# Patient Record
Sex: Female | Born: 1975
Health system: Southern US, Community
[De-identification: ages and names within clinical notes are randomized; demographics above are authoritative.]

## PROBLEM LIST (undated history)

## (undated) DIAGNOSIS — N939 Abnormal uterine and vaginal bleeding, unspecified: Secondary | ICD-10-CM

## (undated) DIAGNOSIS — J189 Pneumonia, unspecified organism: Secondary | ICD-10-CM

## (undated) DIAGNOSIS — B20 Human immunodeficiency virus [HIV] disease: Secondary | ICD-10-CM

## (undated) DIAGNOSIS — R7303 Prediabetes: Secondary | ICD-10-CM

## (undated) DIAGNOSIS — D649 Anemia, unspecified: Secondary | ICD-10-CM

## (undated) DIAGNOSIS — Z21 Asymptomatic human immunodeficiency virus [HIV] infection status: Secondary | ICD-10-CM

## (undated) DIAGNOSIS — N9489 Other specified conditions associated with female genital organs and menstrual cycle: Secondary | ICD-10-CM

## (undated) HISTORY — DX: Other specified conditions associated with female genital organs and menstrual cycle: N94.89

## (undated) HISTORY — DX: Asymptomatic human immunodeficiency virus (hiv) infection status: Z21

## (undated) HISTORY — DX: Prediabetes: R73.03

## (undated) HISTORY — DX: Abnormal uterine and vaginal bleeding, unspecified: N93.9

## (undated) HISTORY — PX: ECTOPIC PREGNANCY SURGERY: SHX613

## (undated) HISTORY — DX: Human immunodeficiency virus (HIV) disease: B20

## (undated) HISTORY — PX: TUBAL LIGATION: SHX77

---

## 2018-08-20 ENCOUNTER — Ambulatory Visit: Payer: Self-pay

## 2018-08-20 ENCOUNTER — Other Ambulatory Visit: Payer: Self-pay

## 2018-08-22 ENCOUNTER — Other Ambulatory Visit: Payer: Self-pay

## 2018-08-22 ENCOUNTER — Ambulatory Visit: Payer: Self-pay

## 2018-08-22 DIAGNOSIS — Z79899 Other long term (current) drug therapy: Secondary | ICD-10-CM

## 2018-08-22 DIAGNOSIS — Z113 Encounter for screening for infections with a predominantly sexual mode of transmission: Secondary | ICD-10-CM

## 2018-08-22 DIAGNOSIS — B20 Human immunodeficiency virus [HIV] disease: Secondary | ICD-10-CM

## 2018-08-27 ENCOUNTER — Other Ambulatory Visit: Payer: Self-pay

## 2018-08-27 ENCOUNTER — Ambulatory Visit: Payer: Self-pay

## 2018-08-27 DIAGNOSIS — B20 Human immunodeficiency virus [HIV] disease: Secondary | ICD-10-CM

## 2018-08-27 DIAGNOSIS — Z79899 Other long term (current) drug therapy: Secondary | ICD-10-CM

## 2018-08-27 DIAGNOSIS — Z113 Encounter for screening for infections with a predominantly sexual mode of transmission: Secondary | ICD-10-CM

## 2018-08-28 LAB — T-HELPER CELL (CD4) - (RCID CLINIC ONLY)
CD4 % Helper T Cell: 12 % — ABNORMAL LOW (ref 33–55)
CD4 T Cell Abs: 380 /uL — ABNORMAL LOW (ref 400–2700)

## 2018-08-29 ENCOUNTER — Encounter: Payer: Self-pay | Admitting: Family

## 2018-09-05 LAB — CBC WITH DIFFERENTIAL/PLATELET
Absolute Monocytes: 270 cells/uL (ref 200–950)
Basophils Absolute: 20 cells/uL (ref 0–200)
Basophils Relative: 0.4 %
Eosinophils Absolute: 39 cells/uL (ref 15–500)
Eosinophils Relative: 0.8 %
HCT: 34.9 % — ABNORMAL LOW (ref 35.0–45.0)
Hemoglobin: 12 g/dL (ref 11.7–15.5)
Lymphs Abs: 3210 cells/uL (ref 850–3900)
MCH: 29.7 pg (ref 27.0–33.0)
MCHC: 34.4 g/dL (ref 32.0–36.0)
MCV: 86.4 fL (ref 80.0–100.0)
MPV: 11.6 fL (ref 7.5–12.5)
Monocytes Relative: 5.5 %
Neutro Abs: 1362 cells/uL — ABNORMAL LOW (ref 1500–7800)
Neutrophils Relative %: 27.8 %
Platelets: 169 10*3/uL (ref 140–400)
RBC: 4.04 10*6/uL (ref 3.80–5.10)
RDW: 13.2 % (ref 11.0–15.0)
Total Lymphocyte: 65.5 %
WBC: 4.9 10*3/uL (ref 3.8–10.8)

## 2018-09-05 LAB — QUANTIFERON-TB GOLD PLUS
Mitogen-NIL: 6.71 IU/mL
NIL: 0.12 IU/mL
QuantiFERON-TB Gold Plus: NEGATIVE
TB1-NIL: 0.03 IU/mL
TB2-NIL: 0.05 IU/mL

## 2018-09-05 LAB — COMPLETE METABOLIC PANEL WITH GFR
AG Ratio: 0.9 (calc) — ABNORMAL LOW (ref 1.0–2.5)
ALBUMIN MSPROF: 3.5 g/dL — AB (ref 3.6–5.1)
ALKALINE PHOSPHATASE (APISO): 49 U/L (ref 31–125)
ALT: 19 U/L (ref 6–29)
AST: 24 U/L (ref 10–30)
BUN: 8 mg/dL (ref 7–25)
CHLORIDE: 107 mmol/L (ref 98–110)
CO2: 26 mmol/L (ref 20–32)
Calcium: 9 mg/dL (ref 8.6–10.2)
Creat: 0.86 mg/dL (ref 0.50–1.10)
GFR, Est African American: 96 mL/min/{1.73_m2} (ref >=60)
GFR, Est Non African American: 83 mL/min/{1.73_m2} (ref >=60)
Globulin: 3.7 g/dL (calc) (ref 1.9–3.7)
Glucose, Bld: 94 mg/dL (ref 65–99)
Potassium: 3.7 mmol/L (ref 3.5–5.3)
Sodium: 140 mmol/L (ref 135–146)
Total Bilirubin: 0.3 mg/dL (ref 0.2–1.2)
Total Protein: 7.2 g/dL (ref 6.1–8.1)

## 2018-09-05 LAB — HLA B*5701: HLA-B*5701 w/rflx HLA-B High: NEGATIVE

## 2018-09-05 LAB — LIPID PANEL
Cholesterol: 180 mg/dL (ref <200)
HDL: 38 mg/dL — ABNORMAL LOW (ref >=50)
LDL Cholesterol (Calc): 105 mg/dL (calc) — ABNORMAL HIGH
Non-HDL Cholesterol (Calc): 142 mg/dL (calc) — ABNORMAL HIGH (ref <130)
Total CHOL/HDL Ratio: 4.7 (calc) (ref <5.0)
Triglycerides: 243 mg/dL — ABNORMAL HIGH (ref <150)

## 2018-09-05 LAB — RPR: RPR Ser Ql: NONREACTIVE

## 2018-09-05 LAB — HEPATITIS C ANTIBODY
Hepatitis C Ab: NONREACTIVE
SIGNAL TO CUT-OFF: 0.11 (ref <1.00)

## 2018-09-05 LAB — HIV-1/2 AB - DIFFERENTIATION
HIV 1 ANTIBODY: POSITIVE — AB
HIV-2 Ab: NEGATIVE

## 2018-09-05 LAB — HIV ANTIBODY (ROUTINE TESTING W REFLEX): HIV 1&2 Ab, 4th Generation: REACTIVE — AB

## 2018-09-05 LAB — HIV-1 RNA ULTRAQUANT REFLEX TO GENTYP+
HIV 1 RNA Quant: 214000 copies/mL — ABNORMAL HIGH
HIV-1 RNA Quant, Log: 5.33 Log copies/mL — ABNORMAL HIGH

## 2018-09-05 LAB — HEPATITIS B CORE ANTIBODY, TOTAL: Hep B Core Total Ab: NONREACTIVE

## 2018-09-05 LAB — HEPATITIS A ANTIBODY, TOTAL: HEPATITIS A AB,TOTAL: NONREACTIVE

## 2018-09-05 LAB — HEPATITIS B SURFACE ANTIGEN: Hepatitis B Surface Ag: NONREACTIVE

## 2018-09-05 LAB — HEPATITIS B SURFACE ANTIBODY,QUALITATIVE: Hep B S Ab: NONREACTIVE

## 2018-09-05 LAB — HIV-1 GENOTYPE: HIV-1 Genotype: DETECTED — AB

## 2018-09-13 ENCOUNTER — Encounter: Payer: Self-pay | Admitting: Family

## 2018-09-13 ENCOUNTER — Ambulatory Visit: Payer: Self-pay | Admitting: Pharmacist

## 2018-09-13 ENCOUNTER — Ambulatory Visit (INDEPENDENT_AMBULATORY_CARE_PROVIDER_SITE_OTHER): Payer: Self-pay | Admitting: Pharmacist

## 2018-09-13 ENCOUNTER — Ambulatory Visit (INDEPENDENT_AMBULATORY_CARE_PROVIDER_SITE_OTHER): Payer: Self-pay | Admitting: Family

## 2018-09-13 ENCOUNTER — Telehealth: Payer: Self-pay | Admitting: Pharmacy Technician

## 2018-09-13 VITALS — BP 130/83 | HR 98 | Temp 98.5°F | Ht 70.0 in | Wt 253.0 lb

## 2018-09-13 DIAGNOSIS — B20 Human immunodeficiency virus [HIV] disease: Secondary | ICD-10-CM

## 2018-09-13 DIAGNOSIS — Z0000 Encounter for general adult medical examination without abnormal findings: Secondary | ICD-10-CM

## 2018-09-13 MED ORDER — BICTEGRAVIR-EMTRICITAB-TENOFOV 50-200-25 MG PO TABS: 1.0000 | ORAL_TABLET | Freq: Every day | ORAL | 2 refills | Status: DC

## 2018-09-13 MED FILL — BIKTARVY 50-200-25 MG TABS: 50-200-25 | 30 days supply | Qty: 30 | Fill #0

## 2018-09-13 NOTE — Assessment & Plan Note (Signed)
   Obtain old records for immunizations.   Discussed importance of safe sexual practice to reduce risk of acquisition and transmission of STI.  Encouraged to establish with Primary Care for multiple medical problems.

## 2018-09-13 NOTE — Progress Notes (Signed)
HPI: Tiffany Ray is a 43 y.o. female who presents to the [place] clinic today to initiate care with [name] for her HIV infection.  There are no active problems to display for this patient.   Patient's Medications  New Prescriptions   BICTEGRAVIR-EMTRICITABINE-TENOFOVIR AF (BIKTARVY) 50-200-25 MG TABS TABLET    Take 1 tablet by mouth daily.  Previous Medications   No medications on file  Modified Medications   No medications on file  Discontinued Medications   No medications on file    Allergies: No Known Allergies  Past Medical History: Past Medical History:  Diagnosis Date  . HIV infection [facility])     Social History: Social History   Socioeconomic History  . Marital status: Single    Spouse name: Not on file  . Number of children: 4  . Years of education: 75  . Highest education level: Not on file  Occupational History  . Occupation: Unemployed  Social Needs  . Financial resource strain: Not on file  . Food insecurity:    Worry: Not on file    Inability: Not on file  . Transportation needs:    Medical: Not on file    Non-medical: Not on file  Tobacco Use  . Smoking status: Current Every Day Smoker    Packs/day: 0.50    Types: Cigarettes  . Smokeless tobacco: Never Used  Substance and Sexual Activity  . Alcohol use: Never    Frequency: Never  . Drug use: Never  . Sexual activity: Not on file  Lifestyle  . Physical activity:    Days per week: Not on file    Minutes per session: Not on file  . Stress: Not on file  Relationships  . Social connections:    Talks on phone: Not on file    Gets together: Not on file    Attends religious service: Not on file    Active member of club or organization: Not on file    Attends meetings of clubs or organizations: Not on file    Relationship status: Not on file  Other Topics Concern  . Not on file  Social History Narrative  . Not on file    Labs: Lab Results  Component Value Date   HIV1RNAQUANT 214,000 (H)  08/27/2018   CD4TABS 380 (L) 08/27/2018    RPR and STI Lab Results  Component Value Date   LABRPR NON-REACTIVE 08/27/2018    No flowsheet data found.  Hepatitis B Lab Results  Component Value Date   HEPBSAB NON-REACTIVE 08/27/2018   HEPBSAG NON-REACTIVE 08/27/2018   HEPBCAB NON-REACTIVE 08/27/2018   Hepatitis C Lab Results  Component Value Date   HEPCAB NON-REACTIVE 08/27/2018   Hepatitis A Lab Results  Component Value Date   HAV NON-REACTIVE 08/27/2018   Lipids: Lab Results  Component Value Date   CHOL 180 08/27/2018   TRIG 243 (H) 08/27/2018   HDL 38 (L) 08/27/2018   CHOLHDL 4.7 08/27/2018   LDLCALC 105 (H) 08/27/2018    Current HIV Regimen: Atripla  Assessment: Tiffany Ray is here today to initiate care with [name] for her HIV infection.  She has been on treatment before with Atripla but has been out for quite some time. She has a HIV viral load of 214,000 and a CD4 count of 380. 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 Will transition patient to [employer].  Explained that Tiffany Ray is a one pill once daily medication with or without food and  the importance of not missing any doses. Explained resistance and how it develops and why it is so important to take Biktarvy daily and not skip days or doses. Counseled patient to take it around the same time each day. Counseled on what to do if dose is missed, if closer to missed dose take immediately, if closer to next dose then skip and resume normal schedule.   Cautioned on possible side effects the first week or so including nausea, diarrhea, dizziness, and headaches but that they should resolve after the first couple of weeks. I reviewed patient medications and found no drug interactions. Counseled patient to separate Biktarvy from divalent cations including multivitamins. Discussed with patient to call clinic if she starts a new medication or herbal supplement.  She states that she is  supposed to have Medicaid but it is not active.  Adonis Brook signed her up for [place] advancing access and she will fill at [facility] in the interim.   Plan: - Start Biktarvy PO once daily  [name] L. Raymond Azure, PharmD, BCIDP, AAHIVP, [place] for Infectious Disease 09/13/2018, 11:39 AM

## 2018-09-13 NOTE — Telephone Encounter (Signed)
[facility] Patient Advocate Encounter   Patient has been approved for [employer] Advancing Access Patient Assistance Program for [place]  for a one time 30-day fill.  This assistance will make the patient's copay $0.  I have spoken with the patient and they will pick up medication at [place]. The [employer] billing information is as follows Member ID: [id number] [place]: [id number] PCN: [id number] Group: [id number]  This will allow the patient to receive her medication today.  She picked up at the pharmacy today following the appointment. Her medicaid will be active 09/15/2018. Patient aware and will continue to fill at [facility] and have her medications mailed to the home address.  Patient knows to call the office with questions or concerns.  Bartholomew Crews, [name] Specialty Pharmacy Patient [facility] for Infectious Disease Phone: [phone] Fax: [phone] 09/13/2018 12:03 PM

## 2018-09-13 NOTE — Telephone Encounter (Signed)
[facility] Patient Advocate Encounter    Findings of the benefits investigation conducted this morning via test claims for patient's appointment 09/13/2018 at 09:00am are as follows:   Insurance: no active coverage was able to be found Pharmacist, community, Medicare or [place] Medicaid at this time Estimated copay amount: $to be determined if patient has alternative coverage  Prior Authorization: not required at this time  [facility] Patient Advocate will follow up once patient arrives for their appointment. Will enroll patient in uninsured patient assistance if needed at the time of appointment  Bartholomew Crews, [place] Patient [facility] for Infectious Disease Phone: [phone] Fax: [phone] 09/13/2018 8:29 AM

## 2018-09-13 NOTE — Progress Notes (Signed)
Subjective:    Patient ID: Tiffany Ray, female    DOB: 01-10-1976, 43 y.o.   MRN: [id number]  Chief Complaint  Patient presents with  . HIV Positive/AIDS    HPI:  Tiffany Ray is a 43 y.o. female who presents today to transfer/establish care for HIV disease previously treated in [place], [place].  Initial clinic blood work completed on 08/27/18 shows a viral load of 214,000 with a CD4 count of 380. 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 HLA-[id number] and Quantiferon GOLD negative. RPR non-reactive. No infection with Hepatitis B or C and no serological immunity to Hepatitis A or B. Kidney function, liver function and electrolytes within normal ranges. Lipid profile with LDL of 105, HDL 38 and triglycerides of 243.   Tiffany Ray was initially diagnosed with HIV in 2009/10 and has been treated in [place], [place] until October 2019 when she moved to [place]. Previous ART regimen was Atripla which she has not taken since October. She attempted to contact her previous pharmacy but unable to mail medications to [facility]. She is now here to establish care and restart medication. She is feeling okay but she does have arthralgias and per her history has been diagnosed with arthritis in the past.   Tiffany Ray was previously covered with Medicaid which is no longer active and has no current insurance. UMAP was denied because at the time of application she had Medicaid. She plans on speaking with the Medicaid office today. She is not currently working and actively seeking employment. She is living with her children in stable housing.    No Known Allergies    No outpatient medications prior to visit.   No facility-administered medications prior to visit.      Past Medical History:  Diagnosis Date  . HIV infection ([place])       History reviewed. No pertinent surgical history.    History reviewed. No pertinent family history.    Social  History   Socioeconomic History  . Marital status: Single    Spouse name: Not on file  . Number of children: 4  . Years of education: 27  . Highest education level: Not on file  Occupational History  . Occupation: Unemployed  Social Needs  . Financial resource strain: Not on file  . Food insecurity:    Worry: Not on file    Inability: Not on file  . Transportation needs:    Medical: Not on file    Non-medical: Not on file  Tobacco Use  . Smoking status: Current Every Day Smoker    Packs/day: 0.50    Types: Cigarettes  . Smokeless tobacco: Never Used  Substance and Sexual Activity  . Alcohol use: Never    Frequency: Never  . Drug use: Never  . Sexual activity: Not on file  Lifestyle  . Physical activity:    Days per week: Not on file    Minutes per session: Not on file  . Stress: Not on file  Relationships  . Social connections:    Talks on phone: Not on file    Gets together: Not on file    Attends religious service: Not on file    Active member of club or organization: Not on file    Attends meetings of clubs or organizations: Not on file    Relationship status: Not on file  . Intimate partner violence:    Fear of current or ex partner: Not on file  Emotionally abused: Not on file    Physically abused: Not on file    Forced sexual activity: Not on file  Other Topics Concern  . Not on file  Social History Narrative  . Not on file      Review of Systems  Constitutional: Negative for appetite change, chills, diaphoresis, fatigue, fever and unexpected weight change.  Eyes:       Negative for acute change in vision  Respiratory: Negative for chest tightness, shortness of breath and wheezing.   Cardiovascular: Negative for chest pain.  Gastrointestinal: Negative for diarrhea, nausea and vomiting.  Genitourinary: Negative for dysuria, pelvic pain and vaginal discharge.  Musculoskeletal: Negative for neck pain and neck stiffness.  Skin: Negative for rash.    Neurological: Negative for seizures, syncope, weakness and headaches.  Hematological: Negative for adenopathy. Does not bruise/bleed easily.  Psychiatric/Behavioral: Negative for hallucinations.       Objective:    BP 130/83   Pulse 98   Temp 98.5 F (36.9 C) (Oral)   Ht 5' 10"  (1.778 m)   Wt 253 lb (114.8 kg)   LMP 09/06/2018   BMI 36.30 kg/m  Nursing note and vital signs reviewed.  Physical Exam Constitutional:      General: She is not in acute distress.    Appearance: She is well-developed. She is obese.  Eyes:     Conjunctiva/sclera: Conjunctivae normal.  Neck:     Musculoskeletal: Neck supple.  Cardiovascular:     Rate and Rhythm: Normal rate and regular rhythm.     Heart sounds: Normal heart sounds. No murmur. No friction rub. No gallop.   Pulmonary:     Effort: Pulmonary effort is normal. No respiratory distress.     Breath sounds: Normal breath sounds. No wheezing or rales.  Chest:     Chest wall: No tenderness.  Abdominal:     General: Bowel sounds are normal.     Palpations: Abdomen is soft.     Tenderness: There is no abdominal tenderness.  Lymphadenopathy:     Cervical: No cervical adenopathy.  Skin:    General: Skin is warm and dry.     Findings: No rash.  Neurological:     Mental Status: She is alert.  Psychiatric:        Behavior: Behavior normal.        Thought Content: Thought content normal.        Judgment: Judgment normal.     Depression screen PHQ 2/9 09/13/2018  Decreased Interest 0  Down, Depressed, Hopeless 0  PHQ - 2 Score 0       Assessment & Plan:   Problem List Items Addressed This Visit      Other   HIV disease ([place]) - Primary    Tiffany Ray has poorly controlled HIV disease and not currently maintained on medication since arriving in [place] in October with her previous regimen of Atripla. 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 She has no signs/symptoms of opportunistic infection or progressive  HIV disease at present. Discussed importance of taking medication to reduce risk of progressive HIV disease in the future. We were able to obtain medication assistance and will start her on Biktarvy while Medicaid gets sorted out and may still need to reapply for UMAP. Will obtain additional copies of old records for review of immunizations and previous treatments. She was able to meet with our pharmacy staff today. Will plan to follow up in 1 month or  sooner if needed to recheck viral load.       Relevant Medications   bictegravir-emtricitabine-tenofovir AF (BIKTARVY) 50-200-25 MG TABS tablet   Healthcare maintenance     Obtain old records for immunizations.   Discussed importance of safe sexual practice to reduce risk of acquisition and transmission of STI.  Encouraged to establish with Primary Care for multiple medical problems.           I am having Kizzie Ide start on bictegravir-emtricitabine-tenofovir AF.   Meds ordered this encounter  Medications  . bictegravir-emtricitabine-tenofovir AF (BIKTARVY) 50-200-25 MG TABS tablet    Sig: Take 1 tablet by mouth daily.    Dispense:  30 tablet    Refill:  2    Order Specific Question:   Supervising Provider    Answer:   Carlyle Basques [[id number]]     Follow-up: Return in about 1 month (around 10/12/2018), or if symptoms worsen or fail to improve.    Terri Piedra, MSN, FNP-C Nurse Practitioner [facility] for Infectious Disease [place] [facility] Office phone: [phone] Pager: [place] number: [phone]

## 2018-09-13 NOTE — Patient Instructions (Signed)
Nice to meet you!  We will work on getting you started on [place] today.  Plan for follow up office visit in 1 month or sooner if needed with lab work done at that appointment.  We will work to obtain your records from your previous provider.

## 2018-09-13 NOTE — Assessment & Plan Note (Signed)
Tiffany Ray has poorly controlled HIV disease and not currently maintained on medication since arriving in [place] in October with her previous regimen of Atripla. 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 She has no signs/symptoms of opportunistic infection or progressive HIV disease at present. Discussed importance of taking medication to reduce risk of progressive HIV disease in the future. We were able to obtain medication assistance and will start her on Biktarvy while Medicaid gets sorted out and may still need to reapply for UMAP. Will obtain additional copies of old records for review of immunizations and previous treatments. She was able to meet with our pharmacy staff today. Will plan to follow up in 1 month or sooner if needed to recheck viral load.

## 2018-09-16 NOTE — Telephone Encounter (Signed)
[place] Medicaid is now in active status as of 09/15/2018.  Test claim on 03/02/020 confirmed this information.  Patient would like to continue filling medication at the [facility] and have the medication mailed to her home address for her next refill.

## 2018-10-08 MED FILL — BIKTARVY 50-200-25 MG TABS: 50-200-25 | 30 days supply | Qty: 30 | Fill #1

## 2018-10-11 ENCOUNTER — Ambulatory Visit: Payer: Self-pay | Admitting: Family

## 2018-11-04 ENCOUNTER — Telehealth: Payer: Self-pay | Admitting: Family

## 2018-11-04 NOTE — Telephone Encounter (Signed)
COVID-19 Pre-Screening Questions: ° °Do you currently have a fever (>100 °F), chills or unexplained body aches? no  ° °Are you currently experiencing new cough, shortness of breath, sore throat, runny nose?no  °•  °Have you recently travelled outside the state of [place] in the last 14 days? no °•  °Have you been in contact with someone that is currently pending confirmation of Covid19 testing or has been confirmed to have the Covid19 virus? no °

## 2018-11-05 ENCOUNTER — Ambulatory Visit: Payer: Medicaid Other | Admitting: Family

## 2018-11-07 ENCOUNTER — Other Ambulatory Visit: Payer: Self-pay

## 2018-11-07 ENCOUNTER — Encounter: Payer: Self-pay | Admitting: Family

## 2018-11-07 ENCOUNTER — Ambulatory Visit (INDEPENDENT_AMBULATORY_CARE_PROVIDER_SITE_OTHER): Payer: Medicaid Other | Admitting: Family

## 2018-11-07 VITALS — BP 142/94 | HR 83 | Temp 97.8°F | Wt 249.0 lb

## 2018-11-07 DIAGNOSIS — B20 Human immunodeficiency virus [HIV] disease: Secondary | ICD-10-CM | POA: Diagnosis not present

## 2018-11-07 MED ORDER — DARUN-COBIC-EMTRICIT-TENOFAF 800-150-200-10 MG PO TABS: 1.0000 | ORAL_TABLET | Freq: Every day | ORAL | 2 refills | Status: DC

## 2018-11-07 NOTE — Progress Notes (Signed)
Subjective:    Patient ID: Tiffany Ray, female    DOB: [birth year], 43 y.o.   MRN: [id number]  Chief Complaint  Patient presents with  . HIV Positive/AIDS     HPI:  Tiffany Ray is a 43 y.o. female with HIV disease who was last seen on 09/13/18 to transfer care with poorly controlled HIV disease as she had not been taking her medication for several months. 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 She was started on Biktarvy at that time. Her viral load was found to be 214,000 with CD4 count of 380.  Tiffany Ray has been taking her Biktarvy as prescribed with no missed doses.  She has noticed since starting the medication that she has lost weight and and has a decreased ability to sleep describing being up until 5 AM every morning.  Overall she feels okay. Denies fevers, chills, night sweats, headaches, changes in vision, neck pain/stiffness, nausea, diarrhea, vomiting, lesions or rashes.  Tiffany Ray has coverage through Medicare and is currently receiving her medications from [facility] without problems.  She remains unemployed and living with family.  Denies recreational or illicit drug use.  Denies feelings of being down, depressed, or hopeless recently.  Not currently sexually active.  No Known Allergies    Outpatient Medications Prior to Visit  Medication Sig Dispense Refill  . bictegravir-emtricitabine-tenofovir AF (BIKTARVY) 50-200-25 MG TABS tablet Take 1 tablet by mouth daily. 30 tablet 2   No facility-administered medications prior to visit.      Past Medical History:  Diagnosis Date  . HIV infection ([place])      History reviewed. No pertinent surgical history.   Review of Systems  Constitutional: Negative for appetite change, chills, diaphoresis, fatigue, fever and unexpected weight change.  Eyes:       Negative for acute change in vision  Respiratory: Negative for chest tightness, shortness of  breath and wheezing.   Cardiovascular: Negative for chest pain.  Gastrointestinal: Negative for diarrhea, nausea and vomiting.  Genitourinary: Negative for dysuria, pelvic pain and vaginal discharge.  Musculoskeletal: Negative for neck pain and neck stiffness.  Skin: Negative for rash.  Neurological: Negative for seizures, syncope, weakness and headaches.  Hematological: Negative for adenopathy. Does not bruise/bleed easily.  Psychiatric/Behavioral: Negative for hallucinations.      Objective:    BP (!) 142/94   Pulse 83   Temp 97.8 F (36.6 C)   Wt 249 lb (112.9 kg)   BMI 35.73 kg/m  Nursing note and vital signs reviewed.  Physical Exam Constitutional:      General: She is not in acute distress.    Appearance: She is well-developed.     Comments: Seated in the chair; pleasant  Eyes:     Conjunctiva/sclera: Conjunctivae normal.  Neck:     Musculoskeletal: Neck supple.  Cardiovascular:     Rate and Rhythm: Normal rate and regular rhythm.     Heart sounds: Normal heart sounds. No murmur. No friction rub. No gallop.   Pulmonary:     Effort: Pulmonary effort is normal. No respiratory distress.     Breath sounds: Normal breath sounds. No wheezing or rales.  Chest:     Chest wall: No tenderness.  Abdominal:     General: Bowel sounds are normal.     Palpations: Abdomen is soft.     Tenderness: There is no abdominal tenderness.  Lymphadenopathy:     Cervical: No  cervical adenopathy.  Skin:    General: Skin is warm and dry.     Findings: No rash.  Neurological:     Mental Status: She is alert.  Psychiatric:        Mood and Affect: Mood normal.        Assessment & Plan:   Problem List Items Addressed This Visit      Other   HIV disease ([place]) - Primary    Tiffany Ray appears to have good adherence with her ART regimen of Biktarvy despite the side effect of her decreased ability to sleep and weight loss.  Tiffany Ray has been known to cause insomnia in 2% of the  population. She has no signs/symptoms of opportunistic infection or progressive HIV disease at present.  We will check blood work today.  Discontinue Biktarvy.  Start Symtuza.  Plan for follow-up in 1 month or sooner if needed with lab work following appointment.        Relevant Medications   Darunavir-Cobicisctat-Emtricitabine-Tenofovir Alafenamide (SYMTUZA) 800-150-200-10 MG TABS   Other Relevant Orders   HIV-1 RNA ultraquant reflex to gentyp+   T-helper cell (CD4)- ([facility] clinic only)       I have discontinued Tiffany Ray's bictegravir-emtricitabine-tenofovir AF. I am also having her start on Darunavir-Cobicisctat-Emtricitabine-Tenofovir Alafenamide.   Meds ordered this encounter  Medications  . Darunavir-Cobicisctat-Emtricitabine-Tenofovir Alafenamide (SYMTUZA) 800-150-200-10 MG TABS    Sig: Take 1 tablet by mouth daily with breakfast.    Dispense:  30 tablet    Refill:  2    Discontinue Biktarvy    Order Specific Question:   Supervising Provider    Answer:   Carlyle Basques [[id number]]     Follow-up: Return in about 1 month (around 12/07/2018), or if symptoms worsen or fail to improve.   Terri Piedra, MSN, FNP-C Nurse Practitioner [facility] for Infectious Disease [place] number: [phone]

## 2018-11-07 NOTE — Assessment & Plan Note (Addendum)
Tiffany Ray appears to have good adherence with her ART regimen of Biktarvy despite the side effect of her decreased ability to sleep and weight loss.  Tiffany Ray has been known to cause insomnia in 2% of the population. She has no signs/symptoms of opportunistic infection or progressive HIV disease at present.  We will check blood work today.  Discontinue Biktarvy.  Start Symtuza.  Plan for follow-up in 1 month or sooner if needed with lab work following appointment.

## 2018-11-07 NOTE — Patient Instructions (Signed)
Nice to see you.  We will check your blood work today.  Please STOP taking Biktarvy when you get the [facility].  Start taking Symtuza daily.  We will plan to follow up in 1 month or sooner if needed.

## 2018-11-08 LAB — T-HELPER CELL (CD4) - (RCID CLINIC ONLY)
CD4 % Helper T Cell: 23 % — ABNORMAL LOW (ref 33–55)
CD4 T Cell Abs: 400 /uL (ref 400–2700)

## 2018-11-11 LAB — HIV-1 RNA ULTRAQUANT REFLEX TO GENTYP+
HIV 1 RNA Quant: 47 copies/mL — ABNORMAL HIGH
HIV-1 RNA Quant, Log: 1.67 Log copies/mL — ABNORMAL HIGH

## 2018-11-11 MED FILL — SYMTUZA 800-150-200-10 MG T: 800-150-200 | 30 days supply | Qty: 30 | Fill #0

## 2018-12-23 ENCOUNTER — Other Ambulatory Visit: Payer: Self-pay

## 2018-12-23 ENCOUNTER — Ambulatory Visit (INDEPENDENT_AMBULATORY_CARE_PROVIDER_SITE_OTHER): Payer: Medicaid Other | Admitting: Family

## 2018-12-23 ENCOUNTER — Encounter: Payer: Self-pay | Admitting: Family

## 2018-12-23 VITALS — BP 119/84 | HR 83 | Temp 98.3°F | Wt 251.0 lb

## 2018-12-23 DIAGNOSIS — B20 Human immunodeficiency virus [HIV] disease: Secondary | ICD-10-CM | POA: Diagnosis not present

## 2018-12-23 DIAGNOSIS — Z0000 Encounter for general adult medical examination without abnormal findings: Secondary | ICD-10-CM

## 2018-12-23 DIAGNOSIS — Z1231 Encounter for screening mammogram for malignant neoplasm of breast: Secondary | ICD-10-CM | POA: Diagnosis not present

## 2018-12-23 MED ORDER — DARUN-COBIC-EMTRICIT-TENOFAF 800-150-200-10 MG PO TABS: 1.0000 | ORAL_TABLET | Freq: Every day | ORAL | 5 refills | Status: DC

## 2018-12-23 NOTE — Assessment & Plan Note (Signed)
   Awaiting immunization results from previous provider.  Pap smear will be scheduled with cervical cancer screening clinic  Order for mammogram placed for breast cancer screening.  Discussed importance of safe sexual practice to reduce risk of transmission/acquisition of STI.  Dental screening on hold pending coronavirus pandemic

## 2018-12-23 NOTE — Patient Instructions (Signed)
Nice to see you.  We will check your blood work today.  Continue to take your Symtuza as prescribed.  We will call with your blood work results.   Please make an appointment with Janene Madeira, NP for a Pap Smear.   We will get you referred for mammogram.   Plan for follow up in 4 months or sooner if needed with lab work 1-2 weeks prior to appointment.

## 2018-12-23 NOTE — Assessment & Plan Note (Signed)
Ms. Spacek appears to be doing well with the change of her ART regimen to [place] with good adherence and tolerance.  No signs/symptoms of opportunistic infection or progressive HIV disease at present.  Check blood work today.  Continue current dose of Symtuza.  Plan for follow-up in 4 months or sooner if needed with lab work 1 to 2 weeks prior to appointment.

## 2018-12-23 NOTE — Progress Notes (Signed)
Subjective:    Patient ID: Tiffany Ray, female    DOB: 03-Dec-1975, 43 y.o.   MRN: [id number]  Chief Complaint  Patient presents with  . HIV Positive/AIDS     HPI:  Tiffany Ray is a 43 y.o. female with HIV disease who was last seen in the office on 11/07/18 with good adherence to her ART regimen of Biktarvy but noted that it was causing her lose weight and sleep. She was changed to [employer]. No recent blood work has been completed. She is due for a mammogram, Pap smear, and dental screening.  Tiffany Ray has been taking her Symtuza as prescribed with no adverse side effects or missed doses. Overall she feels well today with no complaints. Denies fevers, chills, night sweats, headaches, changes in vision, neck pain/stiffness, nausea, diarrhea, vomiting, lesions or rashes.  Tiffany Ray has no problems obtaining her medications from [facility] and remains covered through [facility]. She is actively seeking employment. Denies any recent feelings of being down, depressed, or hopeless. She continues to live with her children. No recreational or illicit drug use. Not currently sexually active.    No Known Allergies    Outpatient Medications Prior to Visit  Medication Sig Dispense Refill  . Darunavir-Cobicisctat-Emtricitabine-Tenofovir Alafenamide (SYMTUZA) 800-150-200-10 MG TABS Take 1 tablet by mouth daily with breakfast. 30 tablet 2   No facility-administered medications prior to visit.      Past Medical History:  Diagnosis Date  . HIV infection ([place])      History reviewed. No pertinent surgical history.   Review of Systems  Constitutional: Negative for appetite change, chills, diaphoresis, fatigue, fever and unexpected weight change.  Eyes:       Negative for acute change in vision  Respiratory: Negative for chest tightness, shortness of breath and wheezing.   Cardiovascular: Negative for chest pain.  Gastrointestinal: Negative for diarrhea, nausea and  vomiting.  Genitourinary: Negative for dysuria, pelvic pain and vaginal discharge.  Musculoskeletal: Negative for neck pain and neck stiffness.  Skin: Negative for rash.  Neurological: Negative for seizures, syncope, weakness and headaches.  Hematological: Negative for adenopathy. Does not bruise/bleed easily.  Psychiatric/Behavioral: Negative for hallucinations.      Objective:    BP 119/84   Pulse 83   Temp 98.3 F (36.8 C)   Wt 251 lb (113.9 kg)   BMI 36.01 kg/m  Nursing note and vital signs reviewed.  Physical Exam Constitutional:      General: She is not in acute distress.    Appearance: She is well-developed.  Eyes:     Conjunctiva/sclera: Conjunctivae normal.  Neck:     Musculoskeletal: Neck supple.  Cardiovascular:     Rate and Rhythm: Normal rate and regular rhythm.     Heart sounds: Normal heart sounds. No murmur. No friction rub. No gallop.   Pulmonary:     Effort: Pulmonary effort is normal. No respiratory distress.     Breath sounds: Normal breath sounds. No wheezing or rales.  Chest:     Chest wall: No tenderness.  Abdominal:     General: Bowel sounds are normal.     Palpations: Abdomen is soft.     Tenderness: There is no abdominal tenderness.  Lymphadenopathy:     Cervical: No cervical adenopathy.  Skin:    General: Skin is warm and dry.     Findings: No rash.  Neurological:     Mental Status: She is alert and oriented to person, place, and time.  Psychiatric:        Behavior: Behavior normal.        Thought Content: Thought content normal.        Judgment: Judgment normal.        Assessment & Plan:   Problem List Items Addressed This Visit      Other   HIV disease ([place]) - Primary    Tiffany Ray appears to be doing well with the change of her ART regimen to [place] with good adherence and tolerance.  No signs/symptoms of opportunistic infection or progressive HIV disease at present.  Check blood work today.  Continue current dose of Symtuza.   Plan for follow-up in 4 months or sooner if needed with lab work 1 to 2 weeks prior to appointment.      Relevant Medications   Darunavir-Cobicisctat-Emtricitabine-Tenofovir Alafenamide (SYMTUZA) 800-150-200-10 MG TABS   Other Relevant Orders   CBC   COMPLETE METABOLIC PANEL WITH GFR   HIV-1 RNA quant-no reflex-bld   T-helper cell (CD4)- ([facility] clinic only)   Healthcare maintenance     Awaiting immunization results from previous provider.  Pap smear will be scheduled with cervical cancer screening clinic  Order for mammogram placed for breast cancer screening.  Discussed importance of safe sexual practice to reduce risk of transmission/acquisition of STI.  Dental screening on hold pending coronavirus pandemic       Other Visit Diagnoses    Encounter for screening mammogram for breast cancer       Relevant Orders   MM DIGITAL SCREENING BILATERAL      I am having Tiffany Ray maintain her Darunavir-Cobicisctat-Emtricitabine-Tenofovir Alafenamide.   Meds ordered this encounter  Medications  . Darunavir-Cobicisctat-Emtricitabine-Tenofovir Alafenamide (SYMTUZA) 800-150-200-10 MG TABS    Sig: Take 1 tablet by mouth daily with breakfast.    Dispense:  30 tablet    Refill:  5    Order Specific Question:   Supervising Provider    Answer:   Carlyle Basques [[id number]]     Follow-up: Return in about 4 months (around 04/24/2019), or if symptoms worsen or fail to improve.   Terri Piedra, MSN, FNP-C Nurse Practitioner [facility] for [facility] Disease [place] number: [phone]

## 2018-12-24 LAB — T-HELPER CELL (CD4) - (RCID CLINIC ONLY)
CD4 % Helper T Cell: 29 % — ABNORMAL LOW (ref 33–65)
CD4 T Cell Abs: 416 /uL (ref 400–1790)

## 2019-01-03 LAB — HIV-1 RNA QUANT-NO REFLEX-BLD
HIV 1 RNA Quant: 35 copies/mL — ABNORMAL HIGH
HIV-1 RNA Quant, Log: 1.54 Log copies/mL — ABNORMAL HIGH

## 2019-01-03 LAB — COMPLETE METABOLIC PANEL WITH GFR
AG Ratio: 1.1 (calc) (ref 1.0–2.5)
ALT: 9 U/L (ref 6–29)
AST: 13 U/L (ref 10–30)
Albumin: 3.9 g/dL (ref 3.6–5.1)
Alkaline phosphatase (APISO): 52 U/L (ref 31–125)
BUN: 10 mg/dL (ref 7–25)
CO2: 28 mmol/L (ref 20–32)
Calcium: 9.3 mg/dL (ref 8.6–10.2)
Chloride: 103 mmol/L (ref 98–110)
Creat: 0.84 mg/dL (ref 0.50–1.10)
GFR, Est African American: 99 mL/min/{1.73_m2} (ref >=60)
GFR, Est Non African American: 85 mL/min/{1.73_m2} (ref >=60)
Globulin: 3.6 g/dL (calc) (ref 1.9–3.7)
Glucose, Bld: 95 mg/dL (ref 65–99)
Potassium: 4.2 mmol/L (ref 3.5–5.3)
Sodium: 136 mmol/L (ref 135–146)
Total Bilirubin: 0.4 mg/dL (ref 0.2–1.2)
Total Protein: 7.5 g/dL (ref 6.1–8.1)

## 2019-01-03 LAB — CBC
HCT: 36.9 % (ref 35.0–45.0)
Hemoglobin: 12.3 g/dL (ref 11.7–15.5)
MCH: 28.3 pg (ref 27.0–33.0)
MCHC: 33.3 g/dL (ref 32.0–36.0)
MCV: 85 fL (ref 80.0–100.0)
MPV: 11.3 fL (ref 7.5–12.5)
Platelets: 288 10*3/uL (ref 140–400)
RBC: 4.34 10*6/uL (ref 3.80–5.10)
RDW: 13.7 % (ref 11.0–15.0)
WBC: 3.9 10*3/uL (ref 3.8–10.8)

## 2019-01-06 ENCOUNTER — Telehealth: Payer: Self-pay

## 2019-01-06 NOTE — Telephone Encounter (Signed)
-----   Message from Golden Circle, [place] sent at 01/06/2019 11:41 AM EDT ----- Please inform Tiffany Ray that her viral load is undetectable and CD4 count is 416. Continue to take Symtuza and follow up as planned.

## 2019-01-06 NOTE — Telephone Encounter (Signed)
Patient made aware of lab results and reminded about f/u appointment.  Eugenia Mcalpine, LPN

## 2019-01-06 NOTE — Telephone Encounter (Signed)
-----   Message from Golden Circle, [place] sent at 01/06/2019 11:41 AM EDT ----- Please inform Tiffany Ray that her viral load is undetectable and CD4 count is 416. Continue to take Symtuza and follow up as planned.

## 2019-01-08 MED FILL — SYMTUZA 800-150-200-10 MG T: 800-150-200 | 30 days supply | Qty: 30 | Fill #1

## 2019-01-09 ENCOUNTER — Ambulatory Visit: Payer: Medicaid Other | Admitting: Infectious Diseases

## 2019-02-06 MED FILL — SYMTUZA 800-150-200-10 MG T: 800-150-200 | 30 days supply | Qty: 30 | Fill #2

## 2019-03-18 MED FILL — SYMTUZA 800-150-200-10 MG T: 800-150-200 | 30 days supply | Qty: 30 | Fill #0

## 2019-04-18 ENCOUNTER — Other Ambulatory Visit: Payer: Self-pay | Admitting: *Deleted

## 2019-04-18 DIAGNOSIS — B20 Human immunodeficiency virus [HIV] disease: Secondary | ICD-10-CM

## 2019-04-24 ENCOUNTER — Other Ambulatory Visit: Payer: Medicaid Other

## 2019-05-02 MED FILL — SYMTUZA 800-150-200-10 MG T: 800-150-200 | 30 days supply | Qty: 30 | Fill #1

## 2019-05-12 ENCOUNTER — Encounter: Payer: Medicaid Other | Admitting: Family

## 2019-05-13 ENCOUNTER — Other Ambulatory Visit: Payer: Self-pay

## 2019-05-13 ENCOUNTER — Encounter: Payer: Self-pay | Admitting: Family

## 2019-05-13 ENCOUNTER — Ambulatory Visit (INDEPENDENT_AMBULATORY_CARE_PROVIDER_SITE_OTHER): Payer: Medicaid Other | Admitting: Family

## 2019-05-13 VITALS — BP 122/75 | HR 84 | Temp 98.8°F | Wt 272.0 lb

## 2019-05-13 DIAGNOSIS — Z1231 Encounter for screening mammogram for malignant neoplasm of breast: Secondary | ICD-10-CM | POA: Diagnosis not present

## 2019-05-13 DIAGNOSIS — Z113 Encounter for screening for infections with a predominantly sexual mode of transmission: Secondary | ICD-10-CM | POA: Diagnosis not present

## 2019-05-13 DIAGNOSIS — Z23 Encounter for immunization: Secondary | ICD-10-CM | POA: Diagnosis not present

## 2019-05-13 DIAGNOSIS — B20 Human immunodeficiency virus [HIV] disease: Secondary | ICD-10-CM | POA: Diagnosis not present

## 2019-05-13 DIAGNOSIS — Z0000 Encounter for general adult medical examination without abnormal findings: Secondary | ICD-10-CM

## 2019-05-13 MED ORDER — SYMTUZA 800-150-200-10 MG PO TABS: 1.0000 | ORAL_TABLET | Freq: Every day | ORAL | 5 refills | Status: DC

## 2019-05-13 NOTE — Addendum Note (Signed)
Addended by: Lenore Cordia on: 05/13/2019 04:53 PM   Modules accepted: Orders

## 2019-05-13 NOTE — Patient Instructions (Signed)
Nice to see you.  We will check your blood work today.  A referral for mammogram will be sent.  Please schedule an appointment with Janene Madeira, NP for a Pap smear and cervical cancer screening at your convenience.  Please continue take your Symtuza as prescribed daily.  Refills have been sent to the pharmacy.  Plan for follow-up in 4 months or sooner if needed with lab work 1 to 2 weeks prior to appointment on the same day.  Have a happy birthday!

## 2019-05-13 NOTE — Progress Notes (Signed)
Subjective:    Patient ID: Tiffany Ray, female    DOB: [birth year], 43 y.o.   MRN: [id number]  Chief Complaint  Patient presents with  . HIV Positive/AIDS     HPI:  Tiffany Ray is a 43 y.o. female with HIV disease who was last seen in the office on 12/23/2018 with good adherence and tolerance to her ART regimen of Symtuza.  Viral load at the time was undetectable with CD4 count of 416.  Healthcare maintenance due includes mammogram for breast cancer screening, Pap smear for cervical cancer screening, influenza vaccination, and Menveo.  Tiffany Ray continues to take her Symtuza as prescribed with no adverse side effects or missed doses.  Overall feeling well today with no new concerns/complaints.  Unfortunately she lost her father to cancer over the last month and has been grieving appropriately. Denies fevers, chills, night sweats, headaches, changes in vision, neck pain/stiffness, nausea, diarrhea, vomiting, lesions or rashes.  Tiffany Ray continues to have coverage through [facility] and has no problems obtaining her medication from the pharmacy.  Denies feelings of being down, depressed, or hopeless recently despite her recent family loss.  Denies any recreational or illicit drug use.  Alcohol consumption is occasional and she continues to work on decreasing her tobacco smoking.  Not currently sexually active at present.   No Known Allergies    Outpatient Medications Prior to Visit  Medication Sig Dispense Refill  . Darunavir-Cobicisctat-Emtricitabine-Tenofovir Alafenamide (SYMTUZA) 800-150-200-10 MG TABS Take 1 tablet by mouth daily with breakfast. 30 tablet 5   No facility-administered medications prior to visit.      Past Medical History:  Diagnosis Date  . HIV infection ([place])      History reviewed. No pertinent surgical history.   Review of Systems  Constitutional: Negative for appetite change, chills, diaphoresis, fatigue, fever and unexpected weight change.  Eyes:        Negative for acute change in vision  Respiratory: Negative for chest tightness, shortness of breath and wheezing.   Cardiovascular: Negative for chest pain.  Gastrointestinal: Negative for diarrhea, nausea and vomiting.  Genitourinary: Negative for dysuria, pelvic pain and vaginal discharge.  Musculoskeletal: Negative for neck pain and neck stiffness.  Skin: Negative for rash.  Neurological: Negative for seizures, syncope, weakness and headaches.  Hematological: Negative for adenopathy. Does not bruise/bleed easily.  Psychiatric/Behavioral: Negative for hallucinations.      Objective:    BP 122/75   Pulse 84   Temp 98.8 F (37.1 C)   Wt 272 lb (123.4 kg)   BMI 39.03 kg/m  Nursing note and vital signs reviewed.  Physical Exam Constitutional:      General: She is not in acute distress.    Appearance: She is well-developed.  Eyes:     Conjunctiva/sclera: Conjunctivae normal.  Neck:     Musculoskeletal: Neck supple.  Cardiovascular:     Rate and Rhythm: Normal rate and regular rhythm.     Heart sounds: Normal heart sounds. No murmur. No friction rub. No gallop.   Pulmonary:     Effort: Pulmonary effort is normal. No respiratory distress.     Breath sounds: Normal breath sounds. No wheezing or rales.  Chest:     Chest wall: No tenderness.  Abdominal:     General: Bowel sounds are normal.     Palpations: Abdomen is soft.     Tenderness: There is no abdominal tenderness.  Lymphadenopathy:     Cervical: No cervical adenopathy.  Skin:    General:  Skin is warm and dry.     Findings: No rash.  Neurological:     Mental Status: She is alert and oriented to person, place, and time.  Psychiatric:        Behavior: Behavior normal.        Thought Content: Thought content normal.        Judgment: Judgment normal.      Depression screen [facility] 2/9 05/13/2019 11/07/2018 09/13/2018  Decreased Interest 0 0 0  Down, Depressed, Hopeless 0 0 0  PHQ - 2 Score 0 0 0        Assessment & Plan:    Patient Active Problem List   Diagnosis Date Noted  . HIV disease ([place]) 09/13/2018  . Healthcare maintenance 09/13/2018     Problem List Items Addressed This Visit      Other   HIV disease ([place])   Relevant Medications   Darunavir-Cobicisctat-Emtricitabine-Tenofovir Alafenamide (SYMTUZA) 800-150-200-10 MG TABS   Other Relevant Orders   COMPLETE METABOLIC PANEL WITH GFR   HIV-1 RNA quant-no reflex-bld   T-helper cell (CD4)- ([facility] clinic only)   Healthcare maintenance     Influenza and Menveo updated today.  Referral sent for mammogram and breast cancer screening  We will schedule appointment for Pap smear and cervical cancer screening.  Discussed importance of safe sexual practice to reduce risk of acquisition/transmission of STI.  Declines condoms.  Awaiting referral to [facility] for dental care.        Other Visit Diagnoses    Screening for STDs (sexually transmitted diseases)    -  Primary   Relevant Orders   RPR   Encounter for screening mammogram for breast cancer       Relevant Orders   MM Digital Screening       I am having Tiffany Ray maintain her Symtuza.   Meds ordered this encounter  Medications  . Darunavir-Cobicisctat-Emtricitabine-Tenofovir Alafenamide (SYMTUZA) 800-150-200-10 MG TABS    Sig: Take 1 tablet by mouth daily with breakfast.    Dispense:  30 tablet    Refill:  5    Order Specific Question:   Supervising Provider    Answer:   Carlyle Basques [[id number]]     Follow-up: Return in about 4 months (around 09/13/2019), or if symptoms worsen or fail to improve.   Terri Piedra, MSN, FNP-C Nurse Practitioner [facility] for Infectious Disease [place] number: [phone]

## 2019-05-13 NOTE — Assessment & Plan Note (Signed)
   Influenza and Menveo updated today.  Referral sent for mammogram and breast cancer screening  We will schedule appointment for Pap smear and cervical cancer screening.  Discussed importance of safe sexual practice to reduce risk of acquisition/transmission of STI.  Declines condoms.  Awaiting referral to [facility] for dental care.

## 2019-05-14 LAB — T-HELPER CELL (CD4) - (RCID CLINIC ONLY)
CD4 % Helper T Cell: 36 % (ref 33–65)
CD4 T Cell Abs: 566 /uL (ref 400–1790)

## 2019-05-15 LAB — COMPLETE METABOLIC PANEL WITH GFR
AG Ratio: 1 (calc) (ref 1.0–2.5)
ALT: 8 U/L (ref 6–29)
AST: 12 U/L (ref 10–30)
Albumin: 3.7 g/dL (ref 3.6–5.1)
Alkaline phosphatase (APISO): 52 U/L (ref 31–125)
BUN: 9 mg/dL (ref 7–25)
CO2: 28 mmol/L (ref 20–32)
Calcium: 9.5 mg/dL (ref 8.6–10.2)
Chloride: 105 mmol/L (ref 98–110)
Creat: 0.91 mg/dL (ref 0.50–1.10)
GFR, Est African American: 90 mL/min/{1.73_m2} (ref >=60)
GFR, Est Non African American: 77 mL/min/{1.73_m2} (ref >=60)
Globulin: 3.7 g/dL (calc) (ref 1.9–3.7)
Glucose, Bld: 92 mg/dL (ref 65–99)
Potassium: 4.1 mmol/L (ref 3.5–5.3)
Sodium: 139 mmol/L (ref 135–146)
Total Bilirubin: 0.3 mg/dL (ref 0.2–1.2)
Total Protein: 7.4 g/dL (ref 6.1–8.1)

## 2019-05-15 LAB — RPR: RPR Ser Ql: NONREACTIVE

## 2019-05-15 LAB — HIV-1 RNA QUANT-NO REFLEX-BLD
HIV 1 RNA Quant: <20 copies/mL
HIV-1 RNA Quant, Log: <1.3 Log copies/mL

## 2019-05-28 MED FILL — SYMTUZA 800-150-200-10 MG T: 800-150-200 | 30 days supply | Qty: 30 | Fill #2

## 2019-07-03 ENCOUNTER — Ambulatory Visit: Payer: Medicaid Other

## 2019-07-22 ENCOUNTER — Other Ambulatory Visit: Payer: Self-pay

## 2019-07-22 MED FILL — SYMTUZA 800-150-200-10 MG T: 800-150-200 | 30 days supply | Qty: 30 | Fill #3

## 2019-08-18 MED FILL — SYMTUZA 800-150-200-10 MG T: 800-150-200 | 30 days supply | Qty: 30 | Fill #4

## 2019-09-02 ENCOUNTER — Ambulatory Visit: Payer: Medicaid Other | Admitting: Infectious Diseases

## 2019-09-11 MED FILL — SYMTUZA 800-150-200-10 MG T: 800-150-200 | 30 days supply | Qty: 30 | Fill #5

## 2019-09-12 ENCOUNTER — Telehealth: Payer: Self-pay

## 2019-09-12 NOTE — Telephone Encounter (Signed)
COVID-19 Pre-Screening Questions:09/12/19  Do you currently have a fever (>100 F), chills or unexplained body aches?NO  Are you currently experiencing new cough, shortness of breath, sore throat, runny nose? NO .  Have you recently travelled outside the state of [place] in the last 14 days?NO .  Have you been in contact with someone that is currently pending confirmation of Covid19 testing or has been confirmed to have the [place] virus?  NO   **If the patient answers NO to ALL questions -  advise the patient to please call the clinic before coming to the office should any symptoms develop.

## 2019-09-15 ENCOUNTER — Ambulatory Visit: Payer: Medicaid Other | Admitting: Family

## 2019-09-22 ENCOUNTER — Other Ambulatory Visit: Payer: Self-pay

## 2019-09-22 ENCOUNTER — Ambulatory Visit
Admission: RE | Admit: 2019-09-22 | Discharge: 2019-09-22 | Disposition: A | Discharge status: Home / Self Care | Payer: Medicaid Other | Source: Ambulatory Visit | Attending: Family | Admitting: Family

## 2019-09-22 DIAGNOSIS — Z1231 Encounter for screening mammogram for malignant neoplasm of breast: Secondary | ICD-10-CM | POA: Diagnosis not present

## 2019-09-23 ENCOUNTER — Ambulatory Visit (INDEPENDENT_AMBULATORY_CARE_PROVIDER_SITE_OTHER): Payer: Medicaid Other | Admitting: Infectious Diseases

## 2019-09-23 ENCOUNTER — Other Ambulatory Visit (HOSPITAL_COMMUNITY)
Admission: RE | Admit: 2019-09-23 | Discharge: 2019-09-23 | Disposition: A | Discharge status: Home / Self Care | Payer: Medicaid Other | Source: Ambulatory Visit | Attending: Infectious Diseases | Admitting: Infectious Diseases

## 2019-09-23 ENCOUNTER — Encounter: Payer: Self-pay | Admitting: Infectious Diseases

## 2019-09-23 VITALS — BP 125/81 | HR 95 | Wt 273.5 lb

## 2019-09-23 DIAGNOSIS — B20 Human immunodeficiency virus [HIV] disease: Secondary | ICD-10-CM

## 2019-09-23 DIAGNOSIS — N921 Excessive and frequent menstruation with irregular cycle: Secondary | ICD-10-CM | POA: Diagnosis not present

## 2019-09-23 DIAGNOSIS — Z124 Encounter for screening for malignant neoplasm of cervix: Secondary | ICD-10-CM | POA: Insufficient documentation

## 2019-09-23 NOTE — Progress Notes (Signed)
      Subjective:    Tiffany Ray is a 44 y.o. female here for an annual pelvic exam and pap smear.   Review of Systems: Current GYN complaints or concerns: she has very heavy menses.  She has very painful periods and has for years. They are also very heavy - She occasionally gets more than one period a month. She has to use 3 pads stacked sometimes and needs to change every time she uses the bathroom. She had surgery for what she thinks was a cyst on an ovary, she is not quite sure. Has noticed some increased acne over face and abscesses to inframammary folds and underneath stomach at panty line.    Past Medical History:  Diagnosis Date  . HIV infection [facility])     Gynecologic History: No obstetric history on file.  Patient's last menstrual period was 09/02/2019 (approximate). Contraception: abstinence Last Pap: 08-2017. Results were: normal  Last Mammogram: not done   Objective:  Physical Exam  Constitutional: Well developed, well nourished, no acute distress. She is alert and oriented x3.  Pelvic: External genitalia is normal in appearance. The vagina is normal in appearance. The cervix is bulbous and easily visualized. No CMT, normal expected cervical mucus present. Bimanual exam reveals uterus that is felt to be normal size, shape, and contour. No adnexal masses or tenderness noted. Breasts: symmetrical in contour, shape and texture. No palpable masses/nodules. No nipple discharge.  Psych: She has a normal mood and affect.    Assessment & Plan;    Problem List Items Addressed This Visit      Unprioritized   Screening for cervical cancer    Normal pelvic exam. Normal bimanual exam.  Cervical brushing collected for cytology and HPV.  Discussed recommended screening interval for women living with HIV disease meant to be lifelong and at an interval of Q1-3 years pending results. Acceptable to space out Q3y with 3 consecutively normal exams. Further recommendations for Anwar Masten to follow today's results.  Results will be communicated to the patient via telephone call.        Relevant Orders   Cytology - PAP( [place]) (Completed)   Menorrhagia with irregular cycle - Primary    She has features concerning for possible insulin resistance / metabolic syndrome related to undiagnosed PCOS. Will check HgbA1c, TSH, and vaginal ultrasound today. Referrals to be coordinated pending results.       Relevant Orders   US PELVIS TRANSVAGINAL NON OB([facility])   Hemoglobin A1c (Completed)   Thyroid Panel With TSH (Completed)   HIV disease ([place])    RU with [name] as planned continuing on [place], MSN, NP-C [facility] [place] Office: [phone] Pager: [phone]  11/04/19 2:01 PM

## 2019-09-23 NOTE — Patient Instructions (Addendum)
Nice to meet you today!   Will help set up a vaginal ultrasound to see if we can figure out the heavy bleeding.   One thing that may help is a soap called Chlorhexidine - this is in a teal bottle at the pharmacy. No prescription needed. Start using this to breasts and groin once a week to see if it helps reduce the small pimples for you.   Tight fitting clothing (bras included) may be causing more friction to the skin and sweating makes these more likely to occur as well.   Good facial cleanser is Benzoyl Peroxide 10% ([employer] makes a generic brand that works well). Start with 3 times a week then increase to daily.

## 2019-09-24 LAB — THYROID PANEL WITH TSH
Free Thyroxine Index: 2.1 (ref 1.4–3.8)
T3 Uptake: 26 % (ref 22–35)
T4, Total: 8 ug/dL (ref 5.1–11.9)
TSH: 1.39 mIU/L

## 2019-09-24 LAB — HEMOGLOBIN A1C
Hgb A1c MFr Bld: 6 % of total Hgb — ABNORMAL HIGH (ref <5.7)
Mean Plasma Glucose: 126 (calc)
eAG (mmol/L): 7 (calc)

## 2019-09-25 ENCOUNTER — Telehealth: Payer: Self-pay | Admitting: Infectious Diseases

## 2019-09-25 DIAGNOSIS — N921 Excessive and frequent menstruation with irregular cycle: Secondary | ICD-10-CM

## 2019-09-25 LAB — CYTOLOGY - PAP
Chlamydia: NEGATIVE
Comment: NEGATIVE
Comment: NEGATIVE
Comment: NORMAL
Diagnosis: NEGATIVE
High risk HPV: NEGATIVE
Neisseria Gonorrhea: NEGATIVE

## 2019-09-25 NOTE — Telephone Encounter (Signed)
-----   Message from [place] sent at 09/25/2019  2:47 PM EST ----- Regarding: results Patient would like to speak to you about her test results. Patient declined speaking with triage

## 2019-09-25 NOTE — Telephone Encounter (Signed)
I called Tiffany Ray and discussed her recent lab results.   Thyroid panel normal.   Pap smear is normal with cytology and negative HPV. Can proceed with Q3 year screenings.  STI screening negative.   Her A1C however came back pre-diabetic. We discussed this in depth over the phone including recommendations for nutrition changes - specifically lower carbohydrate approach to weight loss. I will send her more information to her MyChart for resources to educate herself on nutrition changes that will benefit her.   Could consider starting Metformin now but will allow [name] to follow up next visit.

## 2019-10-03 ENCOUNTER — Telehealth: Payer: Self-pay

## 2019-10-03 NOTE — Telephone Encounter (Signed)
COVID-19 Pre-Screening Questions:10/03/19  Do you currently have a fever (>100 F), chills or unexplained body aches? NO  Are you currently experiencing new cough, shortness of breath, sore throat, runny nose? NO .  Have you recently travelled outside the state of [place] in the last 14 days? NO .  Have you been in contact with someone that is currently pending confirmation of Covid19 testing or has been confirmed to have the [place] virus? NO  **If the patient answers NO to ALL questions -  advise the patient to please call the clinic before coming to the office should any symptoms develop.

## 2019-10-06 ENCOUNTER — Other Ambulatory Visit: Payer: Self-pay

## 2019-10-06 ENCOUNTER — Ambulatory Visit (INDEPENDENT_AMBULATORY_CARE_PROVIDER_SITE_OTHER): Payer: Medicaid Other | Admitting: Family

## 2019-10-06 ENCOUNTER — Encounter: Payer: Self-pay | Admitting: Family

## 2019-10-06 VITALS — BP 120/83 | HR 90 | Temp 98.1°F | Ht 70.0 in | Wt 279.0 lb

## 2019-10-06 DIAGNOSIS — B20 Human immunodeficiency virus [HIV] disease: Secondary | ICD-10-CM

## 2019-10-06 DIAGNOSIS — E119 Type 2 diabetes mellitus without complications: Secondary | ICD-10-CM | POA: Insufficient documentation

## 2019-10-06 DIAGNOSIS — Z0000 Encounter for general adult medical examination without abnormal findings: Secondary | ICD-10-CM

## 2019-10-06 DIAGNOSIS — R7303 Prediabetes: Secondary | ICD-10-CM

## 2019-10-06 HISTORY — DX: Type 2 diabetes mellitus without complications: E11.9

## 2019-10-06 MED ORDER — SYMTUZA 800-150-200-10 MG PO TABS: 1.0000 | ORAL_TABLET | Freq: Every day | ORAL | 5 refills | Status: DC

## 2019-10-06 NOTE — Assessment & Plan Note (Signed)
Tiffany Ray has newly diagnosed prediabetes likely associated with nutritional intake.  Previously counseled on recommendations for reduction of carbohydrate intake and avoiding of sugary/processed foods.  Discussed various food choices and options and I believe this may be controlled with nutrition and no medical intervention at present although left poorly controlled may result in development of diabetes.  Continue to monitor.

## 2019-10-06 NOTE — Progress Notes (Signed)
Subjective:    Patient ID: Tiffany Ray, female    DOB: 07/02/1976, 44 y.o.   MRN: [id number]  Chief Complaint  Patient presents with  . Follow-up     HPI:  Tiffany Ray is a 45 y.o. female with HIV disease who was last seen in the office on 09/23/2019 for Pap smear and cervical cancer screening and last seen for HIV on 05/13/2019 with good adherence and tolerance to her ART regimen of Symtuza.  Blood work on 05/13/2019 with a viral load that was undetectable and CD4 count of 566.  No recent blood work obtained.  Cervical cancer screening negative along with mammogram.  She was noted to have prediabetes with A1c of 6.0.  Tiffany Ray continues to take her Symtuza as prescribed with no adverse side effects or missed doses.  Overall feeling well today with concerns for her new diagnosis of prediabetes. Denies fevers, chills, night sweats, headaches, changes in vision, neck pain/stiffness, nausea, diarrhea, vomiting, lesions or rashes.  Tiffany Ray has no problems obtaining her medication from the pharmacy and remains covered through [facility].  Denies feelings of being down, depressed, or hopeless recently.  She continues to smoke approximately 1/2 pack of cigarettes per day on average with no alcohol consumption or recreational or illicit drug use.  Not currently sexually active and declines condoms.   No Known Allergies    Outpatient Medications Prior to Visit  Medication Sig Dispense Refill  . Darunavir-Cobicisctat-Emtricitabine-Tenofovir Alafenamide (SYMTUZA) 800-150-200-10 MG TABS Take 1 tablet by mouth daily with breakfast. 30 tablet 5   No facility-administered medications prior to visit.     Past Medical History:  Diagnosis Date  . HIV infection ([place])      History reviewed. No pertinent surgical history.   Review of Systems  Constitutional: Negative for appetite change, chills, diaphoresis, fatigue, fever and unexpected weight change.  Eyes:       Negative for acute  change in vision  Respiratory: Negative for chest tightness, shortness of breath and wheezing.   Cardiovascular: Negative for chest pain.  Gastrointestinal: Negative for diarrhea, nausea and vomiting.  Genitourinary: Negative for dysuria, pelvic pain and vaginal discharge.  Musculoskeletal: Negative for neck pain and neck stiffness.  Skin: Negative for rash.  Neurological: Negative for seizures, syncope, weakness and headaches.  Hematological: Negative for adenopathy. Does not bruise/bleed easily.  Psychiatric/Behavioral: Negative for hallucinations.      Objective:    BP 120/83   Pulse 90   Temp 98.1 F (36.7 C)   Ht 5\' 10"  (1.778 m)   Wt 279 lb (126.6 kg)   SpO2 96%   BMI 40.03 kg/m  Nursing note and vital signs reviewed.  Physical Exam Constitutional:      General: She is not in acute distress.    Appearance: She is well-developed.  Eyes:     Conjunctiva/sclera: Conjunctivae normal.  Cardiovascular:     Rate and Rhythm: Normal rate and regular rhythm.     Heart sounds: Normal heart sounds. No murmur. No friction rub. No gallop.   Pulmonary:     Effort: Pulmonary effort is normal. No respiratory distress.     Breath sounds: Normal breath sounds. No wheezing or rales.  Chest:     Chest wall: No tenderness.  Abdominal:     General: Bowel sounds are normal.     Palpations: Abdomen is soft.     Tenderness: There is no abdominal tenderness.  Musculoskeletal:     Cervical back: Neck supple.  Lymphadenopathy:     Cervical: No cervical adenopathy.  Skin:    General: Skin is warm and dry.     Findings: No rash.  Neurological:     Mental Status: She is alert and oriented to person, place, and time.  Psychiatric:        Behavior: Behavior normal.        Thought Content: Thought content normal.        Judgment: Judgment normal.      Depression screen [facility] 2/9 05/13/2019 11/07/2018 09/13/2018  Decreased Interest 0 0 0  Down, Depressed, Hopeless 0 0 0  PHQ - 2 Score 0  0 0       Assessment & Plan:    Patient Active Problem List   Diagnosis Date Noted  . Prediabetes 10/06/2019  . HIV disease ([place]) 09/13/2018  . Healthcare maintenance 09/13/2018     Problem List Items Addressed This Visit      Other   HIV disease ([place]) - Primary    Tiffany Ray continues to have well-controlled HIV disease with good adherence and tolerance to her ART regimen of Symtuza.  No signs/symptoms of opportunistic infection or progressive HIV disease.  We reviewed previous lab work and discussed plan of care.  She has no problems obtaining her medication from the pharmacy.  Continue current dose of Symtuza.  Check blood work today.  Plan for follow-up in 4 months or sooner if needed.      Relevant Medications   Darunavir-Cobicisctat-Emtricitabine-Tenofovir Alafenamide (SYMTUZA) 800-150-200-10 MG TABS   Other Relevant Orders   T-helper cell (CD4)- ([facility] clinic only)   HIV-1 RNA quant-no reflex-bld   COMPLETE METABOLIC PANEL WITH GFR   T-helper cell (CD4)- ([facility] clinic only)   HIV-1 RNA quant-no reflex-bld   Comprehensive metabolic panel   Healthcare maintenance     Discussed importance of safe sexual practice to reduce risk of STI.  Condoms declined.  Cervical cancer and breast cancer screenings up-to-date per recommendations.      Prediabetes    Tiffany Ray has newly diagnosed prediabetes likely associated with nutritional intake.  Previously counseled on recommendations for reduction of carbohydrate intake and avoiding of sugary/processed foods.  Discussed various food choices and options and I believe this may be controlled with nutrition and no medical intervention at present although left poorly controlled may result in development of diabetes.  Continue to monitor.      Relevant Orders   HgB A1c       I am having Tiffany Ray maintain her Symtuza.   Meds ordered this encounter  Medications  . Darunavir-Cobicisctat-Emtricitabine-Tenofovir Alafenamide  (SYMTUZA) 800-150-200-10 MG TABS    Sig: Take 1 tablet by mouth daily with breakfast.    Dispense:  30 tablet    Refill:  5    Order Specific Question:   Supervising Provider    Answer:   Carlyle Basques [[id number]]     Follow-up: Return in about 3 months (around 01/06/2020), or if symptoms worsen or fail to improve.   Terri Piedra, MSN, FNP-C Nurse Practitioner [facility] for Infectious Disease [place] number: [phone]

## 2019-10-06 NOTE — Assessment & Plan Note (Signed)
Ms. Crotwell continues to have well-controlled HIV disease with good adherence and tolerance to her ART regimen of Symtuza.  No signs/symptoms of opportunistic infection or progressive HIV disease.  We reviewed previous lab work and discussed plan of care.  She has no problems obtaining her medication from the pharmacy.  Continue current dose of Symtuza.  Check blood work today.  Plan for follow-up in 4 months or sooner if needed.

## 2019-10-06 NOTE — Patient Instructions (Signed)
Nice to see you.  We will check your blood work today.  Continue take your Symtuza as prescribed daily.  Refills will be sent to the pharmacy.  Work on cutting back carbohydrate intake.  Plan for follow-up in 3 months or sooner if needed with lab work on the same day or 1 to 2 weeks prior to your appointment.  Have a great day and stay safe!

## 2019-10-06 NOTE — Assessment & Plan Note (Signed)
   Discussed importance of safe sexual practice to reduce risk of STI.  Condoms declined.  Cervical cancer and breast cancer screenings up-to-date per recommendations.

## 2019-10-07 LAB — T-HELPER CELL (CD4) - (RCID CLINIC ONLY)
CD4 % Helper T Cell: 35 % (ref 33–65)
CD4 T Cell Abs: 504 /uL (ref 400–1790)

## 2019-10-08 LAB — COMPLETE METABOLIC PANEL WITH GFR
AG Ratio: 0.9 (calc) — ABNORMAL LOW (ref 1.0–2.5)
ALT: 12 U/L (ref 6–29)
AST: 15 U/L (ref 10–30)
Albumin: 3.7 g/dL (ref 3.6–5.1)
Alkaline phosphatase (APISO): 59 U/L (ref 31–125)
BUN: 11 mg/dL (ref 7–25)
CO2: 27 mmol/L (ref 20–32)
Calcium: 9.4 mg/dL (ref 8.6–10.2)
Chloride: 103 mmol/L (ref 98–110)
Creat: 0.82 mg/dL (ref 0.50–1.10)
GFR, Est African American: 101 mL/min/{1.73_m2} (ref >=60)
GFR, Est Non African American: 87 mL/min/{1.73_m2} (ref >=60)
Globulin: 3.9 g/dL (calc) — ABNORMAL HIGH (ref 1.9–3.7)
Glucose, Bld: 123 mg/dL — ABNORMAL HIGH (ref 65–99)
Potassium: 4.4 mmol/L (ref 3.5–5.3)
Sodium: 137 mmol/L (ref 135–146)
Total Bilirubin: 0.3 mg/dL (ref 0.2–1.2)
Total Protein: 7.6 g/dL (ref 6.1–8.1)

## 2019-10-08 LAB — HIV-1 RNA QUANT-NO REFLEX-BLD
HIV 1 RNA Quant: <20 copies/mL
HIV-1 RNA Quant, Log: <1.3 Log copies/mL

## 2019-10-08 MED FILL — SYMTUZA 800-150-200-10 MG T: 800-150-200 | 30 days supply | Qty: 30 | Fill #0

## 2019-10-14 ENCOUNTER — Ambulatory Visit (HOSPITAL_COMMUNITY): Admission: RE | Admit: 2019-10-14 | Payer: Medicaid Other | Source: Ambulatory Visit

## 2019-11-04 DIAGNOSIS — Z124 Encounter for screening for malignant neoplasm of cervix: Secondary | ICD-10-CM | POA: Insufficient documentation

## 2019-11-04 DIAGNOSIS — N921 Excessive and frequent menstruation with irregular cycle: Secondary | ICD-10-CM | POA: Insufficient documentation

## 2019-11-04 NOTE — Assessment & Plan Note (Signed)
RU with [name] as planned continuing on [place]

## 2019-11-04 NOTE — Assessment & Plan Note (Signed)
Normal pelvic exam. Normal bimanual exam.  Cervical brushing collected for cytology and HPV.  Discussed recommended screening interval for women living with HIV disease meant to be lifelong and at an interval of Q1-3 years pending results. Acceptable to space out Q3y with 3 consecutively normal exams. Further recommendations for Tiffany Ray to follow today's results.  Results will be communicated to the patient via telephone call.

## 2019-11-04 NOTE — Assessment & Plan Note (Signed)
She has features concerning for possible insulin resistance / metabolic syndrome related to undiagnosed PCOS. Will check HgbA1c, TSH, and vaginal ultrasound today. Referrals to be coordinated pending results.

## 2019-11-06 MED FILL — SYMTUZA 800-150-200-10 MG T: 800-150-200 | 30 days supply | Qty: 30 | Fill #1

## 2019-11-18 NOTE — Telephone Encounter (Signed)
RN spoke with patient.  She would like to mark her chart as Private as she has family who work in the [employer]. Patient has been marked as Confidential.  RN called her back to let her know. Landis Gandy, RN

## 2019-12-04 MED FILL — SYMTUZA 800-150-200-10 MG T: 800-150-200 | 30 days supply | Qty: 30 | Fill #2

## 2019-12-08 ENCOUNTER — Encounter (HOSPITAL_COMMUNITY): Payer: Self-pay

## 2019-12-08 ENCOUNTER — Telehealth: Payer: Self-pay | Admitting: Infectious Diseases

## 2019-12-08 ENCOUNTER — Ambulatory Visit (HOSPITAL_COMMUNITY)
Admission: RE | Admit: 2019-12-08 | Discharge: 2019-12-08 | Disposition: A | Discharge status: Home / Self Care | Payer: Medicaid Other | Source: Ambulatory Visit | Attending: Infectious Diseases | Admitting: Infectious Diseases

## 2019-12-08 ENCOUNTER — Ambulatory Visit (HOSPITAL_COMMUNITY): Admission: RE | Admit: 2019-12-08 | Payer: Medicaid Other | Source: Ambulatory Visit

## 2019-12-08 DIAGNOSIS — N921 Excessive and frequent menstruation with irregular cycle: Secondary | ICD-10-CM

## 2019-12-08 DIAGNOSIS — R1900 Intra-abdominal and pelvic swelling, mass and lump, unspecified site: Secondary | ICD-10-CM

## 2019-12-08 NOTE — Telephone Encounter (Signed)
Called patient with results of transvaginal ultrasound with large complex appearing cystic mass in pelvis, likely ovarian in origin. She has had a h/o ovarian cysts in the past. We discussed referral to gyn surgery team for further work up / evaluation.  Referral placed for the same.    Janene Madeira, MSN, NP-C [facility] for Infectious Disease [email or web address] Pager: [phone] Office: [phone] [place]: [phone]

## 2020-01-06 ENCOUNTER — Ambulatory Visit: Payer: Medicaid Other | Admitting: Family

## 2020-01-07 MED FILL — SYMTUZA 800-150-200-10 MG T: 800-150-200 | 30 days supply | Qty: 30 | Fill #3

## 2020-02-09 NOTE — Telephone Encounter (Signed)
Called patient to follow up on Mychart message. Per patient she still has not heard back from anyone regarding appointment for [place] and referral to OBGYN surgery team. Will forward message to referral coordinator to follow up on. Will call patient once CMA has more information.  [place]

## 2020-02-23 ENCOUNTER — Encounter: Payer: Self-pay | Admitting: Obstetrics & Gynecology

## 2020-02-23 ENCOUNTER — Ambulatory Visit (INDEPENDENT_AMBULATORY_CARE_PROVIDER_SITE_OTHER): Payer: Medicaid Other | Admitting: Obstetrics & Gynecology

## 2020-02-23 ENCOUNTER — Other Ambulatory Visit: Payer: Self-pay

## 2020-02-23 VITALS — BP 93/75 | HR 116 | Ht 70.0 in | Wt 264.8 lb

## 2020-02-23 DIAGNOSIS — R102 Pelvic and perineal pain: Secondary | ICD-10-CM | POA: Diagnosis not present

## 2020-02-23 DIAGNOSIS — R1900 Intra-abdominal and pelvic swelling, mass and lump, unspecified site: Secondary | ICD-10-CM

## 2020-02-23 MED FILL — SYMTUZA 800-150-200-10 MG T: 800-150-200 | 30 days supply | Qty: 30 | Fill #4

## 2020-02-23 NOTE — Patient Instructions (Signed)
Pelvic Mass, Female  A pelvic mass is an abnormal growth in the pelvis. The pelvis is the area between your hip bones. It includes the bladder, rectum, uterus, and ovaries. A pelvic mass may be found during a routine pelvic exam or while performing an MRI, CT scan, or ultrasound for other problems of the abdomen. What are common types of pelvic masses? Pelvic masses include:  Ovarian cysts. These are fluid-filled sacs that form on an ovary.  Tumors. These may be cancerous (malignant) or noncancerous (benign). Noncancerous tumors in the uterus are called uterine fibroids.  Ectopic pregnancy. This is when the fertilized egg attaches (implants) outside the uterus.  Infections. What type of testing may be needed? Your health care provider may recommend that you have tests to diagnose the cause of the pelvic mass. The following tests may be done if a pelvic mass is found:  Physical exam.  Blood tests.  X-rays.  Ultrasound.  CT scan.  MRI.  A surgery to look inside your abdomen with cameras (laparoscopy).  A biopsy that is performed with a needle or during laparoscopy or surgery. In some cases, what seemed like a pelvic mass may actually be something else, such as a mass in one of the organs that is near the pelvis, an infection (abscess), or scar tissue (adhesions) that formed after a surgery. Tests and physical exams may be done once, or they may be done regularly for a period of time. Tests and exams that are done regularly will help monitor whether the mass or tissue change is growing and becoming a concern. What are common treatments? Treatment is not always needed for this condition. Your health care provider may recommend careful monitoring (watchful waiting) and regular tests and exams. Treatment will depend on the cause of the mass. Follow these instructions at home:  What you need to do at home will depend on the cause of the mass. Follow the instructions that your health  care provider gives to you. In general: ? Keep all follow-up visits as directed by your health care provider. This is important. ? Take over-the-counter and prescription medicines only as directed by your health care provider. ? If you were prescribed an antibiotic medicine, take it as told by your health care provider. Do not stop taking the antibiotic even if you start to feel better. ? Follow any restrictions that are given to you by your health care provider.  Try to stay calm, and be sure to ask questions. Make sure you understand the recommendations for monitoring and whether there is a reason for concern. Contact a health care provider if you:  Develop new symptoms.  Note changes in the size, shape, or position of your mass.  Are unable to have a bowel movement.  Bruise or bleed easily. Get help right away if you:  Vomit bright red blood or vomit material that looks like coffee grounds.  Have blood in your stools, or the stools turn black and tarry.  Have an abnormal or increased amount of vaginal bleeding.  Have a fever or chills.  Develop sudden or worsening pain that is not relieved by medicine.  Feel dizzy or weak.  Feel light-headed or you faint.  Feel that the mass has suddenly gotten larger.  Develop severe bloating in your abdomen or your pelvis.  Cannot pass any urine. Summary  A pelvic mass is an abnormal growth in the pelvis. The pelvis is the area between your hip bones. It includes the bladder, rectum,  uterus, and ovaries.  Pelvic masses include ovarian cysts, tumors, ectopic pregnancy, or infections.  Your health care provider may recommend that you have tests to diagnose the cause of the pelvic mass.  Treatment will depend on the cause of the mass. This information is not intended to replace advice given to you by your health care provider. Make sure you discuss any questions you have with your health care provider. Document Revised: 07/25/2017  Document Reviewed: 07/25/2017 Elsevier Patient Education  2020 Elsevier Inc.  

## 2020-02-23 NOTE — Progress Notes (Signed)
Patient ID: Tiffany Ray, female   DOB: 1975/08/09, 44 y.o.   MRN: [id number]  No chief complaint on file. cc: irregular periods and pelvic pain  HPI Tiffany Ray is a 44 y.o. female.  [id number] Patient's last menstrual period was 02/11/2020 (exact date). Patient has pelvic pain with and without menses that can be severe. US showed mass  Past Medical History:  Diagnosis Date  . HIV infection ([place])     No past surgical history on file.  No family history on file.  Social History Social History   Tobacco Use  . Smoking status: Current Every Day Smoker    Packs/day: 0.50    Types: Cigarettes  . Smokeless tobacco: Never Used  Vaping Use  . Vaping Use: Never used  Substance Use Topics  . Alcohol use: Never  . Drug use: Never    No Known Allergies  Current Outpatient Medications  Medication Sig Dispense Refill  . Darunavir-Cobicisctat-Emtricitabine-Tenofovir Alafenamide (SYMTUZA) 800-150-200-10 MG TABS Take 1 tablet by mouth daily with breakfast. 30 tablet 5   No current facility-administered medications for this visit.    Review of Systems Review of Systems  Constitutional: Negative.   Gastrointestinal: Positive for abdominal pain. Negative for abdominal distention.  Genitourinary: Positive for menstrual problem and pelvic pain. Negative for vaginal bleeding.    Blood pressure 93/75, pulse (!) 116, height 5\' 10"  (1.778 m), weight 264 lb 12.8 oz (120.1 kg), last menstrual period 02/11/2020.  Physical Exam Physical Exam Vitals and nursing note reviewed. Exam conducted with a chaperone present.  Constitutional:      Appearance: Normal appearance.  Pulmonary:     Effort: Pulmonary effort is normal.  Abdominal:     Palpations: Abdomen is soft. There is no mass.  Genitourinary:    General: Normal vulva.     Vagina: No vaginal discharge.     Comments: Uterus normal, Tender left adnexal mass noted Skin:    General: Skin is warm.  Neurological:     Mental Status:  She is alert.  Psychiatric:        Mood and Affect: Mood normal.        Behavior: Behavior normal.     Data Reviewed Narrative & Impression  CLINICAL DATA:  Menorrhagia with irregular cycle  EXAM: TRANSABDOMINAL AND TRANSVAGINAL ULTRASOUND OF PELVIS  TECHNIQUE: Both transabdominal and transvaginal ultrasound examinations of the pelvis were performed. Transabdominal technique was performed for global imaging of the pelvis including uterus, ovaries, adnexal regions, and pelvic cul-de-sac. It was necessary to proceed with endovaginal exam following the transabdominal exam to visualize the uterus and endometrium.  COMPARISON:  None  FINDINGS: Uterus  Measurements: 9.1 x 3.7 x 5.8 cm = volume: 101.5 mL. No fibroids or other mass visualized.  Endometrium  Thickness: 7.1 mm.  No focal abnormality visualized.  Right ovary  Not discretely visualized  Left ovary  Not discretely visualized  Other findings  Large complex cystic mass with septations and internal low level echoes posterior to the uterus measuring 13.2 x 6.7 x 8.5 cm. No significant free fluid.  IMPRESSION: 1. Normal premenopausal endometrial thickness of 7.1 mm. 2. Large complex cystic mass within the pelvis measuring up to 13.2 cm, probably ovarian in origin though the ovaries are not discretely visualized. Surgical consultation is recommended.  These results will be called to the ordering clinician or representative by the Radiologist Assistant, and communication documented in the PACS or [employer].   Electronically Signed   By: Madie Reno.D.  On: 12/08/2019 15:15      Assessment Pelvic mass in female - Plan: CA 125, MR PELVIS W CONTRAST  Pelvic pain in female - Plan: MR PELVIS W CONTRAST    Plan Orders Placed This Encounter  Procedures  . MR PELVIS W CONTRAST    Standing Status:   Future    Standing Expiration Date:   05/25/2020    Order Specific  Question:   If indicated for the ordered procedure, I authorize the administration of contrast media per Radiology protocol    Answer:   Yes    Order Specific Question:   What is the patient's sedation requirement?    Answer:   No Sedation    Order Specific Question:   Does the patient have a pacemaker or implanted devices?    Answer:   No    Order Specific Question:   Radiology Contrast Protocol - do NOT remove file path    Answer:   [email or web address]    Order Specific Question:   Preferred imaging location?    Answer:   [facility] (table limit - 500 lbs)  . CA 125   RTC for review of results and consider plan for surgery    Emeterio Reeve 02/23/2020, 3:09 PM

## 2020-02-23 NOTE — Progress Notes (Signed)
Pt. Presents for cyst and irregular cycles. Pt. Takes she had her period twice the last two months.  Pt. Expresses severe pain during cycles leaving pt. In a "balled up fetal" position.

## 2020-02-24 LAB — CA 125: Cancer Antigen (CA) 125: 118 U/mL — ABNORMAL HIGH (ref 0.0–38.1)

## 2020-02-25 ENCOUNTER — Ambulatory Visit (INDEPENDENT_AMBULATORY_CARE_PROVIDER_SITE_OTHER): Payer: Medicaid Other | Admitting: Family

## 2020-02-25 ENCOUNTER — Other Ambulatory Visit: Payer: Self-pay

## 2020-02-25 ENCOUNTER — Other Ambulatory Visit: Payer: Self-pay | Admitting: Family

## 2020-02-25 ENCOUNTER — Encounter: Payer: Self-pay | Admitting: Family

## 2020-02-25 VITALS — BP 129/87 | HR 101 | Temp 98.8°F | Wt 266.0 lb

## 2020-02-25 DIAGNOSIS — B20 Human immunodeficiency virus [HIV] disease: Secondary | ICD-10-CM

## 2020-02-25 DIAGNOSIS — R7303 Prediabetes: Secondary | ICD-10-CM

## 2020-02-25 DIAGNOSIS — R1900 Intra-abdominal and pelvic swelling, mass and lump, unspecified site: Secondary | ICD-10-CM | POA: Diagnosis not present

## 2020-02-25 DIAGNOSIS — Z79899 Other long term (current) drug therapy: Secondary | ICD-10-CM | POA: Diagnosis not present

## 2020-02-25 MED ORDER — SYMTUZA 800-150-200-10 MG PO TABS: 1.0000 | ORAL_TABLET | Freq: Every day | ORAL | 5 refills | Status: DC

## 2020-02-25 NOTE — Patient Instructions (Addendum)
Nice to see you.  We will check your lab work today.  Continue to take your Symtuza daily as prescribed.  Refills have been sent to the pharmacy.   Plan for follow up in 4 months or sooner if needed with lab work on the same day.  Have a great day and stay safe!

## 2020-02-25 NOTE — Assessment & Plan Note (Signed)
Previously noted to have prediabetes with hemoglobin A1c of 6.0.  Has lost a couple pounds since her last office visit.  Recheck hemoglobin A1c.  Continue dietary management.

## 2020-02-25 NOTE — Assessment & Plan Note (Signed)
Tiffany Ray is a 44 year old female with HIV-1 disease with risk factors for acquiring HIV including heterosexual contact who has good adherence and tolerance to her ART regimen of Symtuza.  Reviewed previous lab work and discussed plan of care.  No current signs/symptoms of opportunistic infection or progressive HIV disease.  Check blood work today.  Continue current dose of Symtuza.  Plan for follow-up in 4 months or sooner if needed with lab work on the same day.

## 2020-02-25 NOTE — Assessment & Plan Note (Signed)
Newly diagnosed pelvic mass being followed by gynecology.  Blood work with elevated CA 125.  Await gynecology interpretation and imaging.

## 2020-02-25 NOTE — Progress Notes (Signed)
Subjective:    Patient ID: Tiffany Ray, female    DOB: 03-11-1976, 44 y.o.   MRN: [id number]  Chief Complaint  Patient presents with   Follow-up     HPI:  Tiffany Ray is a 44 y.o. female with HIV disease with risk factors including heterosexual contact who was last seen in the office on 10/06/2019 with good adherence and tolerance to her ART regimen of Symtuza.  Viral load at the time was undetectable with CD4 count of 504.  Since her last office visit she was seen by gynecology with concern for pelvic mass.  Currently undergoing further evaluation.  Tiffany Ray continues to take her Symtuza daily as prescribed with no adverse side effects or missed doses since her last office visit.  Overall feeling anxious today regarding lab work completed with gynecology 2 days ago. Denies fevers, chills, night sweats, headaches, changes in vision, neck pain/stiffness, nausea, diarrhea, vomiting, lesions or rashes.  Tiffany Ray has no problem obtaining her medication from the pharmacy and remains covered through [facility].  She is anxious but denies feelings of being down, depressed, or hopeless.  No recreational or illicit drug use or alcohol consumption.  She does smoke approximately 1/2 pack of cigarettes per day on average.  Not currently sexually active and declines condoms.  She is interested in receiving the Covid vaccine.   No Known Allergies    Outpatient Medications Prior to Visit  Medication Sig Dispense Refill   Darunavir-Cobicisctat-Emtricitabine-Tenofovir Alafenamide (SYMTUZA) 800-150-200-10 MG TABS Take 1 tablet by mouth daily with breakfast. 30 tablet 5   No facility-administered medications prior to visit.     Past Medical History:  Diagnosis Date   HIV infection ([place])      History reviewed. No pertinent surgical history.     Review of Systems  Constitutional: Negative for appetite change, chills, diaphoresis, fatigue, fever and unexpected weight change.  Eyes:         Negative for acute change in vision  Respiratory: Negative for chest tightness, shortness of breath and wheezing.   Cardiovascular: Negative for chest pain.  Gastrointestinal: Negative for diarrhea, nausea and vomiting.  Genitourinary: Negative for dysuria, pelvic pain and vaginal discharge.  Musculoskeletal: Negative for neck pain and neck stiffness.  Skin: Negative for rash.  Neurological: Negative for seizures, syncope, weakness and headaches.  Hematological: Negative for adenopathy. Does not bruise/bleed easily.  Psychiatric/Behavioral: Negative for hallucinations.      Objective:    BP 129/87    Pulse (!) 101    Temp 98.8 F (37.1 C) (Oral)    Wt 266 lb (120.7 kg)    LMP 02/11/2020 (Exact Date)    BMI 38.17 kg/m  Nursing note and vital signs reviewed.  Physical Exam Constitutional:      General: She is not in acute distress.    Appearance: She is well-developed.  Eyes:     Conjunctiva/sclera: Conjunctivae normal.  Cardiovascular:     Rate and Rhythm: Normal rate and regular rhythm.     Heart sounds: Normal heart sounds. No murmur heard.  No friction rub. No gallop.   Pulmonary:     Effort: Pulmonary effort is normal. No respiratory distress.     Breath sounds: Normal breath sounds. No wheezing or rales.  Chest:     Chest wall: No tenderness.  Abdominal:     General: Bowel sounds are normal.     Palpations: Abdomen is soft.     Tenderness: There is no abdominal tenderness.  Musculoskeletal:  Cervical back: Neck supple.  Lymphadenopathy:     Cervical: No cervical adenopathy.  Skin:    General: Skin is warm and dry.     Findings: No rash.  Neurological:     Mental Status: She is alert and oriented to person, place, and time.  Psychiatric:        Behavior: Behavior normal.        Thought Content: Thought content normal.        Judgment: Judgment normal.      Depression screen [facility] 2/9 05/13/2019 11/07/2018 09/13/2018  Decreased Interest 0 0 0  Down,  Depressed, Hopeless 0 0 0  PHQ - 2 Score 0 0 0       Assessment & Plan:    Patient Active Problem List   Diagnosis Date Noted   Pelvic mass in female 02/23/2020   Pelvic pain in female 02/23/2020   Menorrhagia with irregular cycle 11/04/2019   Screening for cervical cancer 11/04/2019   Prediabetes 10/06/2019   HIV disease ([place]) 09/13/2018   Healthcare maintenance 09/13/2018     Problem List Items Addressed This Visit      Other   HIV disease ([place])    Tiffany Ray is a 44 year old female with HIV-1 disease with risk factors for acquiring HIV including heterosexual contact who has good adherence and tolerance to her ART regimen of Symtuza.  Reviewed previous lab work and discussed plan of care.  No current signs/symptoms of opportunistic infection or progressive HIV disease.  Check blood work today.  Continue current dose of Symtuza.  Plan for follow-up in 4 months or sooner if needed with lab work on the same day.      Relevant Medications   Darunavir-Cobicisctat-Emtricitabine-Tenofovir Alafenamide (SYMTUZA) 800-150-200-10 MG TABS   Other Relevant Orders   COMPLETE METABOLIC PANEL WITH GFR   HIV-1 RNA quant-no reflex-bld   T-helper cell (CD4)- ([facility] clinic only)   Prediabetes    Previously noted to have prediabetes with hemoglobin A1c of 6.0.  Has lost a couple pounds since her last office visit.  Recheck hemoglobin A1c.  Continue dietary management.      Relevant Orders   HgB A1c   Pelvic mass in female    Newly diagnosed pelvic mass being followed by gynecology.  Blood work with elevated CA 125.  Await gynecology interpretation and imaging.       Other Visit Diagnoses    Pharmacologic therapy    -  Primary   Relevant Orders   Lipid Profile       I am having Tiffany Ray maintain her Symtuza.   Meds ordered this encounter  Medications   Darunavir-Cobicisctat-Emtricitabine-Tenofovir Alafenamide (SYMTUZA) 800-150-200-10 MG TABS    Sig: Take 1 tablet by  mouth daily with breakfast.    Dispense:  30 tablet    Refill:  5    Order Specific Question:   Supervising Provider    Answer:   Carlyle Basques [[id number]]     Follow-up: Return in about 4 months (around 06/26/2020), or if symptoms worsen or fail to improve.   Terri Piedra, MSN, FNP-C Nurse Practitioner [name] [place] number: [phone]

## 2020-02-26 ENCOUNTER — Telehealth: Payer: Self-pay | Admitting: Infectious Diseases

## 2020-02-26 DIAGNOSIS — R1900 Intra-abdominal and pelvic swelling, mass and lump, unspecified site: Secondary | ICD-10-CM

## 2020-02-26 LAB — T-HELPER CELL (CD4) - (RCID CLINIC ONLY)
CD4 % Helper T Cell: 38 % (ref 33–65)
CD4 T Cell Abs: 520 /uL (ref 400–1790)

## 2020-02-26 NOTE — Telephone Encounter (Signed)
Left non-specific voicemail to patient requesting call back to discuss results of recent tests.

## 2020-02-28 LAB — HEMOGLOBIN A1C
Hgb A1c MFr Bld: 6 % of total Hgb — ABNORMAL HIGH (ref <5.7)
Mean Plasma Glucose: 126 (calc)
eAG (mmol/L): 7 (calc)

## 2020-02-28 LAB — COMPLETE METABOLIC PANEL WITH GFR
AG Ratio: 1.1 (calc) (ref 1.0–2.5)
ALT: 10 U/L (ref 6–29)
AST: 13 U/L (ref 10–30)
Albumin: 4 g/dL (ref 3.6–5.1)
Alkaline phosphatase (APISO): 61 U/L (ref 31–125)
BUN: 10 mg/dL (ref 7–25)
CO2: 27 mmol/L (ref 20–32)
Calcium: 9.8 mg/dL (ref 8.6–10.2)
Chloride: 103 mmol/L (ref 98–110)
Creat: 0.9 mg/dL (ref 0.50–1.10)
GFR, Est African American: 90 mL/min/{1.73_m2} (ref >=60)
GFR, Est Non African American: 78 mL/min/{1.73_m2} (ref >=60)
Globulin: 3.7 g/dL (calc) (ref 1.9–3.7)
Glucose, Bld: 90 mg/dL (ref 65–99)
Potassium: 4.3 mmol/L (ref 3.5–5.3)
Sodium: 136 mmol/L (ref 135–146)
Total Bilirubin: 0.3 mg/dL (ref 0.2–1.2)
Total Protein: 7.7 g/dL (ref 6.1–8.1)

## 2020-02-28 LAB — LIPID PANEL
Cholesterol: 270 mg/dL — ABNORMAL HIGH (ref <200)
HDL: 55 mg/dL (ref >=50)
LDL Cholesterol (Calc): 187 mg/dL (calc) — ABNORMAL HIGH
Non-HDL Cholesterol (Calc): 215 mg/dL (calc) — ABNORMAL HIGH (ref <130)
Total CHOL/HDL Ratio: 4.9 (calc) (ref <5.0)
Triglycerides: 142 mg/dL (ref <150)

## 2020-02-28 LAB — HIV-1 RNA QUANT-NO REFLEX-BLD
HIV 1 RNA Quant: 23 Copies/mL — ABNORMAL HIGH
HIV-1 RNA Quant, Log: 1.36 Log cps/mL — ABNORMAL HIGH

## 2020-03-23 MED FILL — SYMTUZA 800-150-200-10 MG T: 800-150-200 | 30 days supply | Qty: 30 | Fill #5

## 2020-04-16 ENCOUNTER — Ambulatory Visit (HOSPITAL_COMMUNITY): Admission: RE | Admit: 2020-04-16 | Payer: Medicaid Other | Source: Ambulatory Visit

## 2020-04-19 ENCOUNTER — Ambulatory Visit (HOSPITAL_COMMUNITY)
Admission: RE | Admit: 2020-04-19 | Discharge: 2020-04-19 | Disposition: A | Discharge status: Home / Self Care | Payer: Medicaid Other | Source: Ambulatory Visit | Attending: Obstetrics & Gynecology | Admitting: Obstetrics & Gynecology

## 2020-04-19 DIAGNOSIS — R1900 Intra-abdominal and pelvic swelling, mass and lump, unspecified site: Secondary | ICD-10-CM | POA: Diagnosis not present

## 2020-04-19 DIAGNOSIS — R102 Pelvic and perineal pain: Secondary | ICD-10-CM | POA: Diagnosis not present

## 2020-04-19 DIAGNOSIS — N838 Other noninflammatory disorders of ovary, fallopian tube and broad ligament: Secondary | ICD-10-CM | POA: Diagnosis not present

## 2020-04-19 DIAGNOSIS — N83292 Other ovarian cyst, left side: Secondary | ICD-10-CM | POA: Diagnosis not present

## 2020-04-19 DIAGNOSIS — N281 Cyst of kidney, acquired: Secondary | ICD-10-CM | POA: Diagnosis not present

## 2020-04-19 DIAGNOSIS — N858 Other specified noninflammatory disorders of uterus: Secondary | ICD-10-CM | POA: Diagnosis not present

## 2020-04-19 MED ORDER — GADOBUTROL 1 MMOL/ML IV SOLN
10.0000 mL | Freq: Once | INTRAVENOUS | Status: AC | PRN
  Administered 2020-04-19: 10 mL via INTRAVENOUS

## 2020-04-19 MED FILL — SYMTUZA 800-150-200-10 MG T: 800-150-200 | 30 days supply | Qty: 30 | Fill #0

## 2020-04-28 ENCOUNTER — Ambulatory Visit (INDEPENDENT_AMBULATORY_CARE_PROVIDER_SITE_OTHER): Payer: Medicaid Other | Admitting: Obstetrics & Gynecology

## 2020-04-28 ENCOUNTER — Other Ambulatory Visit: Payer: Self-pay

## 2020-04-28 ENCOUNTER — Encounter: Payer: Self-pay | Admitting: Obstetrics & Gynecology

## 2020-04-28 VITALS — BP 135/85 | HR 91 | Wt 269.0 lb

## 2020-04-28 DIAGNOSIS — R1900 Intra-abdominal and pelvic swelling, mass and lump, unspecified site: Secondary | ICD-10-CM

## 2020-04-28 DIAGNOSIS — B20 Human immunodeficiency virus [HIV] disease: Secondary | ICD-10-CM

## 2020-04-28 DIAGNOSIS — N921 Excessive and frequent menstruation with irregular cycle: Secondary | ICD-10-CM

## 2020-04-28 NOTE — Progress Notes (Signed)
RGYN patient presents for F/U after MRI of Pelvis on 04/19/2020.

## 2020-04-28 NOTE — Progress Notes (Signed)
Subjective:s/p f/u MRI for pelvic mass     Patient ID: Tiffany Ray, female   DOB: 13-Sep-1975, 44 y.o.   MRN: [id number]  [id number] No LMP recorded. Patient has had complex pelvic mass seen on [place] and f/u MRI was done. This showed a stable mass with some septations no nodules, could not r/o neoplasm. Her CA 125 was elevated moderately. No c/o pain  Past Medical History:  Diagnosis Date  . HIV infection ([place])    OB History    Gravida  5   Para  4   Term  4   Preterm  0   AB  1   Living  4     SAB  0   TAB  0   Ectopic  1   Multiple  0   Live Births  4         L/S for ectopic many years ago tubal ligation No Known Allergies Current Outpatient Medications on File Prior to Visit  Medication Sig Dispense Refill  . Darunavir-Cobicisctat-Emtricitabine-Tenofovir Alafenamide (SYMTUZA) 800-150-200-10 MG TABS Take 1 tablet by mouth daily with breakfast. 30 tablet 5   No current facility-administered medications on file prior to visit.     Review of Systems  Constitutional: Negative.   Respiratory: Negative.   Endocrine:       VMS  Genitourinary: Positive for menstrual problem (heavy prolonged menses). Negative for pelvic pain.       Objective:   Physical Exam Vitals and nursing note reviewed.  Constitutional:      Appearance: She is obese.  Pulmonary:     Effort: Pulmonary effort is normal.  Neurological:     Mental Status: She is alert.   CLINICAL DATA:  Complex cystic mass in the pelvis on ultrasound.  EXAM: MRI PELVIS WITHOUT AND WITH CONTRAST  TECHNIQUE: Multiplanar multisequence MR imaging of the pelvis was performed both before and after administration of intravenous contrast.  CONTRAST:  41mL GADAVIST GADOBUTROL 1 MMOL/ML IV SOLN  COMPARISON:  Ultrasound exam 12/08/2019  FINDINGS: Urinary Tract:  Bladder is nondistended.  Bowel:  Unremarkable visualized pelvic bowel loops.  Vascular/Lymphatic: Normal flow signal and  enhancement of dominant vascular anatomy along the pelvic sidewalls.  Upper normal to borderline enlarged lymph nodes are seen in the external iliac and common femoral chains as well as groin regions bilaterally.  Reproductive: Uterus measures 8.6 x 3.7 x 5.3 cm and is displaced inferiorly to the right of midline by a cystic left paramidline pelvic mass cranial to the uterus. Cystic mass lesion measures 11.8 x 6.1 x 7.5 cm and has relatively smooth, thin, well-defined margin with several thin partial septations. No irregular mural thickening or mural nodularity.  There is an ovary identified to the left of the uterus above the bladder in just inferior to the cystic mass lesion (see coronal T2 image 15 of series 3. This ovary measures 2.1 x 4.0 x 2.9 cm with multiple follicles evident. Position of this ovary would suggest that it is left-sided and the cystic mass cannot be determined to arise directly from this left ovary. The right ovary is not clearly identified.  Other: No ascites. Multiple small simple cysts are seen in the left pelvic sidewall along the external iliac vasculature. These appear to be discrete, show no enhancement, and do not appear to be ovarian origin.  Musculoskeletal: No focal suspicious marrow enhancement within the visualized bony anatomy.  IMPRESSION: 1. 11.8 x 6.1 x 7.5 cm cystic mass lesion in the left  paramidline pelvis cranial to the uterus displaces the uterus inferiorly and to the right of midline. This mass was measured at 13.2 x 6.7 x 8.5 cm on a ultrasound exam from 5 months ago suggesting that it is not rapidly progressive. The walls of the cystic mass are thin and well-defined. Imaging features compatible with cystic neoplasm and there are some incomplete thin internal septations, but no mural nodularity or irregular mural thickening to raise overt concern for malignancy although this possibility is not excluded. Origin of the large  cystic mass is not clearly demonstrated on today's MRI. There is an ovary immediately inferior to the mass, presumably the left ovary, but the mass cannot be seen to arise directly from this presumed left ovary. The right ovary is not identified on the current exam. 2. No ascites. 3. Upper normal to borderline enlarged lymph nodes in the external iliac and common femoral chains as well as groin regions bilaterally. This finding may be reactive and related to the patient's infectious disease status.   Electronically Signed   By: Misty Stanley M.D.   On: 04/20/2020 06:31      Assessment:     Persistent complex pelvic mass, h/o BTL and ectopic, with elevated CA 125    Plan:     We discussed surgical management. Due to increased possibility of neoplasm (although still less likely) she agrees to consult with Gyn onc for recommendation and management. Referral requested  Woodroe Mode, MD 04/28/2020

## 2020-04-28 NOTE — Patient Instructions (Signed)
Diagnostic Laparoscopy Diagnostic laparoscopy is a procedure to diagnose diseases in the abdomen. It might be done for a variety of reasons, such as to look for scar tissue, cancer, or a reason for abdomen (abdominal) pain. During the procedure, a thin, flexible tube that has a light and a camera on the end (laparoscope) is inserted through an incision in the abdomen. The image from the camera is shown on a monitor to help your surgeon see inside your body. Tell a health care provider about:  Any allergies you have.  All medicines you are taking, including vitamins, herbs, eye drops, creams, and over-the-counter medicines.  Any problems you or family members have had with anesthetic medicines.  Any blood disorders you have.  Any surgeries you have had.  Any medical conditions you have. What are the risks? Generally, this is a safe procedure. However, problems may occur, including:  Infection.  Bleeding.  Allergic reactions to medicines or dyes.  Damage to abdominal structures or organs, such as the intestines, liver, stomach, or spleen. What happens before the procedure? Medicines  Ask your health care provider about: ? Changing or stopping your regular medicines. This is especially important if you are taking diabetes medicines or blood thinners. ? Taking medicines such as aspirin and ibuprofen. These medicines can thin your blood. Do not take these medicines unless your health care provider tells you to take them. ? Taking over-the-counter medicines, vitamins, herbs, and supplements.  You may be given antibiotic medicine to help prevent infection. Staying hydrated Follow instructions from your health care provider about hydration, which may include:  Up to 2 hours before the procedure - you may continue to drink clear liquids, such as water, clear fruit juice, black coffee, and plain tea. Eating and drinking restrictions Follow instructions from your health care provider  about eating and drinking, which may include:  8 hours before the procedure - stop eating heavy meals or foods such as meat, fried foods, or fatty foods.  6 hours before the procedure - stop eating light meals or foods, such as toast or cereal.  6 hours before the procedure - stop drinking milk or drinks that contain milk.  2 hours before the procedure - stop drinking clear liquids. General instructions  Ask your health care provider how your surgical site will be marked or identified.  You may be asked to shower with a germ-killing soap.  Plan to have someone take you home from the hospital or clinic.  Plan to have a responsible adult care for you for at least 24 hours after you leave the hospital or clinic. This is important. What happens during the procedure?   To lower your risk of infection: ? Your health care team will wash or sanitize their hands. ? Hair may be removed from the surgical area. ? Your skin will be washed with soap.  An IV will be inserted into one of your veins.  You will be given a medicine to make you fall asleep (general anesthetic). You may also be given a medicine to help you relax (sedative).  A breathing tube will be placed down your throat to help you breathe during the procedure.  Your abdomen will be filled with an air-like gas so it expands. This will give the surgeon more room to operate and will make your organs easier to see.  Many small incisions will be made in your abdomen.  A laparoscope and other surgical instruments will be inserted into your abdomen through the   incisions.  A tissue sample may be removed from an organ for examination (biopsy). This will depend on the reason why you are having this procedure.  The laparoscope and other instruments will be removed from your abdomen.  The gas will be released.  Your incisions will be closed with stitches (sutures) and covered with a bandage (dressing).  Your breathing tube will be  removed. The procedure may vary among health care providers and hospitals. What happens after the procedure?   Your blood pressure, heart rate, breathing rate, and blood oxygen level will be monitored until the medicines you were given have worn off.  Do not drive for 24 hours if you were given a sedative during your procedure.  It is up to you to get the results of your procedure. Ask your health care provider, or the department that is doing the procedure, when your results will be ready. Summary  Diagnostic laparoscopy is a way to look for problems in the abdomen using small incisions.  Follow instructions from your health care provider about how to prepare for the procedure.  Plan to have a responsible adult care for you for at least 24 hours after you leave the hospital or clinic. This is important. This information is not intended to replace advice given to you by your health care provider. Make sure you discuss any questions you have with your health care provider. Document Revised: 06/15/2017 Document Reviewed: 12/27/2016 Elsevier Patient Education  2020 [employer]  

## 2020-04-30 ENCOUNTER — Telehealth: Payer: Self-pay | Admitting: *Deleted

## 2020-04-30 NOTE — Telephone Encounter (Signed)
Called the patient this morning to scheduled a new patient appt per referral from Dr Jordan Hawks office. Patient requested a call back and 2 pm. Called the patient at 3:25 pm to schedule the patient for an new patient appt, patient stated "I work in the morning and day until 1:30 pm. I can be there after that." Explained that I would have to speak with the doctor and call her back.

## 2020-05-06 ENCOUNTER — Telehealth: Payer: Self-pay | Admitting: *Deleted

## 2020-05-06 NOTE — Telephone Encounter (Addendum)
Called the patient back and scheduled her to see Dr Berline Lopes on 11/8 at 2:30 pm. Gave the patient the address and phone number for the clinic. Also gave the policy for mask and visitors. Appt scheduled out due to the patient request for afternoon appt

## 2020-05-13 MED FILL — SYMTUZA 800-150-200-10 MG T: 800-150-200 | 30 days supply | Qty: 30 | Fill #1

## 2020-05-24 ENCOUNTER — Encounter: Payer: Self-pay | Admitting: Gynecologic Oncology

## 2020-05-24 ENCOUNTER — Inpatient Hospital Stay: Payer: Medicaid Other | Attending: Gynecologic Oncology | Admitting: Gynecologic Oncology

## 2020-05-24 ENCOUNTER — Other Ambulatory Visit: Payer: Self-pay

## 2020-05-24 VITALS — BP 126/84 | HR 89 | Temp 96.4°F | Resp 20 | Ht 69.37 in | Wt 267.6 lb

## 2020-05-24 DIAGNOSIS — N939 Abnormal uterine and vaginal bleeding, unspecified: Secondary | ICD-10-CM | POA: Diagnosis not present

## 2020-05-24 DIAGNOSIS — R1900 Intra-abdominal and pelvic swelling, mass and lump, unspecified site: Secondary | ICD-10-CM | POA: Insufficient documentation

## 2020-05-24 DIAGNOSIS — Z21 Asymptomatic human immunodeficiency virus [HIV] infection status: Secondary | ICD-10-CM | POA: Diagnosis not present

## 2020-05-24 NOTE — Patient Instructions (Signed)
It was a pleasure meeting you today.  We will plan for surgery to remove the ovarian cyst on December 23.  Please call the clinic if this date ends up not working out from a work standpoint.  We will see you back in mid December for a preoperative visit.  I will call you with the biopsy results today when they are back.

## 2020-05-24 NOTE — Progress Notes (Signed)
GYNECOLOGIC ONCOLOGY NEW PATIENT CONSULTATION   Patient Name: Tiffany Ray  Patient Age: 44 y.o. Date of Service: 05/24/2020 Referring Provider: Dr. Emeterio Reeve  Primary Care Provider: Patient, No Pcp Per Consulting Provider: Jeral Pinch, MD   Assessment/Plan:  Premenopausal patient with a complex although mostly simple adnexal mass.  Discussed pelvic ultrasound and MRI findings with the patient.  Overall it is very reassuring that the mass has not grown in size over 5 months between her initial and repeat imaging.  Additionally, the mass is overall simple in nature and has thin septations that are avascular.  There are no significant features on ultrasound that raise concern for malignancy.  Overall, my concern that this is a malignant process is quite low.  In terms of her mildly elevated CA-125, I reviewed that this is neither a sensitive nor specific test.  There are many noncancerous disease processes that can cause the CA-125 to be elevated.  Given the size of her cyst, I think it is unlikely that it will resolve on its own.  In the setting of her abdominal pain, which is likely at least partially related to this mass, I think it is reasonable to proceed with definitive surgery for both therapeutic and diagnostic purposes.    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  We have tentatively set a date for surgery of 12/23.  Plan will be for robotic unilateral salpingo-oophorectomy and resection of the pelvic mass, possible mini laparotomy.  We will plan to send the specimen for frozen section.  If benign, then no further surgery would be performed.  If a borderline tumor, then I would recommend removal of the contralateral tube and ovary as well as uterus and cervix.  Only in the setting of malignancy, which again I think is very unlikely, would she need additional staging to include omentectomy, lymph node biopsy, and any  other indicated procedures.  Given her history of abnormal bleeding in the setting of obesity and increased risk of endogenous estrogen exposure, I recommended an endometrial biopsy to rule out uterine pathology, notably hyperplasia or malignancy.  This was performed today.  I will call the patient with these results.  I recommended this be done prior to surgery so that any pathology could be treated at the same time as surgery for her pelvic mass.  Given her most recent HIV viral counts, I will reach out to her infectious disease physician to ask if there are any considerations perioperatively.   A copy of this note was sent to the patient's referring provider.   Jeral Pinch, MD  Division of Gynecologic Oncology  Department of Obstetrics and Gynecology  [name]  ___________________________________________  Chief Complaint: Chief Complaint  Patient presents with  . Pelvic mass in female    History of Present Illness:  Tiffany Ray is a 44 y.o. y.o. female who is seen in consultation at the request of Dr. Roselie Awkward for an evaluation of a complex adnexal mass.  Patient was initially referred after a pelvic ultrasound performed for abdominal/pelvic pain revealed a complex mass.  Overall the mass was simple in appearance with several avascular septations.  The patient describes over a 2-year history of abdominal and pelvic pain that she notes feels like contractions, that often last all day.  She has this pain during her menses but will intermittently have it when she is not menstruating as well.  In  the last 2 years or so since just before moving to [place] from [place], she has noted some changes with her menses.  She describes that her bleeding has become heavier, she bleeds now for as long as 7-8 days, and will have 1 or 2 periods a month.  Her cramping during menses can be so bad that it puts her in fetal position.  She was taking Advil previously  but this did not help very much with her abdominal and back pain.  She now uses Goody's which provides significant relief.  Otherwise, the patient reports she is doing well.  She endorses a good appetite without nausea or emesis.  She notes normal bowel function and denies any pain with bowel movements.  She denies urinary symptoms including frequency or pressure.  She is not currently sexually active as her partner is in [place].  In the past, she denies any dyspareunia.  Patient denies any pulmonary or cardiac problems.  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  She takes her ART therapy daily although missed several days the last time she went up to [place] and had to have her daughter drive up her medication.  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  She often has to lift up to 40 pounds.    PAST MEDICAL HISTORY:  Past Medical History:  Diagnosis Date  . Abnormal uterine bleeding (AUB)   . Adnexal mass   . HIV infection ([place])   . Prediabetes      PAST SURGICAL HISTORY:  Past Surgical History:  Procedure Laterality Date  . ECTOPIC PREGNANCY SURGERY    . TUBAL LIGATION      OB/GYN HISTORY:  OB History  Gravida Para Term Preterm AB Living  5 4 4  0 1 4  SAB TAB Ectopic Multiple Live Births  0 0 1 0 4    # Outcome Date GA Lbr Len/2nd Weight Sex Delivery Anes PTL Lv  5 Ectopic           4 Term           3 Term           2 Term           1 Term             No LMP recorded.  Age at menarche: 67  Age at menopause: n/a Hx of HRT: n/a Hx of STDs: HIV+ Last pap: 2021 - normal, HPV negative History of abnormal pap smears: no  SCREENING STUDIES:  Last mammogram: 09/2019   MEDICATIONS: Outpatient Encounter Medications as of 05/24/2020  Medication Sig  . Aspirin-Acetaminophen-Caffeine (GOODYS EXTRA STRENGTH PO) Take by mouth as directed.  . Darunavir-Cobicisctat-Emtricitabine-Tenofovir Alafenamide (SYMTUZA) 800-150-200-10 MG TABS Take 1 tablet  by mouth daily with breakfast.   No facility-administered encounter medications on file as of 05/24/2020.    ALLERGIES:  No Known Allergies   FAMILY HISTORY:  Family History  Problem Relation Age of Onset  . Lung cancer Mother   . Hypertension Brother   . Lung cancer Father   . Colon cancer Other   . Hypertension Maternal Grandmother   . Breast cancer Neg Hx   . Uterine cancer Neg Hx   . Ovarian cancer Neg Hx      SOCIAL HISTORY:    Social Connections:   . Frequency of Communication with Friends and Family: Not on file  . Frequency of Social Gatherings with Friends and  Family: Not on file  . Attends Religious Services: Not on file  . Active Member of Clubs or Organizations: Not on file  . Attends Archivist Meetings: Not on file  . Marital Status: Not on file    REVIEW OF SYSTEMS:  + abdominal and back pain Denies appetite changes, fevers, chills, fatigue, unexplained weight changes. Denies hearing loss, neck lumps or masses, mouth sores, ringing in ears or voice changes. Denies cough or wheezing.  Denies shortness of breath. Denies chest pain or palpitations. Denies leg swelling. Denies abdominal distention, blood in stools, constipation, diarrhea, nausea, vomiting, or early satiety. Denies pain with intercourse, dysuria, frequency, hematuria or incontinence. Denies hot flashes, pelvic pain, vaginal bleeding or vaginal discharge.   Denies joint pain or muscle pain/cramps. Denies itching, rash, or wounds. Denies dizziness, headaches, numbness or seizures. Denies swollen lymph nodes or glands, denies easy bruising or bleeding. Denies anxiety, depression, confusion, or decreased concentration.  Physical Exam:  Vital Signs for this encounter:  Blood pressure 126/84, pulse 89, temperature (!) 96.4 F (35.8 C), temperature source Tympanic, resp. rate 20, height 5' 9.37" (1.762 m), weight 267 lb 9.6 oz (121.4 kg), SpO2 100 %. Body mass index is 39.1  kg/m. General: Alert, oriented, no acute distress.  HEENT: Normocephalic, atraumatic. Sclera anicteric.  Chest: Clear to auscultation bilaterally. No wheezes, rhonchi, or rales. Cardiovascular: Regular rate and rhythm, no murmurs, rubs, or gallops.  Abdomen: Obese. Normoactive bowel sounds. Soft, nondistended, nontender to palpation. No masses or hepatosplenomegaly appreciated. No palpable fluid wave.  Extremities: Grossly normal range of motion. Warm, well perfused. No edema bilaterally.  Skin: No rashes or lesions.  Lymphatics: No cervical, supraclavicular, or inguinal adenopathy.  GU:  Normal external female genitalia. No lesions. No discharge or bleeding.             Bladder/urethra:  No lesions or masses, well supported bladder             Vagina: Well rugated, no lesions or masses.             Cervix: Normal appearing, multiparous, no lesions.             Uterus: Uterus and mass removed together, together appreciated almost up to the level of the umbilicus although this is somewhat limited by body habitus.             Adnexa: See above, mass feels in the midline.  No nodularity appreciated.  Endometrial biopsy Preoperative diagnosis: Abnormal uterine bleeding Postoperative diagnosis: Same as above Procedure: Endometrial biopsy Physician: Jeral Pinch EBL: Less than 5 cc Specimen: Endometrial biopsy, placed in formalin and sent to pathology Complications: None apparent Procedure in detail: The procedure was explained and verbal consent was obtained from the patient.  After bimanual was performed, the speculum was placed and the cervix was prepped with Betadine x3.  A single-tooth tenaculum was placed on the anterior lip of the cervix.  An endometrial Pipelle was inserted through the os and into the uterus.  The uterus sounded to just over 7 cm.  1 pass was performed with adequate tissue obtained.  All instruments were removed from the vagina after assuring hemostasis.  The patient  tolerated procedure.   LABORATORY AND RADIOLOGIC DATA:  Outside medical records were reviewed to synthesize the above history, along with the history and physical obtained during the visit.   Lab Results  Component Value Date   WBC 3.9 12/23/2018   HGB 12.3 12/23/2018   HCT  36.9 12/23/2018   PLT 288 12/23/2018   GLUCOSE 90 02/25/2020   CHOL 270 (H) 02/25/2020   TRIG 142 02/25/2020   HDL 55 02/25/2020   LDLCALC 187 (H) 02/25/2020   ALT 10 02/25/2020   AST 13 02/25/2020   NA 136 02/25/2020   K 4.3 02/25/2020   CL 103 02/25/2020   CREATININE 0.90 02/25/2020   BUN 10 02/25/2020   CO2 27 02/25/2020   TSH 1.39 09/23/2019   HGBA1C 6.0 (H) 02/25/2020   CA-125: 118  MRI pelvis 04/19/20: IMPRESSION: 1. 11.8 x 6.1 x 7.5 cm cystic mass lesion in the left paramidline pelvis cranial to the uterus displaces the uterus inferiorly and to the right of midline. This mass was measured at 13.2 x 6.7 x 8.5 cm on a ultrasound exam from 5 months ago suggesting that it is not rapidly progressive. The walls of the cystic mass are thin and well-defined. Imaging features compatible with cystic neoplasm and there are some incomplete thin internal septations, but no mural nodularity or irregular mural thickening to raise overt concern for malignancy although this possibility is not excluded. Origin of the large cystic mass is not clearly demonstrated on today's MRI. There is an ovary immediately inferior to the mass, presumably the left ovary, but the mass cannot be seen to arise directly from this presumed left ovary. The right ovary is not identified on the current exam. 2. No ascites. 3. Upper normal to borderline enlarged lymph nodes in the external iliac and common femoral chains as well as groin regions bilaterally. This finding may be reactive and related to the patient's infectious disease status.  Pelvic ultrasound 11/2019: IMPRESSION: 1. Normal premenopausal endometrial thickness of  7.1 mm. 2. Large complex cystic mass within the pelvis measuring up to 13.2 cm, probably ovarian in origin though the ovaries are not discretely visualized. Surgical consultation is recommended.

## 2020-05-26 LAB — SURGICAL PATHOLOGY

## 2020-06-07 MED FILL — SYMTUZA 800-150-200-10 MG T: 800-150-200 | 30 days supply | Qty: 30 | Fill #2

## 2020-06-15 ENCOUNTER — Other Ambulatory Visit: Payer: Self-pay | Admitting: Gynecologic Oncology

## 2020-06-15 DIAGNOSIS — R1900 Intra-abdominal and pelvic swelling, mass and lump, unspecified site: Secondary | ICD-10-CM

## 2020-06-22 ENCOUNTER — Encounter: Payer: Medicaid Other | Admitting: Gynecologic Oncology

## 2020-06-30 NOTE — Progress Notes (Deleted)
Patient here *** for a pre-operative appointment prior to her scheduled surgery on ***. She is scheduled for a ***.  She had her pre-admission testing appointment this am at [facility].  The surgery was discussed in detail.  See after visit summary for additional details. Visual aids used to discuss items related to surgery including the incentive spirometer, sequential compression stockings, foley catheter, IV pump, multi-modal pain regimen including tylenol, photo of the surgical robot, female reproductive system to discuss surgery in detail.      Discussed post-op pain management in detail including the aspects of the enhanced recovery pathway.  Advised her that a new prescription would be sent in for *** and it is only to be used for after her upcoming surgery.  We discussed the use of tylenol post-op and to monitor for a maximum of 4,000 mg in a 24 hour period.  Also prescribed sennakot to be used after surgery and to hold if having loose stools.  Discussed bowel regimen in detail.     Discussed the use of lovenox pre-op, SCDs, and measures to take at home to prevent DVT including frequent mobility.  Reportable signs and symptoms of DVT discussed. Post-operative instructions discussed and expectations for after surgery. Incisional care discussed as well including reportable signs and symptoms including erythema, drainage, wound separation.     *** minutes spent with the patient.  Verbalizing understanding of material discussed. No needs or concerns voiced at the end of the visit.   Advised patient and family to call for any needs.  Advised that her post-operative medications had been prescribed and could be picked up at any time.    

## 2020-07-01 ENCOUNTER — Encounter: Payer: Self-pay | Admitting: Gynecologic Oncology

## 2020-07-02 ENCOUNTER — Telehealth: Payer: Self-pay | Admitting: *Deleted

## 2020-07-02 ENCOUNTER — Encounter: Payer: Self-pay | Admitting: Gynecologic Oncology

## 2020-07-02 ENCOUNTER — Ambulatory Visit: Payer: Medicaid Other | Admitting: Gynecologic Oncology

## 2020-07-02 NOTE — Telephone Encounter (Signed)
Patient called and rescheduled her appt from today to Monday  

## 2020-07-05 ENCOUNTER — Encounter (HOSPITAL_COMMUNITY): Payer: Self-pay

## 2020-07-05 ENCOUNTER — Inpatient Hospital Stay: Payer: Medicaid Other | Attending: Gynecologic Oncology | Admitting: Gynecologic Oncology

## 2020-07-05 ENCOUNTER — Encounter: Payer: Self-pay | Admitting: Gynecologic Oncology

## 2020-07-05 ENCOUNTER — Telehealth: Payer: Self-pay

## 2020-07-05 ENCOUNTER — Other Ambulatory Visit: Payer: Self-pay

## 2020-07-05 ENCOUNTER — Encounter (HOSPITAL_COMMUNITY)
Admission: RE | Admit: 2020-07-05 | Discharge: 2020-07-05 | Disposition: A | Discharge status: Home / Self Care | Payer: Medicaid Other | Source: Ambulatory Visit | Attending: Gynecologic Oncology | Admitting: Gynecologic Oncology

## 2020-07-05 ENCOUNTER — Other Ambulatory Visit (HOSPITAL_COMMUNITY)
Admission: RE | Admit: 2020-07-05 | Discharge: 2020-07-05 | Disposition: A | Discharge status: Home / Self Care | Payer: Medicaid Other | Source: Ambulatory Visit | Attending: Gynecologic Oncology | Admitting: Gynecologic Oncology

## 2020-07-05 ENCOUNTER — Other Ambulatory Visit: Payer: Self-pay | Admitting: Gynecologic Oncology

## 2020-07-05 VITALS — BP 127/82 | HR 87 | Temp 97.1°F | Resp 16 | Ht 69.37 in | Wt 211.1 lb

## 2020-07-05 DIAGNOSIS — Z20822 Contact with and (suspected) exposure to covid-19: Secondary | ICD-10-CM | POA: Insufficient documentation

## 2020-07-05 DIAGNOSIS — Z01812 Encounter for preprocedural laboratory examination: Secondary | ICD-10-CM | POA: Insufficient documentation

## 2020-07-05 DIAGNOSIS — R1900 Intra-abdominal and pelvic swelling, mass and lump, unspecified site: Secondary | ICD-10-CM

## 2020-07-05 DIAGNOSIS — N939 Abnormal uterine and vaginal bleeding, unspecified: Secondary | ICD-10-CM

## 2020-07-05 HISTORY — DX: Pneumonia, unspecified organism: J18.9

## 2020-07-05 HISTORY — DX: Prediabetes: R73.03

## 2020-07-05 HISTORY — DX: Anemia, unspecified: D64.9

## 2020-07-05 LAB — URINALYSIS, ROUTINE W REFLEX MICROSCOPIC
Bilirubin Urine: NEGATIVE
Glucose, UA: NEGATIVE mg/dL
Ketones, ur: NEGATIVE mg/dL
Leukocytes,Ua: NEGATIVE
Nitrite: NEGATIVE
Protein, ur: NEGATIVE mg/dL
Specific Gravity, Urine: 1.021 (ref 1.005–1.030)
pH: 5 (ref 5.0–8.0)

## 2020-07-05 LAB — BASIC METABOLIC PANEL
Anion gap: 8 (ref 5–15)
BUN: 9 mg/dL (ref 6–20)
CO2: 25 mmol/L (ref 22–32)
Calcium: 9.1 mg/dL (ref 8.9–10.3)
Chloride: 104 mmol/L (ref 98–111)
Creatinine, Ser: 0.98 mg/dL (ref 0.44–1.00)
GFR, Estimated: >60 mL/min (ref >60)
Glucose, Bld: 103 mg/dL — ABNORMAL HIGH (ref 70–99)
Potassium: 4.3 mmol/L (ref 3.5–5.1)
Sodium: 137 mmol/L (ref 135–145)

## 2020-07-05 LAB — CBC
HCT: 36.7 % (ref 36.0–46.0)
Hemoglobin: 11.4 g/dL — ABNORMAL LOW (ref 12.0–15.0)
MCH: 25.8 pg — ABNORMAL LOW (ref 26.0–34.0)
MCHC: 31.1 g/dL (ref 30.0–36.0)
MCV: 83 fL (ref 80.0–100.0)
Platelets: 336 10*3/uL (ref 150–400)
RBC: 4.42 MIL/uL (ref 3.87–5.11)
RDW: 16.2 % — ABNORMAL HIGH (ref 11.5–15.5)
WBC: 4.7 10*3/uL (ref 4.0–10.5)
nRBC: 0 % (ref 0.0–0.2)

## 2020-07-05 LAB — SARS CORONAVIRUS 2 (TAT 6-24 HRS): SARS Coronavirus 2: NEGATIVE

## 2020-07-05 MED ORDER — OXYCODONE HCL 5 MG PO TABS: 5.0000 mg | ORAL_TABLET | ORAL | 0 refills | Status: DC | PRN

## 2020-07-05 MED ORDER — SENNOSIDES-DOCUSATE SODIUM 8.6-50 MG PO TABS: 2.0000 | ORAL_TABLET | Freq: Every day | ORAL | 0 refills | Status: DC

## 2020-07-05 MED ORDER — IBUPROFEN 600 MG PO TABS: 600.0000 mg | ORAL_TABLET | Freq: Four times a day (QID) | ORAL | 0 refills | Status: DC | PRN

## 2020-07-05 MED FILL — IBUPROFEN 600 MG TABLET: 600 | 7 days supply | Qty: 30 | Fill #0

## 2020-07-05 MED FILL — oxyCODONE HCL 5 MG TABS: 5 | 3 days supply | Qty: 15 | Fill #0

## 2020-07-05 NOTE — H&P (View-Only) (Signed)
Patient here for a pre-operative appointment prior to her scheduled surgery on July 08, 2020. She is scheduled for a robotic assisted laparoscopic unilateral salpingo-oophorectomy (removal of one ovary and fallopian tube), possible mini laparotomy (larger incision on your abdomen if needed), possible bilateral salpingo-oophorectomy (removal of both ovaries and fallopian tubes), possible total laparoscopic hysterectomy (removal of the uterus and cervix), possible staging if a cancer is identified. She has her pre-admission testing appointment this am at [facility].  The surgery was discussed in detail.  See after visit summary for additional details. Visual aids used to discuss items related to surgery including the incentive spirometer, sequential compression stockings, foley catheter, IV pump, multi-modal pain regimen including tylenol, photo of the surgical robot, female reproductive system to discuss surgery in detail.      Discussed post-op pain management in detail including the aspects of the enhanced recovery pathway.  Advised her that a new prescription would be sent in for oxycodone and it is only to be used for after her upcoming surgery.  We discussed the use of tylenol post-op and to monitor for a maximum of 4,000 mg in a 24 hour period.  Also prescribed sennakot to be used after surgery and to hold if having loose stools.  Discussed bowel regimen in detail.  Ibuprofen 600 mg prescribed but advised patient to use sparingly post-operatively given the interaction with her home med, Symtuza.     Discussed the use of SCDs, and measures to take at home to prevent DVT including frequent mobility.  Reportable signs and symptoms of DVT discussed. Post-operative instructions discussed and expectations for after surgery. Incisional care discussed as well including reportable signs and symptoms including erythema, drainage, wound separation.     10 minutes spent with the patient.  Verbalizing  understanding of material discussed. No needs or concerns voiced at the end of the visit.   Advised patient and family to call for any needs.  Advised that her post-operative medications had been prescribed and our office would contact her about whether her medications could be delivered to her house.

## 2020-07-05 NOTE — Progress Notes (Signed)
Patient here for a pre-operative appointment prior to her scheduled surgery on July 08, 2020. She is scheduled for a robotic assisted laparoscopic unilateral salpingo-oophorectomy (removal of one ovary and fallopian tube), possible mini laparotomy (larger incision on your abdomen if needed), possible bilateral salpingo-oophorectomy (removal of both ovaries and fallopian tubes), possible total laparoscopic hysterectomy (removal of the uterus and cervix), possible staging if a cancer is identified. She has her pre-admission testing appointment this am at [facility].  The surgery was discussed in detail.  See after visit summary for additional details. Visual aids used to discuss items related to surgery including the incentive spirometer, sequential compression stockings, foley catheter, IV pump, multi-modal pain regimen including tylenol, photo of the surgical robot, female reproductive system to discuss surgery in detail.      Discussed post-op pain management in detail including the aspects of the enhanced recovery pathway.  Advised her that a new prescription would be sent in for oxycodone and it is only to be used for after her upcoming surgery.  We discussed the use of tylenol post-op and to monitor for a maximum of 4,000 mg in a 24 hour period.  Also prescribed sennakot to be used after surgery and to hold if having loose stools.  Discussed bowel regimen in detail.  Ibuprofen 600 mg prescribed but advised patient to use sparingly post-operatively given the interaction with her home med, Symtuza.     Discussed the use of SCDs, and measures to take at home to prevent DVT including frequent mobility.  Reportable signs and symptoms of DVT discussed. Post-operative instructions discussed and expectations for after surgery. Incisional care discussed as well including reportable signs and symptoms including erythema, drainage, wound separation.     10 minutes spent with the patient.  Verbalizing  understanding of material discussed. No needs or concerns voiced at the end of the visit.   Advised patient and family to call for any needs.  Advised that her post-operative medications had been prescribed and our office would contact her about whether her medications could be delivered to her house.

## 2020-07-05 NOTE — Patient Instructions (Signed)
DUE TO COVID-19 ONLY ONE VISITOR IS ALLOWED IN WAITING ROOM (VISITOR WILL HAVE A TEMPERATURE CHECK ON ARRIVAL AND MUST WEAR A FACE MASK THE ENTIRE TIME.)  ONCE YOU ARE ADMITTED TO YOUR PRIVATE ROOM, THE SAME ONE VISITOR IS ALLOWED TO VISIT DURING VISITING HOURS ONLY.  Your COVID swab testing is scheduled for: 07/05/20  At 11:50 am  , You must self quarantine after your testing per handout given to you at the testing site. [address] [place], [place] [address]  (Must self quarantine after testing. Follow instructions on handout.)       Your procedure is scheduled on: 07/08/20  Report to [place] AT: 5:45  A. M.   Call this number if you have problems the morning of surgery:[phone].   OUR ADDRESS IS [address].  WE ARE LOCATED IN THE [place]                                   [facility].                                     REMEMBER:   DO NOT EAT FOOD OR DRINK LIQUIDS AFTER MIDNIGHT .   Eat a light diet the day before surgery.  Examples including soups, broths, toast, yogurt, mashed potatoes.  Things to avoid include carbonated beverages (fizzy beverages), raw fruits and raw vegetables, or beans.   If your bowels are filled with gas, your surgeon will have difficulty visualizing your pelvic organs which increases your surgical risks.  BRUSH YOUR TEETH THE MORNING OF SURGERY.   DO NOT WEAR JEWERLY, MAKE UP, OR NAIL POLISH.  DO NOT WEAR LOTIONS, POWDERS, PERFUMES/COLOGNE OR DEODORANT.  DO NOT SHAVE FOR 24 HOURS PRIOR TO DAY OF SURGERY.  CONTACTS, GLASSES, OR DENTURES MAY NOT BE WORN TO SURGERY.                                    [facility] IS NOT RESPONSIBLE  FOR ANY BELONGINGS.                                                                    [facility] - Preparing for Surgery Before surgery, you can play an important role.  Because skin is not sterile, your skin needs to be as free of germs as possible.  You can reduce the number of germs on  your skin by washing with CHG (chlorahexidine gluconate) soap before surgery.  CHG is an antiseptic cleaner which kills germs and bonds with the skin to continue killing germs even after washing. Please DO NOT use if you have an allergy to CHG or antibacterial soaps.  If your skin becomes reddened/irritated stop using the CHG and inform your nurse when you arrive at Short Stay. Do not shave (including legs and underarms) for at least 48 hours prior to the first CHG shower.  You may shave your face/neck. Please follow these instructions carefully:  1.  Shower with CHG Soap the  night before surgery and the  morning of Surgery.  2.  If you choose to wash your hair, wash your hair first as usual with your  normal  shampoo.  3.  After you shampoo, rinse your hair and body thoroughly to remove the  shampoo.                           4.  Use CHG as you would any other liquid soap.  You can apply chg directly  to the skin and wash                       Gently with a scrungie or clean washcloth.  5.  Apply the CHG Soap to your body ONLY FROM THE NECK DOWN.   Do not use on face/ open                           Wound or open sores. Avoid contact with eyes, ears mouth and genitals (private parts).                       Wash face,  Genitals (private parts) with your normal soap.             6.  Wash thoroughly, paying special attention to the area where your surgery  will be performed.  7.  Thoroughly rinse your body with warm water from the neck down.  8.  DO NOT shower/wash with your normal soap after using and rinsing off  the CHG Soap.                9.  Pat yourself dry with a clean towel.            10.  Wear clean pajamas.            11.  Place clean sheets on your bed the night of your first shower and do not  sleep with pets. Day of Surgery : Do not apply any lotions/deodorants the morning of surgery.  Please wear clean clothes to the hospital/surgery center.  FAILURE TO FOLLOW THESE INSTRUCTIONS MAY  RESULT IN THE CANCELLATION OF YOUR SURGERY PATIENT SIGNATURE_________________________________  NURSE SIGNATURE__________________________________  ________________________________________________________________________

## 2020-07-05 NOTE — Progress Notes (Signed)
COVID Vaccine Completed: Date COVID Vaccine completed: COVID vaccine manufacturer: [place]   PCP -  Cardiologist -   Chest x-ray -  EKG -  Stress Test -  ECHO -  Cardiac Cath -  Pacemaker/ICD device last checked:  Sleep Study -  CPAP -   Fasting Blood Sugar -  Checks Blood Sugar _____ times a day  Blood Thinner Instructions: Aspirin Instructions: Last Dose:  Anesthesia review:   Patient denies shortness of breath, fever, cough and chest pain at PAT appointment   Patient verbalized understanding of instructions that were given to them at the PAT appointment. Patient was also instructed that they will need to review over the PAT instructions again at home before surgery.COVID Vaccine Completed: NO Date COVID Vaccine completed: COVID vaccine manufacturer: [place]   PCP -  NO PCP Cardiologist -   Chest x-ray -  EKG -  Stress Test -  ECHO -  Cardiac Cath -  Pacemaker/ICD device last checked:  Sleep Study -  CPAP -   Fasting Blood Sugar -  Checks Blood Sugar _____ times a day  Blood Thinner Instructions: Aspirin Instructions: Last Dose:  Anesthesia review: Pt. Was advised about smoking on regard her upcomming surgery.Pt. verbalized,she is not sure if she will be able to stop smoking for 24 hours before surgery.  Patient denies shortness of breath, fever, cough and chest pain at PAT appointment   Patient verbalized understanding of instructions that were given to them at the PAT appointment. Patient was also instructed that they will need to review over the PAT instructions again at home before surgery.

## 2020-07-05 NOTE — Patient Instructions (Signed)
Preparing for your Surgery  Plan for surgery on July 08, 2020 with Dr. Jeral Pinch at [facility]. You will be scheduled for a robotic assisted laparoscopic unilateral salpingo-oophorectomy (removal of one ovary and fallopian tube), possible mini laparotomy (larger incision on your abdomen if needed), possible bilateral salpingo-oophorectomy (removal of both ovaries and fallopian tubes), possible total laparoscopic hysterectomy (removal of the uterus and cervix), possible staging if a cancer is identified.   Pre-operative Testing -You will receive a phone call from presurgical testing at [facility] to arrange for a pre-operative appointment, labs, and COVID test. The COVID test normally happens 3 days prior to the surgery and they ask that you self quarantine after the test up until surgery to decrease chance of exposure.  -Bring your insurance card, copy of an advanced directive if applicable, medication list  -At that visit, you will be asked to sign a consent for a possible blood transfusion in case a transfusion becomes necessary during surgery.  The need for a blood transfusion is rare but having consent is a necessary part of your care.     -You should not be taking blood thinners or aspirin at least ten days prior to surgery unless instructed by your surgeon. DO NOT USE THE GOODY'S HEADACHE POWDERS NOW UNTIL SURGERY SINCE IT CONTAINS ASPIRIN.  -Do not take supplements such as fish oil (omega 3), red yeast rice, turmeric before your surgery.   Day Before Surgery at [place] will be asked to take in a light diet the day before surgery. You will be advised you can have clear liquids up until 3 hours before your surgery.    Eat a light diet the day before surgery.  Examples including soups, broths, toast, yogurt, mashed potatoes.  AVOID GAS PRODUCING FOODS. Things to avoid include carbonated beverages (fizzy beverages), raw fruits and raw  vegetables, or beans.   If your bowels are filled with gas, your surgeon will have difficulty visualizing your pelvic organs which increases your surgical risks.  Your role in recovery Your role is to become active as soon as directed by your doctor, while still giving yourself time to heal.  Rest when you feel tired. You will be asked to do the following in order to speed your recovery:  - Cough and breathe deeply. This helps to clear and expand your lungs and can prevent pneumonia after surgery.  - [place]. Do mild physical activity. Walking or moving your legs help your circulation and body functions return to normal. Do not try to get up or walk alone the first time after surgery.   -If you develop swelling on one leg or the other, pain in the back of your leg, redness/warmth in one of your legs, please call the office or go to the Emergency Room to have a doppler to rule out a blood clot. For shortness of breath, chest pain-seek care in the Emergency Room as soon as possible. - Actively manage your pain. Managing your pain lets you move in comfort. We will ask you to rate your pain on a scale of zero to 10. It is your responsibility to tell your doctor or nurse where and how much you hurt so your pain can be treated.  Special Considerations -If you are diabetic, you may be placed on insulin after surgery to have closer control over your blood sugars to promote healing and recovery.  This does not mean that you  will be discharged on insulin.  If applicable, your oral antidiabetics will be resumed when you are tolerating a solid diet.  -Your final pathology results from surgery should be available around one week after surgery and the results will be relayed to you when available.  -Dr. Lahoma Crocker is the surgeon that assists your GYN Oncologist with surgery.  If you end up staying the night, the next day after your surgery you will either see Dr. Denman George, Dr. Berline Lopes,  or Dr. Lahoma Crocker.  -FMLA forms can be faxed to [phone] and please allow 5-7 business days for completion.  Pain Management After Surgery -You have been prescribed your pain medication and bowel regimen medications before surgery so that you can have these available when you are discharged from the hospital. The pain medication is for use ONLY AFTER surgery and a new prescription will not be given.   -Make sure that you have Tylenol and Ibuprofen at home to use on a regular basis after surgery for pain control. We recommend alternating the medications every hour to six hours since they work differently and are processed in the body differently for pain relief. USE THE IBUPROFEN SPARINGLY SINCE IT CAN CAUSE RENAL ISSUES WHEN TAKING WITH YOUR OTHER MEDICATIONS.  -Review the attached handout on narcotic use and their risks and side effects.   Bowel Regimen -You have been prescribed Sennakot-S to take nightly to prevent constipation especially if you are taking the narcotic pain medication intermittently.  It is important to prevent constipation and drink adequate amounts of liquids. You can stop taking this medication when you are not taking pain medication and you are back on your normal bowel routine.  Risks of Surgery Risks of surgery are low but include bleeding, infection, damage to surrounding structures, re-operation, blood clots, and very rarely death.   Blood Transfusion Information (For the consent to be signed before surgery)  We will be checking your blood type before surgery so in case of emergencies, we will know what type of blood you would need.                                            WHAT IS A BLOOD TRANSFUSION?  A transfusion is the replacement of blood or some of its parts. Blood is made up of multiple cells which provide different functions.  Red blood cells carry oxygen and are used for blood loss replacement.  White blood cells fight against  infection.  Platelets control bleeding.  Plasma helps clot blood.  Other blood products are available for specialized needs, such as hemophilia or other clotting disorders. BEFORE THE TRANSFUSION  Who gives blood for transfusions?   You may be able to donate blood to be used at a later date on yourself (autologous donation).  Relatives can be asked to donate blood. This is generally not any safer than if you have received blood from a stranger. The same precautions are taken to ensure safety when a relative's blood is donated.  Healthy volunteers who are fully evaluated to make sure their blood is safe. This is blood bank blood. Transfusion therapy is the safest it has ever been in the practice of medicine. Before blood is taken from a donor, a complete history is taken to make sure that person has no history of diseases nor engages in risky social behavior (examples are intravenous drug  use or sexual activity with multiple partners). The donor's travel history is screened to minimize risk of transmitting infections, such as malaria. The donated blood is tested for signs of infectious diseases, such as HIV and hepatitis. The blood is then tested to be sure it is compatible with you in order to minimize the chance of a transfusion reaction. If you or a relative donates blood, this is often done in anticipation of surgery and is not appropriate for emergency situations. It takes many days to process the donated blood. RISKS AND COMPLICATIONS Although transfusion therapy is very safe and saves many lives, the main dangers of transfusion include:   Getting an infectious disease.  Developing a transfusion reaction. This is an allergic reaction to something in the blood you were given. Every precaution is taken to prevent this. The decision to have a blood transfusion has been considered carefully by your caregiver before blood is given. Blood is not given unless the benefits outweigh the  risks.  AFTER SURGERY INSTRUCTIONS  Return to work: 2-4 weeks if applicable  Activity: 1. Be up and out of the bed during the day.  Take a nap if needed.  You may walk up steps but be careful and use the hand rail.  Stair climbing will tire you more than you think, you may need to stop part way and rest.   2. No lifting or straining for 6 weeks over 10 pounds. No pushing, pulling, straining for 6 weeks.  3. No driving for 1 week(s).  Do not drive if you are taking narcotic pain medicine and make sure that your reaction time has returned.   4. You can shower as soon as the next day after surgery. Shower daily.  Use your regular soap and water (not directly on the incision) and pat your incision(s) dry afterwards; don't rub.  No tub baths or submerging your body in water until cleared by your surgeon. If you have the soap that was given to you by pre-surgical testing that was used before surgery, you do not need to use it afterwards because this can irritate your incisions.   5. No sexual activity and nothing in the vagina for 8 weeks (IF YOU HAVE A HYSTERECTOMY).  6. You may experience a small amount of clear drainage from your incisions, which is normal.  If the drainage persists, increases, or changes color please call the office.  7. Do not use creams, lotions, or ointments such as neosporin on your incisions after surgery until advised by your surgeon because they can cause removal of the dermabond glue on your incisions.    8. You may experience vaginal spotting after surgery or around the 6-8 week mark from surgery when the stitches at the top of the vagina begin to dissolve (if you have a hysterectomy).  The spotting is normal but if you experience heavy bleeding, call our office.  9. Take Tylenol or ibuprofen first for pain and only use narcotic pain medication for severe pain not relieved by the Tylenol or Ibuprofen.  Monitor your Tylenol intake to a max of 4,000 mg in a 24 hour  period. You can alternate these medications after surgery.  Diet: 1. Low sodium Heart Healthy Diet is recommended but you are cleared to resume your normal (before surgery) diet after your procedure.  2. It is safe to use a laxative, such as Miralax or Colace, if you have difficulty moving your bowels. You have been prescribed Sennakot at bedtime every evening  to keep bowel movements regular and to prevent constipation.    Wound Care: 1. Keep clean and dry.  Shower daily.  Reasons to call the Doctor:  Fever - Oral temperature greater than 100.4 degrees Fahrenheit  Foul-smelling vaginal discharge  Difficulty urinating  Nausea and vomiting  Increased pain at the site of the incision that is unrelieved with pain medicine.  Difficulty breathing with or without chest pain  New calf pain especially if only on one side  Sudden, continuing increased vaginal bleeding with or without clots.   Contacts: For questions or concerns you should contact:  Dr. Jeral Pinch at [phone]  Joylene John, NP at [phone]  After Hours: call [phone] and have the GYN Oncologist paged/contacted (after 5 pm or on the weekends)

## 2020-07-05 NOTE — Telephone Encounter (Signed)
Reviewed with Tiffany Ray that the post op meds can be delivered to PACU on 07-08-20.  She needs to call [facility] pharmacy at [phone] to pay for her medications.  The senokot-s is OTC so insurance will not cover this.  Pt verbalized understanding. Gave her the letter for her employer as she requested.

## 2020-07-06 ENCOUNTER — Telehealth: Payer: Self-pay

## 2020-07-06 MED FILL — SYMTUZA 800-150-200-10 MG T: 800-150-200 | 30 days supply | Qty: 30 | Fill #3

## 2020-07-06 NOTE — Telephone Encounter (Signed)
LM for Tiffany Ray to call back to discuss options for pre surgical soap.

## 2020-07-07 ENCOUNTER — Encounter: Payer: Self-pay | Admitting: Gynecologic Oncology

## 2020-07-07 NOTE — Anesthesia Preprocedure Evaluation (Addendum)
Anesthesia Evaluation  Patient identified by MRN, date of birth, ID band Patient awake    Reviewed: Allergy & Precautions, NPO status , Patient's Chart, lab work & pertinent test results  Airway Mallampati: II       Dental no notable dental hx.    Pulmonary Current Smoker and Patient abstained from smoking.,    Pulmonary exam normal        Cardiovascular negative cardio ROS Normal cardiovascular exam     Neuro/Psych negative neurological ROS  negative psych ROS   GI/Hepatic negative GI ROS, Neg liver ROS,   Endo/Other  Morbid obesity  Renal/GU negative Renal ROS  negative genitourinary   Musculoskeletal negative musculoskeletal ROS (+)   Abdominal (+) + obese,   Peds  Hematology   Anesthesia Other Findings   Reproductive/Obstetrics                            Anesthesia Physical Anesthesia Plan  ASA: III  Anesthesia Plan: General   Post-op Pain Management:    Induction: Intravenous  PONV Risk Score and Plan: 4 or greater and Ondansetron, Dexamethasone and Midazolam  Airway Management Planned: Oral ETT  Additional Equipment: None  Intra-op Plan:   Post-operative Plan: Extubation in OR  Informed Consent: I have reviewed the patients History and Physical, chart, labs and discussed the procedure including the risks, benefits and alternatives for the proposed anesthesia with the patient or authorized representative who has indicated his/her understanding and acceptance.     Dental advisory given  Plan Discussed with:   Anesthesia Plan Comments:        Anesthesia Quick Evaluation

## 2020-07-07 NOTE — Telephone Encounter (Signed)
Tiffany Ray states that she understands written pre-op instructions from 07-05-20. She has no questions or concerns at this time. She states that she is going to presurgical testing today to pick up the CHG soap.

## 2020-07-08 ENCOUNTER — Ambulatory Visit (HOSPITAL_BASED_OUTPATIENT_CLINIC_OR_DEPARTMENT_OTHER): Payer: Medicaid Other | Admitting: Anesthesiology

## 2020-07-08 ENCOUNTER — Ambulatory Visit (HOSPITAL_BASED_OUTPATIENT_CLINIC_OR_DEPARTMENT_OTHER)
Admission: RE | Admit: 2020-07-08 | Discharge: 2020-07-08 | Disposition: A | Discharge status: Home / Self Care | Payer: Medicaid Other | Attending: Gynecologic Oncology | Admitting: Gynecologic Oncology

## 2020-07-08 ENCOUNTER — Encounter (HOSPITAL_BASED_OUTPATIENT_CLINIC_OR_DEPARTMENT_OTHER): Payer: Self-pay | Admitting: Gynecologic Oncology

## 2020-07-08 ENCOUNTER — Other Ambulatory Visit: Payer: Self-pay

## 2020-07-08 ENCOUNTER — Encounter (HOSPITAL_BASED_OUTPATIENT_CLINIC_OR_DEPARTMENT_OTHER)
Admission: RE | Disposition: A | Discharge status: Home / Self Care | Payer: Self-pay | Source: Home / Self Care | Attending: Gynecologic Oncology

## 2020-07-08 DIAGNOSIS — R1909 Other intra-abdominal and pelvic swelling, mass and lump: Secondary | ICD-10-CM | POA: Diagnosis present

## 2020-07-08 DIAGNOSIS — N801 Endometriosis of ovary: Secondary | ICD-10-CM | POA: Insufficient documentation

## 2020-07-08 DIAGNOSIS — N809 Endometriosis, unspecified: Secondary | ICD-10-CM

## 2020-07-08 DIAGNOSIS — F17200 Nicotine dependence, unspecified, uncomplicated: Secondary | ICD-10-CM | POA: Diagnosis not present

## 2020-07-08 DIAGNOSIS — N736 Female pelvic peritoneal adhesions (postinfective): Secondary | ICD-10-CM | POA: Insufficient documentation

## 2020-07-08 DIAGNOSIS — Z6839 Body mass index (BMI) 39.0-39.9, adult: Secondary | ICD-10-CM | POA: Insufficient documentation

## 2020-07-08 DIAGNOSIS — D649 Anemia, unspecified: Secondary | ICD-10-CM | POA: Diagnosis not present

## 2020-07-08 DIAGNOSIS — R7303 Prediabetes: Secondary | ICD-10-CM | POA: Diagnosis not present

## 2020-07-08 DIAGNOSIS — N83292 Other ovarian cyst, left side: Secondary | ICD-10-CM | POA: Insufficient documentation

## 2020-07-08 DIAGNOSIS — N9489 Other specified conditions associated with female genital organs and menstrual cycle: Secondary | ICD-10-CM | POA: Insufficient documentation

## 2020-07-08 DIAGNOSIS — R102 Pelvic and perineal pain: Secondary | ICD-10-CM

## 2020-07-08 DIAGNOSIS — D270 Benign neoplasm of right ovary: Secondary | ICD-10-CM | POA: Diagnosis not present

## 2020-07-08 DIAGNOSIS — N888 Other specified noninflammatory disorders of cervix uteri: Secondary | ICD-10-CM | POA: Diagnosis not present

## 2020-07-08 DIAGNOSIS — R1900 Intra-abdominal and pelvic swelling, mass and lump, unspecified site: Secondary | ICD-10-CM

## 2020-07-08 DIAGNOSIS — R971 Elevated cancer antigen 125 [CA 125]: Secondary | ICD-10-CM | POA: Diagnosis not present

## 2020-07-08 HISTORY — PX: ROBOTIC ASSISTED SALPINGO OOPHERECTOMY: SHX6082

## 2020-07-08 LAB — TYPE AND SCREEN
ABO/RH(D): A POS
Antibody Screen: NEGATIVE

## 2020-07-08 LAB — ABO/RH: ABO/RH(D): A POS

## 2020-07-08 LAB — POCT PREGNANCY, URINE: Preg Test, Ur: NEGATIVE

## 2020-07-08 SURGERY — SALPINGO-OOPHORECTOMY, ROBOT-ASSISTED
Anesthesia: General | Site: Abdomen

## 2020-07-08 MED ORDER — ACETAMINOPHEN 325 MG RE SUPP: 650.0000 mg | RECTAL | Status: DC | PRN

## 2020-07-08 MED ORDER — SUGAMMADEX SODIUM 200 MG/2ML IV SOLN
INTRAVENOUS | Status: DC | PRN
  Administered 2020-07-08 (×2): 200 mg via INTRAVENOUS

## 2020-07-08 MED ORDER — SODIUM CHLORIDE 0.9 % IV SOLN
INTRAVENOUS | Status: AC
  Filled 2020-07-08: qty 100

## 2020-07-08 MED ORDER — LACTATED RINGERS IV SOLN: INTRAVENOUS | Status: DC

## 2020-07-08 MED ORDER — BUPIVACAINE HCL 0.25 % IJ SOLN
INTRAMUSCULAR | Status: DC | PRN
  Administered 2020-07-08: 20 mL

## 2020-07-08 MED ORDER — SCOPOLAMINE 1 MG/3DAYS TD PT72
1.0000 | MEDICATED_PATCH | TRANSDERMAL | Status: DC
  Administered 2020-07-08: 06:00:00 1.5 mg via TRANSDERMAL

## 2020-07-08 MED ORDER — KETOROLAC TROMETHAMINE 30 MG/ML IJ SOLN: 30.0000 mg | Freq: Once | INTRAMUSCULAR | Status: DC | PRN

## 2020-07-08 MED ORDER — WHITE PETROLATUM EX OINT
TOPICAL_OINTMENT | CUTANEOUS | Status: AC
  Filled 2020-07-08: qty 5

## 2020-07-08 MED ORDER — HEPARIN SODIUM (PORCINE) 5000 UNIT/ML IJ SOLN
5000.0000 [IU] | INTRAMUSCULAR | Status: AC
  Administered 2020-07-08: 07:00:00 5000 [IU] via SUBCUTANEOUS

## 2020-07-08 MED ORDER — ROCURONIUM BROMIDE 10 MG/ML (PF) SYRINGE
PREFILLED_SYRINGE | INTRAVENOUS | Status: DC | PRN
  Administered 2020-07-08: 80 mg via INTRAVENOUS

## 2020-07-08 MED ORDER — FENTANYL CITRATE (PF) 100 MCG/2ML IJ SOLN
INTRAMUSCULAR | Status: DC | PRN
  Administered 2020-07-08 (×2): 50 ug via INTRAVENOUS
  Administered 2020-07-08: 100 ug via INTRAVENOUS

## 2020-07-08 MED ORDER — OXYCODONE HCL 5 MG PO TABS: 5.0000 mg | ORAL_TABLET | ORAL | Status: DC | PRN

## 2020-07-08 MED ORDER — MORPHINE SULFATE (PF) 4 MG/ML IV SOLN: 2.0000 mg | INTRAVENOUS | Status: DC | PRN

## 2020-07-08 MED ORDER — CELECOXIB 200 MG PO CAPS
ORAL_CAPSULE | ORAL | Status: AC
  Filled 2020-07-08: qty 2

## 2020-07-08 MED ORDER — SODIUM CHLORIDE 0.9% FLUSH: 3.0000 mL | Freq: Two times a day (BID) | INTRAVENOUS | Status: DC

## 2020-07-08 MED ORDER — SODIUM CHLORIDE 0.9% FLUSH: 3.0000 mL | INTRAVENOUS | Status: DC | PRN

## 2020-07-08 MED ORDER — LIDOCAINE HCL (PF) 2 % IJ SOLN
INTRAMUSCULAR | Status: DC | PRN
  Administered 2020-07-08: 1.5 mg/kg/h via INTRADERMAL

## 2020-07-08 MED ORDER — CHLORHEXIDINE GLUCONATE 0.12 % MT SOLN: 15.0000 mL | Freq: Once | OROMUCOSAL | Status: DC

## 2020-07-08 MED ORDER — PROPOFOL 10 MG/ML IV BOLUS
INTRAVENOUS | Status: DC | PRN
  Administered 2020-07-08: 200 mg via INTRAVENOUS

## 2020-07-08 MED ORDER — HYDROMORPHONE HCL 1 MG/ML IJ SOLN
0.2500 mg | INTRAMUSCULAR | Status: DC | PRN
  Administered 2020-07-08: 0.25 mg via INTRAVENOUS
  Administered 2020-07-08: 0.5 mg via INTRAVENOUS
  Administered 2020-07-08: 0.25 mg via INTRAVENOUS

## 2020-07-08 MED ORDER — DEXTROSE 5 % IV SOLN
INTRAVENOUS | Status: DC | PRN
  Administered 2020-07-08: 09:00:00 2 g via INTRAVENOUS

## 2020-07-08 MED ORDER — ACETAMINOPHEN 500 MG PO TABS
ORAL_TABLET | ORAL | Status: AC
  Filled 2020-07-08: qty 2

## 2020-07-08 MED ORDER — GABAPENTIN 300 MG PO CAPS
300.0000 mg | ORAL_CAPSULE | ORAL | Status: AC
  Administered 2020-07-08: 07:00:00 300 mg via ORAL

## 2020-07-08 MED ORDER — SODIUM CHLORIDE 0.9 % IV SOLN: 250.0000 mL | INTRAVENOUS | Status: DC | PRN

## 2020-07-08 MED ORDER — HEPARIN SODIUM (PORCINE) 5000 UNIT/ML IJ SOLN
INTRAMUSCULAR | Status: AC
  Filled 2020-07-08: qty 1

## 2020-07-08 MED ORDER — FENTANYL CITRATE (PF) 250 MCG/5ML IJ SOLN
INTRAMUSCULAR | Status: AC
  Filled 2020-07-08: qty 5

## 2020-07-08 MED ORDER — DEXAMETHASONE SODIUM PHOSPHATE 10 MG/ML IJ SOLN: 4.0000 mg | INTRAMUSCULAR | Status: DC

## 2020-07-08 MED ORDER — SODIUM CHLORIDE (PF) 0.9 % IJ SOLN
INTRAMUSCULAR | Status: AC
  Filled 2020-07-08: qty 10

## 2020-07-08 MED ORDER — LIDOCAINE 2% (20 MG/ML) 5 ML SYRINGE
INTRAMUSCULAR | Status: DC | PRN
  Administered 2020-07-08: 40 mg via INTRAVENOUS

## 2020-07-08 MED ORDER — SODIUM CHLORIDE 0.9 % IV SOLN: INTRAVENOUS | Status: DC | PRN

## 2020-07-08 MED ORDER — SCOPOLAMINE 1 MG/3DAYS TD PT72
MEDICATED_PATCH | TRANSDERMAL | Status: AC
  Filled 2020-07-08: qty 1

## 2020-07-08 MED ORDER — SODIUM CHLORIDE 0.9 % IR SOLN
Status: DC | PRN
  Administered 2020-07-08: 1000 mL

## 2020-07-08 MED ORDER — PROPOFOL 10 MG/ML IV BOLUS
INTRAVENOUS | Status: AC
  Filled 2020-07-08: qty 20

## 2020-07-08 MED ORDER — CELECOXIB 200 MG PO CAPS
400.0000 mg | ORAL_CAPSULE | ORAL | Status: AC
  Administered 2020-07-08: 07:00:00 400 mg via ORAL

## 2020-07-08 MED ORDER — HYDROMORPHONE HCL 1 MG/ML IJ SOLN
INTRAMUSCULAR | Status: AC
  Filled 2020-07-08: qty 1

## 2020-07-08 MED ORDER — CEFOXITIN SODIUM 2 G IV SOLR
INTRAVENOUS | Status: AC
  Filled 2020-07-08: qty 2

## 2020-07-08 MED ORDER — LIDOCAINE HCL (PF) 2 % IJ SOLN
INTRAMUSCULAR | Status: AC
  Filled 2020-07-08: qty 5

## 2020-07-08 MED ORDER — MIDAZOLAM HCL 5 MG/5ML IJ SOLN
INTRAMUSCULAR | Status: DC | PRN
  Administered 2020-07-08: 2 mg via INTRAVENOUS

## 2020-07-08 MED ORDER — ONDANSETRON HCL 4 MG/2ML IJ SOLN
INTRAMUSCULAR | Status: DC | PRN
  Administered 2020-07-08: 4 mg via INTRAVENOUS

## 2020-07-08 MED ORDER — MEPERIDINE HCL 25 MG/ML IJ SOLN
6.2500 mg | INTRAMUSCULAR | Status: DC | PRN
Start: 2020-07-08 — End: 2020-07-08

## 2020-07-08 MED ORDER — ORAL CARE MOUTH RINSE: 15.0000 mL | Freq: Once | OROMUCOSAL | Status: DC

## 2020-07-08 MED ORDER — ROCURONIUM BROMIDE 10 MG/ML (PF) SYRINGE
PREFILLED_SYRINGE | INTRAVENOUS | Status: AC
  Filled 2020-07-08: qty 10

## 2020-07-08 MED ORDER — ONDANSETRON HCL 4 MG/2ML IJ SOLN
INTRAMUSCULAR | Status: AC
  Filled 2020-07-08: qty 2

## 2020-07-08 MED ORDER — DEXAMETHASONE SODIUM PHOSPHATE 10 MG/ML IJ SOLN
INTRAMUSCULAR | Status: DC | PRN
  Administered 2020-07-08: 10 mg via INTRAVENOUS

## 2020-07-08 MED ORDER — PROMETHAZINE HCL 25 MG/ML IJ SOLN: 6.2500 mg | INTRAMUSCULAR | Status: DC | PRN

## 2020-07-08 MED ORDER — ACETAMINOPHEN 500 MG PO TABS
1000.0000 mg | ORAL_TABLET | ORAL | Status: AC
  Administered 2020-07-08: 07:00:00 1000 mg via ORAL

## 2020-07-08 MED ORDER — ACETAMINOPHEN 325 MG PO TABS: 650.0000 mg | ORAL_TABLET | ORAL | Status: DC | PRN

## 2020-07-08 MED ORDER — MIDAZOLAM HCL 2 MG/2ML IJ SOLN
INTRAMUSCULAR | Status: AC
  Filled 2020-07-08: qty 2

## 2020-07-08 MED ORDER — DEXMEDETOMIDINE (PRECEDEX) IN NS 20 MCG/5ML (4 MCG/ML) IV SYRINGE
PREFILLED_SYRINGE | INTRAVENOUS | Status: AC
  Filled 2020-07-08: qty 5

## 2020-07-08 MED ORDER — LIDOCAINE HCL 2 % IJ SOLN
INTRAMUSCULAR | Status: AC
  Filled 2020-07-08: qty 40

## 2020-07-08 MED ORDER — DEXAMETHASONE SODIUM PHOSPHATE 10 MG/ML IJ SOLN
INTRAMUSCULAR | Status: AC
  Filled 2020-07-08: qty 1

## 2020-07-08 MED ORDER — KETOROLAC TROMETHAMINE 15 MG/ML IJ SOLN: 15.0000 mg | Freq: Once | INTRAMUSCULAR | Status: DC | PRN

## 2020-07-08 MED ORDER — DEXMEDETOMIDINE (PRECEDEX) IN NS 20 MCG/5ML (4 MCG/ML) IV SYRINGE
PREFILLED_SYRINGE | INTRAVENOUS | Status: DC | PRN
  Administered 2020-07-08: 12 ug via INTRAVENOUS
  Administered 2020-07-08: 8 ug via INTRAVENOUS

## 2020-07-08 MED ORDER — LIDOCAINE HCL (PF) 2 % IJ SOLN: INTRAMUSCULAR | Status: DC | PRN

## 2020-07-08 MED ORDER — GABAPENTIN 300 MG PO CAPS
ORAL_CAPSULE | ORAL | Status: AC
  Filled 2020-07-08: qty 1

## 2020-07-08 SURGICAL SUPPLY — 69 items
APPLICATOR COTTON TIP 6 STRL (MISCELLANEOUS) ×2 IMPLANT
APPLICATOR COTTON TIP 6IN STRL (MISCELLANEOUS) ×4
APPLICATOR SURGIFLO ENDO (HEMOSTASIS) IMPLANT
BAG LAPAROSCOPIC 12 15 PORT 16 (BASKET) ×2 IMPLANT
BAG RETRIEVAL 12/15 (BASKET) ×3
BAG RETRIEVAL 12/15MM (BASKET) ×1
BLADE SURG 10 STRL SS (BLADE) IMPLANT
COVER BACK TABLE 60X90IN (DRAPES) ×4 IMPLANT
COVER TIP SHEARS 8 DVNC (MISCELLANEOUS) ×2 IMPLANT
COVER TIP SHEARS 8MM DA VINCI (MISCELLANEOUS) ×2
COVER WAND RF STERILE (DRAPES) ×4 IMPLANT
DECANTER SPIKE VIAL GLASS SM (MISCELLANEOUS) IMPLANT
DERMABOND ADVANCED (GAUZE/BANDAGES/DRESSINGS) ×2
DERMABOND ADVANCED .7 DNX12 (GAUZE/BANDAGES/DRESSINGS) ×2 IMPLANT
DRAPE ARM DVNC X/XI (DISPOSABLE) ×8 IMPLANT
DRAPE COLUMN DVNC XI (DISPOSABLE) ×2 IMPLANT
DRAPE DA VINCI XI ARM (DISPOSABLE) ×8
DRAPE DA VINCI XI COLUMN (DISPOSABLE) ×2
DRAPE SHEET LG 3/4 BI-LAMINATE (DRAPES) ×4 IMPLANT
DRAPE SURG IRRIG POUCH 19X23 (DRAPES) ×4 IMPLANT
ELECT REM PT RETURN 9FT ADLT (ELECTROSURGICAL) ×4
ELECTRODE REM PT RTRN 9FT ADLT (ELECTROSURGICAL) ×2 IMPLANT
GAUZE 4X4 16PLY RFD (DISPOSABLE) ×4 IMPLANT
GLOVE BIO SURGEON STRL SZ 6 (GLOVE) ×16 IMPLANT
GLOVE BIO SURGEON STRL SZ 6.5 (GLOVE) ×6 IMPLANT
GLOVE BIO SURGEONS STRL SZ 6.5 (GLOVE) ×2
HOLDER FOLEY CATH W/STRAP (MISCELLANEOUS) ×4 IMPLANT
IRRIG SUCT STRYKERFLOW 2 WTIP (MISCELLANEOUS) ×4
IRRIGATION SUCT STRKRFLW 2 WTP (MISCELLANEOUS) ×2 IMPLANT
KIT PROCEDURE DA VINCI SI (MISCELLANEOUS)
KIT PROCEDURE DVNC SI (MISCELLANEOUS) IMPLANT
KIT TURNOVER CYSTO (KITS) ×4 IMPLANT
LEGGING LITHOTOMY PAIR STRL (DRAPES) ×4 IMPLANT
MANIPULATOR UTERINE 4.5 ZUMI (MISCELLANEOUS) ×4 IMPLANT
NDL SAFETY ECLIPSE 18X1.5 (NEEDLE) IMPLANT
NEEDLE HYPO 18GX1.5 SHARP (NEEDLE)
NEEDLE HYPO 22GX1.5 SAFETY (NEEDLE) ×4 IMPLANT
NEEDLE SPNL 18GX3.5 QUINCKE PK (NEEDLE) IMPLANT
OBTURATOR OPTICAL STANDARD 8MM (TROCAR) ×2
OBTURATOR OPTICAL STND 8 DVNC (TROCAR) ×2
OBTURATOR OPTICALSTD 8 DVNC (TROCAR) ×2 IMPLANT
PACK ROBOT GYN CUSTOM WL (TRAY / TRAY PROCEDURE) ×4 IMPLANT
PACK ROBOTIC GOWN (GOWN DISPOSABLE) ×4 IMPLANT
PAD POSITIONING PINK XL (MISCELLANEOUS) ×4 IMPLANT
PENCIL SMOKE EVACUATOR (MISCELLANEOUS) IMPLANT
PORT ACCESS TROCAR AIRSEAL 12 (TROCAR) ×2 IMPLANT
PORT ACCESS TROCAR AIRSEAL 5M (TROCAR) ×2
PORT LAP GEL ALEXIS MED 5-9CM (MISCELLANEOUS) IMPLANT
POUCH SPECIMEN RETRIEVAL 10MM (ENDOMECHANICALS) IMPLANT
SEAL CANN UNIV 5-8 DVNC XI (MISCELLANEOUS) ×6 IMPLANT
SEAL XI 5MM-8MM UNIVERSAL (MISCELLANEOUS) ×6
SET TRI-LUMEN FLTR TB AIRSEAL (TUBING) ×4 IMPLANT
SURGIFLO W/THROMBIN 8M KIT (HEMOSTASIS) IMPLANT
SUT MNCRL AB 4-0 PS2 18 (SUTURE) IMPLANT
SUT VIC AB 0 CT1 27 (SUTURE)
SUT VIC AB 0 CT1 27XBRD ANTBC (SUTURE) IMPLANT
SUT VIC AB 3-0 SH 27 (SUTURE)
SUT VIC AB 3-0 SH 27X BRD (SUTURE) IMPLANT
SUT VIC AB 4-0 PS2 18 (SUTURE) ×8 IMPLANT
SYR 10ML LL (SYRINGE) IMPLANT
SYR CONTROL 10ML LL (SYRINGE) ×8 IMPLANT
TRAP SPECIMEN MUCUS 40CC (MISCELLANEOUS) ×4 IMPLANT
TRAY FOLEY W/BAG SLVR 14FR LF (SET/KITS/TRAYS/PACK) ×4 IMPLANT
TROCAR BLADELESS OPT 12M 100M (ENDOMECHANICALS) IMPLANT
TUBE CONNECTING 12'X1/4 (SUCTIONS) ×1
TUBE CONNECTING 12X1/4 (SUCTIONS) ×3 IMPLANT
UNDERPAD 30X36 HEAVY ABSORB (UNDERPADS AND DIAPERS) ×4 IMPLANT
WATER STERILE IRR 1000ML POUR (IV SOLUTION) ×4 IMPLANT
YANKAUER SUCT BULB TIP NO VENT (SUCTIONS) IMPLANT

## 2020-07-08 NOTE — Transfer of Care (Signed)
Immediate Anesthesia Transfer of Care Note  Patient: Tiffany Ray  Procedure(s) Performed: XI ROBOTIC ASSISTED LEFT SALPINGO OOPHORECTOMY, RIGHT SALPINGECTOMY (Bilateral Abdomen)  Patient Location: PACU  Anesthesia Type:General  Level of Consciousness: drowsy, patient cooperative and responds to stimulation  Airway & Oxygen Therapy: Patient Spontanous Breathing  Post-op Assessment: Report given to RN and Post -op Vital signs reviewed and stable  Post vital signs: Reviewed and stable  Last Vitals:  Vitals Value Taken Time  BP 137/87 07/08/20 0945  Temp    Pulse    Resp 22 07/08/20 0951  SpO2    Vitals shown include unvalidated device data.  Last Pain:  Vitals:   07/08/20 0559  TempSrc: Oral         Complications: No complications documented.

## 2020-07-08 NOTE — Anesthesia Postprocedure Evaluation (Signed)
Anesthesia Post Note  Patient: Tiffany Ray  Procedure(s) Performed: XI ROBOTIC ASSISTED LEFT SALPINGO OOPHORECTOMY, RIGHT SALPINGECTOMY (Bilateral Abdomen)     Patient location during evaluation: Phase II Anesthesia Type: General Level of consciousness: awake and sedated Pain management: pain level controlled Vital Signs Assessment: post-procedure vital signs reviewed and stable Respiratory status: spontaneous breathing Cardiovascular status: stable Postop Assessment: no apparent nausea or vomiting Anesthetic complications: no   No complications documented.  Last Vitals:  Vitals:   07/08/20 1000 07/08/20 1019  BP: (!) 138/100 (!) 146/98  Pulse:    Resp: 18 15  Temp:    SpO2: 98% 100%    Last Pain:  Vitals:   07/08/20 1045  TempSrc:   PainSc: 10-Worst pain ever                 Huston Foley

## 2020-07-08 NOTE — Anesthesia Procedure Notes (Signed)
Procedure Name: Intubation Date/Time: 07/08/2020 7:37 AM Performed by: Rogers Blocker, CRNA Pre-anesthesia Checklist: Patient identified, Emergency Drugs available, Suction available and Patient being monitored Patient Re-evaluated:Patient Re-evaluated prior to induction Oxygen Delivery Method: Circle System Utilized Preoxygenation: Pre-oxygenation with 100% oxygen Induction Type: IV induction Ventilation: Mask ventilation without difficulty Laryngoscope Size: Mac and 3 Grade View: Grade I Tube type: Oral Number of attempts: 1 Airway Equipment and Method: Stylet and Oral airway Placement Confirmation: ETT inserted through vocal cords under direct vision,  positive ETCO2 and breath sounds checked- equal and bilateral Secured at: 22 cm Tube secured with: Tape Dental Injury: Teeth and Oropharynx as per pre-operative assessment

## 2020-07-08 NOTE — Interval H&P Note (Signed)
History and Physical Interval Note: Plan for UO and BS, sending ovary for frozen section, staging if malignancy identified.  07/08/2020 7:00 AM  Tiffany Ray  has presented today for surgery, with the diagnosis of OVARIAN MASS.  The various methods of treatment have been discussed with the patient and family. After consideration of risks, benefits and other options for treatment, the patient has consented to  Procedure(s): XI ROBOTIC ASSISTED UNILATERAL SALPINGO OOPHORECTOMY (N/A) XI ROBOTIC ASSISTED TOTAL HYSTERECTOMY WITH BILATERAL SALPINGO OOPHORECTOMY (N/A) MINI LAPAROTOMY WITH STAGING (N/A) as a surgical intervention.  The patient's history has been reviewed, patient examined, no change in status, stable for surgery.  I have reviewed the patient's chart and labs.  Questions were answered to the patient's satisfaction.     Lafonda Mosses

## 2020-07-08 NOTE — Op Note (Signed)
OPERATIVE NOTE  Pre-operative Diagnosis: Complex adnexal mass, mildly elevated CA-125  Post-operative Diagnosis: same, suspected endometriosis, suspected history of PID  Operation: Robotic-assisted laparoscopic left salpingoophorectomy, left ureterolysis, right partial salpingectomy   Surgeon: Jeral Pinch MD  Assistant Surgeon: Lahoma Crocker MD (an MD assistant was necessary for tissue manipulation, management of robotic instrumentation, retraction and positioning due to the complexity of the case and hospital policies).   Anesthesia: GET  Urine Output: 30 cc  Operative Findings:  On EUA, 10cm mobile uterus, smooth fullness with minimal mobility from the uterus within the cul de sac. On intra-abdominal entry, filmy adhesions noted between bilateral anterior liver surfaces and the anterior abdominal wall. Prominent left lobe of the liver. Otherwise normal upper abdominal survey. Normal small and large bowel, omentum. NO ascites. Smooth 8-10cm cystic mass replacing the left ovary, adherent to the deep left pelvic sidewall and cul de sac. Tube dilated and torturous. Upon manipulation of the ovary, there was rupture and drainage of chocolate cyst fluid c/w endometriosis. Minimal drainage concerning for possible purulence so decision made to given antibiotics intra-op. Tissue thick and inflamed. Mass in close proximity to the left ureter, requiring ureterolysis down to the level of the uterine artery. Endosalpingiosis noted. Right ovary appeared surgically absent, only small portion of right fallopian tube still intact form prior surgeries. NO intra-abdominal or pelvic evidence of disease.   Frozen section c/w possible endometriosis, benign mass.  Estimated Blood Loss:  less than 50 mL      Total IV Fluids: see I&O flowsheet         Specimens: pelvic washings, bilateral tubes, left ovary         Complications:  None apparent; patient tolerated the procedure well.          Disposition: PACU - hemodynamically stable.  Procedure Details  The patient was seen in the Holding Room. The risks, benefits, complications, treatment options, and expected outcomes were discussed with the patient.  The patient concurred with the proposed plan, giving informed consent.  The site of surgery properly noted/marked. The patient was identified as Tiffany Ray and the procedure verified as a Robotic-assisted USO, possible hysterectomy, possible staging.  After induction of anesthesia, the patient was draped and prepped in the usual sterile manner. Patient was placed in supine position after anesthesia and draped and prepped in the usual sterile manner as follows: Her arms were tucked to her side with all appropriate precautions.  The shoulders were stabilized with padded shoulder blocks applied to the acromium processes.  The patient was placed in the semi-lithotomy position in [place].  The perineum and vagina were prepped with CholoraPrep. The patient was draped after the CholoraPrep had been allowed to dry for 3 minutes.  A Time Out was held and the above information confirmed.  The urethra was prepped with Betadine. Foley catheter was placed.  A sterile speculum was placed in the vagina.  The cervix was grasped with a single-tooth tenaculum. The cervix was dilated with Kennon Rounds dilators.  The ZUMI uterine manipulator with no KOH ring was placed without difficulty.  OG tube placement was confirmed and to suction.   Next, a 10 mm skin incision was made 1 cm below the subcostal margin in the midclavicular line.  The 5 mm Optiview port and scope was used for direct entry.  Opening pressure was under 10 mm CO2.  The abdomen was insufflated and the findings were noted as above.   At this point and all points  during the procedure, the patient's intra-abdominal pressure did not exceed 15 mmHg. Next, an 8 mm skin incision was made superior to the umbilicus and a right and left port were  placed about 8 cm lateral to the robot port on the right and left side. The 5 mm assist trocar was exchanged for a 10-12 mm port. All ports were placed under direct visualization.  The patient was placed in steep Trendelenburg.  The robot was docked in the normal manner.  The left peritoneum was opened parallel to the IP ligament to open the retroperitoneal space. The ureter was noted to be on the medial leaf of the broad ligament.  The peritoneum above the ureter was incised and stretched and the infundibulopelvic ligament was skeletonized, cauterized and cut.  Ureterolysis was then performed to drop the ureter laterally and inferiorly from the mass. The fallopian tube was then cauterized and transected just lateral to the fundus. Using a combination of sharp, blunt dissection and electrocautery, the ovary and mass were carefully dissected from the medial broad ligament and attachments to the posterior uterus, side wall and cul de sac. During manipulation of the ovary for this dissection, there was a small rupture site with drainage of chocolate appearing fluid c/w endometriosis. Once freed of all of its attachments, the left adnexa was placed in a 47mm endocatch bag and removed through the assist trocar to be sent for frozen section.  Attention was then turned to the right and the small remaining portion of the right fallopian tube was cauterized, transected, and handed off the field.  Pedicles were inspected and excellent hemostasis was achieved.  The pelvis was irrigated.   Once frozen section had returned, the procedure was completed.  Robotic instruments were removed under direct visulaization.  The robot was undocked. The fascia at the 10-12 mm port was closed with 0 Vicryl on a UR-5 needle.  The subcuticular tissue was closed with 4-0 Vicryl and the skin was closed with 4-0 Monocryl in a subcuticular manner.  Dermabond was applied.    The vagina was swabbed with minimal bleeding noted.   All sponge,  lap and needle counts were correct x  3. Foley catheter was removed.  The patient was transferred to the recovery room in stable condition.  Jeral Pinch, MD

## 2020-07-08 NOTE — Discharge Instructions (Signed)
Return to work: 3 days to 1 week.  Activity: 1. Be up and out of the bed during the day.  Take a nap if needed.  You may walk up steps but be careful and use the hand rail.  Stair climbing will tire you more than you think, you may need to stop part way and rest.   2. No lifting or straining for 2 weeks.  3. Do Not drive if you are taking narcotic pain medicine.  4. Shower daily.  Use soap and water on your incision and pat dry; don't rub.    Medications:  - Take ibuprofen and tylenol first line for pain control. Take these regularly (every 6 hours) to decrease the build up of pain.  - If necessary, for severe pain not relieved by ibuprofen, contact Dr Charisse March office and you will be prescribed percocet.  - While taking percocet you should take sennakot every night to reduce the likelihood of constipation. If this causes diarrhea, stop its use.  Diet: 1. Low sodium Heart Healthy Diet is recommended.  2. It is safe to use a laxative if you have difficulty moving your bowels.   Wound Care: 1. Keep clean and dry.  Shower daily.  Reasons to call the Doctor:   Fever - Oral temperature greater than 100.4 degrees Fahrenheit  Foul-smelling vaginal discharge  Difficulty urinating  Nausea and vomiting  Increased pain at the site of the incision that is unrelieved with pain medicine.  Difficulty breathing with or without chest pain  New calf pain especially if only on one side  Sudden, continuing increased vaginal bleeding with or without clots.   Follow-up: 1. See Jeral Pinch in 4 weeks.  Contacts: For questions or concerns you should contact:  Dr. Jeral Pinch at [phone] After hours and on week-ends call [phone] and ask to speak to the physician on call for Gynecologic Oncology   Unilateral Salpingo-Oophorectomy Unilateral salpingo-oophorectomy is the surgical removal of one fallopian tube and one ovary. The ovaries are the female reproductive organs  that produce eggs. The fallopian tubes allow eggs to move from the ovaries to the uterus. You may need this procedure if you have:  An infection in the fallopian tube and ovary.  Scar tissue (adhesions) in the fallopian tube and ovary.  A cyst or tumor on the ovary.  Your uterus removed.  Cancer of the fallopian tube or ovary. There are three techniques that can be used for this procedure:  Open. One large incision will be made in your abdomen.  Laparoscopic. A thin, lighted tube with a camera (laparoscope) will be used to perform the procedure. The laparoscope will allow your surgeon to make several small incisions in the abdomen instead of one large incision.  Robot-assisted. A computer will be used to control surgical instruments that are attached to robotic arms. A laparoscope may also be used with this technique. This procedure:  Will not stop you from becoming pregnant.  Will not cause menopause.  Will not cause problems with your menstrual periods.  Will not affect your sex drive. Tell a health care provider about:  Any allergies you have.  All medicines you are taking, including vitamins, herbs, eye drops, creams, and over-the-counter medicines.  Any problems you or family members have had with anesthetic medicines.  Any blood disorders you have.  Any surgeries you have had.  Any medical conditions you have.  Whether you are pregnant or may be pregnant. What are the risks? Generally,  this is a safe procedure. However, problems may occur, including:  Infection.  Bleeding.  Allergic reactions to medicines.  Damage to other structures or organs.  Blood clots in the legs or lungs. What happens before the procedure? Staying hydrated Follow instructions from your health care provider about hydration, which may include:  Up to 2 hours before the procedure - you may continue to drink clear liquids, such as water, clear fruit juice, black coffee, and plain  tea. Eating and drinking restrictions Follow instructions from your health care provider about eating and drinking, which may include:  8 hours before the procedure - stop eating heavy meals or foods such as meat, fried foods, or fatty foods.  6 hours before the procedure - stop eating light meals or foods, such as toast or cereal.  6 hours before the procedure - stop drinking milk or drinks that contain milk.  2 hours before the procedure - stop drinking clear liquids. Medicines  Ask your health care provider about: ? Changing or stopping your regular medicines. This is especially important if you are taking diabetes medicines or blood thinners. ? Taking over-the-counter medicines, vitamins, herbs, and supplements. ? Taking medicines such as aspirin and ibuprofen. These medicines can thin your blood. Do not take these medicines unless your health care provider tells you to take them.  You may be given antibiotic medicine to help prevent infection. General instructions  Do not smoke for at least 2 weeks before your procedure, or as told by your health care provider.  You may have an exam or testing.  You may have a blood or urine sample taken.  Ask your health care provider how your surgical site will be marked or identified.  Plan to have someone take you home from the hospital or clinic.  If you will be going home right after the procedure, plan to have someone with you for 24 hours. What happens during the procedure?  To reduce your risk of infection: ? Your health care team will wash or sanitize their hands. ? Hair may be removed from the surgical area. ? Your skin will be washed with soap.  An IV will be inserted into one of your veins.  You will be given one or both of the following: ? A medicine to help you relax (sedative). ? A medicine to make you fall asleep (general anesthetic).  A small, thin tube (catheter) will be inserted through your urethra and into your  bladder. The catheter will drain urine during the procedure.  Depending on the type of surgery you are having, your surgeon will do one of the following: ? Make one incision in your abdomen (open surgery). ? Make two small incisions in your abdomen (laparoscopic surgery). The laparoscope will be passed through one incision, and surgical instruments will be passed through the other. ? Make several small incisions in your abdomen (robot-assisted surgery). A laparoscope and other surgical instruments may be passed through the incisions.  Your fallopian tube and ovary will be cut away from the uterus and removed.  Your blood vessels will be clamped and tied to prevent too much bleeding.  The incisions in your abdomen will be closed with stitches (sutures) or staples.  A bandage (dressing) may be placed over your incisions. The procedure may vary among health care providers and hospitals. What happens after the procedure?  Your blood pressure, heart rate, breathing rate, and blood oxygen level will be monitored until the medicines you were given have worn  off.  You may continue to receive fluids and medicines through an IV.  You may continue to have a catheter draining your urine.  You may have to wear compression stockings. These stockings help to prevent blood clots and reduce swelling in your legs.  You will be given pain medicine as needed.  Do not drive for 24 hours if you received a sedative. Summary  Unilateral salpingo-oophorectomy is the surgical removal of one fallopian tube and one ovary.  There are three techniques that can be used for this procedure: open, laparoscopic, and robotic. Ask your health care provider which procedure will be used in your case.  This procedure will not stop you from becoming pregnant, or cause problems with your menstrual periods or sex drive. This information is not intended to replace advice given to you by your health care provider. Make sure  you discuss any questions you have with your health care provider. Document Revised: 06/15/2017 Document Reviewed: 10/12/2016 Elsevier Patient Education  [place] Instructions  Activity: Get plenty of rest for the remainder of the day. A responsible individual must stay with you for 24 hours following the procedure.  For the next 24 hours, DO NOT: -Drive a car -Paediatric nurse -Drink alcoholic beverages -Take any medication unless instructed by your physician -Make any legal decisions or sign important papers.  Meals: Start with liquid foods such as gelatin or soup. Progress to regular foods as tolerated. Avoid greasy, spicy, heavy foods. If nausea and/or vomiting occur, drink only clear liquids until the nausea and/or vomiting subsides. Call your physician if vomiting continues.  Special Instructions/Symptoms: Your throat may feel dry or sore from the anesthesia or the breathing tube placed in your throat during surgery. If this causes discomfort, gargle with warm salt water. The discomfort should disappear within 24 hours.  If you had a scopolamine patch placed behind your ear for the management of post- operative nausea and/or vomiting:  1. The medication in the patch is effective for 72 hours, after which it should be removed.  Wrap patch in a tissue and discard in the trash. Wash hands thoroughly with soap and water. 2. You may remove the patch earlier than 72 hours if you experience unpleasant side effects which may include dry mouth, dizziness or visual disturbances. 3. Avoid touching the patch. Wash your hands with soap and water after contact with the patch.   Remove patch by Sunday, December 26th.   Next dose of Tylenol after 12:45 PM today if needed.  No ibuprofen, Advil, Aleve, Motrin, or naproxen until after 12:45 pm today if needed.

## 2020-07-12 ENCOUNTER — Telehealth: Payer: Self-pay

## 2020-07-12 ENCOUNTER — Encounter (HOSPITAL_BASED_OUTPATIENT_CLINIC_OR_DEPARTMENT_OTHER): Payer: Self-pay | Admitting: Gynecologic Oncology

## 2020-07-12 LAB — CYTOLOGY - NON PAP

## 2020-07-12 NOTE — Telephone Encounter (Signed)
TC x 2 to patient this am to check post op status.  Message left to return call.

## 2020-07-13 LAB — SURGICAL PATHOLOGY

## 2020-07-15 ENCOUNTER — Encounter: Payer: Self-pay | Admitting: Gynecologic Oncology

## 2020-07-16 ENCOUNTER — Encounter: Payer: Self-pay | Admitting: Gynecologic Oncology

## 2020-07-27 ENCOUNTER — Telehealth: Payer: Self-pay | Admitting: *Deleted

## 2020-07-27 NOTE — Telephone Encounter (Signed)
Patient called and moved her appt time from 1:15pm to 3pm due to her work schedule

## 2020-07-29 NOTE — Progress Notes (Signed)
Gynecologic Oncology Return Clinic Visit  07/30/20  Reason for Visit: post-op follow-up  Treatment History: 12/23: Robotic LSO, left ureterolysis, right partial salpingectomy in the setting of complex adnexal mass, mildly elevated CA-125. Intra-op findings c/w endometriosis.  Interval History: Patient presents today for follow-up.  She is several weeks out from robotic surgery for a complex adnexal mass found to have endometrioma.  I had messaged with her about a week after her surgery given severe abdominal pain.  At that point she was having no bowel function, and I suggested aggressively working on her bowel function.  The most recent message that I received from her was that her pain had significantly improved.  Today, the patient notes having cramping-like pain in the pelvis that she is unsure whether it feels like menstrual cramping.  This cramping occurs every day and last most of the day.  On some days it is more mild during the day and she is able to go to work and then it worsens at night when she gets home.  She has tried Goody's, ibuprofen, oxycodone, and heat therapy.  2 tablets of oxycodone would take the edge off of the pain, but nothing has provided relief.  Today she notes having diarrhea that started yesterday and continues today.  This is her first bowel function since surgery.  She took Senokot for a week after surgery but then stopped because she did not feel that it was helping.  She notes her appetite has been so-so.  She has had some intermittent nausea, denies any emesis.  She denies any urinary symptoms.  Since surgery, she has some changes in temperature control.  Initially she felt very warm after surgery for the first week or 2.  Last weekend, she had chills and was felt like she had the flu.  Now she feels that parts of her body, such as her feet are always cold.  Past Medical/Surgical History: Past Medical History:  Diagnosis Date  . Abnormal uterine bleeding (AUB)   .  Adnexal mass   . Anemia   . HIV infection ([place])   . Pneumonia   . Pre-diabetes   . Prediabetes     Past Surgical History:  Procedure Laterality Date  . ECTOPIC PREGNANCY SURGERY    . ROBOTIC ASSISTED SALPINGO OOPHERECTOMY Bilateral 07/08/2020   Procedure: XI ROBOTIC ASSISTED LEFT SALPINGO OOPHORECTOMY, RIGHT SALPINGECTOMY;  Surgeon: Lafonda Mosses, MD;  Location: [facility];  Service: Gynecology;  Laterality: Bilateral;  . TUBAL LIGATION      Family History  Problem Relation Age of Onset  . Lung cancer Mother   . Hypertension Brother   . Lung cancer Father   . Colon cancer Other   . Hypertension Maternal Grandmother   . Breast cancer Neg Hx   . Uterine cancer Neg Hx   . Ovarian cancer Neg Hx     Social History   Socioeconomic History  . Marital status: Single    Spouse name: Not on file  . Number of children: 4  . Years of education: 51  . Highest education level: Not on file  Occupational History  . Not on file  Tobacco Use  . Smoking status: Current Every Day Smoker    Packs/day: 0.33    Types: Cigarettes  . Smokeless tobacco: Never Used  Vaping Use  . Vaping Use: Never used  Substance and Sexual Activity  . Alcohol use: Never  . Drug use: Never  . Sexual activity: Not on file  Other Topics Concern  . Not on file  Social History Narrative  . Not on file   Social Determinants of Health   Financial Resource Strain: Not on file  Food Insecurity: Not on file  Transportation Needs: Not on file  Physical Activity: Not on file  Stress: Not on file  Social Connections: Not on file    Current Medications:  Current Outpatient Medications:  .  Aspirin-Acetaminophen-Caffeine (GOODYS EXTRA STRENGTH PO), Take 1 Package by mouth daily as needed (pain)., Disp: , Rfl:  .  Darunavir-Cobicisctat-Emtricitabine-Tenofovir Alafenamide (SYMTUZA) 800-150-200-10 MG TABS, Take 1 tablet by mouth daily with breakfast., Disp: 30 tablet, Rfl: 5 .   ibuprofen (ADVIL) 600 MG tablet, Take 1 tablet (600 mg total) by mouth every 6 (six) hours as needed for moderate pain. For AFTER surgery only, Disp: 30 tablet, Rfl: 0 .  polyethylene glycol (MIRALAX) 17 g packet, Take 17 g by mouth daily., Disp: 14 each, Rfl: 0 .  oxyCODONE (OXY IR/ROXICODONE) 5 MG immediate release tablet, Take 1 tablet (5 mg total) by mouth every 4 (four) hours as needed for severe pain. For AFTER surgery only, do not take and drive (Patient not taking: Reported on 07/30/2020), Disp: 15 tablet, Rfl: 0 .  senna-docusate (SENOKOT-S) 8.6-50 MG tablet, Take 2 tablets by mouth at bedtime. For AFTER surgery, do not take if having diarrhea (Patient not taking: Reported on 07/30/2020), Disp: 30 tablet, Rfl: 0  Review of Systems: Pertinent positives as per HPI.  Patient endorses abdominal pain, diarrhea. Denies fevers, fatigue, unexplained weight changes. Denies hearing loss, neck lumps or masses, mouth sores, ringing in ears or voice changes. Denies cough or wheezing.  Denies shortness of breath. Denies chest pain or palpitations. Denies leg swelling. Denies abdominal distention, blood in stools, vomiting, or early satiety. Denies pain with intercourse, dysuria, frequency, hematuria or incontinence. Denies hot flashes, pelvic pain, vaginal bleeding or vaginal discharge.   Denies joint pain, back pain or muscle pain/cramps. Denies itching, rash, or wounds. Denies dizziness, headaches, numbness or seizures. Denies swollen lymph nodes or glands, denies easy bruising or bleeding. Denies anxiety, depression, confusion, or decreased concentration.  Physical Exam: BP (!) 133/92 (BP Location: Left Arm, Patient Position: Sitting)   Pulse 94   Temp (!) 97.3 F (36.3 C) (Tympanic)   Resp 18   Wt 261 lb (118.4 kg)   SpO2 100%   BMI 38.54 kg/m  General: Alert, oriented, no acute distress. HEENT: Normocephalic, atraumatic, sclera anicteric. Chest: Unlabored breathing on room  air. Abdomen: Obese, soft, mild tenderness to deep palpation suprapubically.  Normoactive bowel sounds.  No masses or hepatosplenomegaly appreciated. Well-healing laparoscopic incisions. Extremities: Grossly normal range of motion.  Warm, well perfused.  No edema bilaterally.  Laboratory & Radiologic Studies: A. OVARY AND FALLOPIAN TUBE, LEFT, SALPINGO-OOPHORECTOMY:  - Benign endometriotic cyst with bone.   B. FALLOPIAN TUBE, RIGHT, SALPINGECTOMY:  - Benign fallopian tube tissue.  Assessment & Plan: Tiffany Ray is a 45 y.o. woman s/p unilateral oophorectomy, BS for complex adnexal mass with final pathology confirming intra-op findings of endometriosis.  The patient is meeting most postoperative milestones although has had several issues since surgery.  The first is lower abdominal pain/cramping.  Given almost 3 weeks of no bowel movements, I suspect that this pain is related to significant constipation.  More recently, the patient has not been taking any medications to help with this.  She began having diarrhea yesterday, and I suspect this will continue for at least several days.  I recommended that she start some medication to help clear out her colon.  I sent a prescription in for MiraLAX.  We discussed that diarrhea over the next couple of days would be normal but that ultimately her stool should start to form.  Given her suprapubic tenderness on exam, I am going to send a urine to rule out a urinary tract infection.  My suspicion for this is quite low given no urinary symptoms.  I stressed the importance of improving her bowel function over the next few days.  We will have a phone visit next week.  I am tentatively scheduling her for a CT scan sometime after the middle of next week to rule out a postoperative etiology for her abdominal cramping.  My suspicion is that once her bowels are moving more normally, this cramping and pain will improve.  We also discussed her issues with temperature  regulation.  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  She is not describing what sounds characteristic for hot flashes, but I suspect that her temperature dysregulation may be related to hormonal changes.  I discussed the possibility of starting her on hormone replacement therapy at this time, but the patient's preference is to wait for several weeks and see how she is feeling.  We discussed nonhormonal treatment for her symptoms as well as low dose HRT.  28 minutes of total time was spent for this patient encounter, including preparation, face-to-face counseling with the patient and coordination of care, and documentation of the encounter.  Jeral Pinch, MD  Division of Gynecologic Oncology  Department of Obstetrics and Gynecology  [facility]

## 2020-07-30 ENCOUNTER — Other Ambulatory Visit (HOSPITAL_COMMUNITY): Payer: Self-pay | Admitting: Gynecologic Oncology

## 2020-07-30 ENCOUNTER — Encounter: Payer: Self-pay | Admitting: Gynecologic Oncology

## 2020-07-30 ENCOUNTER — Inpatient Hospital Stay: Payer: Medicaid Other | Attending: Gynecologic Oncology | Admitting: Gynecologic Oncology

## 2020-07-30 ENCOUNTER — Other Ambulatory Visit: Payer: Self-pay

## 2020-07-30 VITALS — BP 133/92 | HR 94 | Temp 97.3°F | Resp 18 | Wt 261.0 lb

## 2020-07-30 DIAGNOSIS — R1900 Intra-abdominal and pelvic swelling, mass and lump, unspecified site: Secondary | ICD-10-CM

## 2020-07-30 DIAGNOSIS — K5909 Other constipation: Secondary | ICD-10-CM | POA: Insufficient documentation

## 2020-07-30 DIAGNOSIS — Z90722 Acquired absence of ovaries, bilateral: Secondary | ICD-10-CM

## 2020-07-30 DIAGNOSIS — G8918 Other acute postprocedural pain: Secondary | ICD-10-CM

## 2020-07-30 DIAGNOSIS — N809 Endometriosis, unspecified: Secondary | ICD-10-CM

## 2020-07-30 MED ORDER — POLYETHYLENE GLYCOL 3350 17 G PO PACK: 17.0000 g | PACK | Freq: Every day | ORAL | 0 refills | Status: DC

## 2020-07-30 MED FILL — POLYETHYLENE GLYCOL 3350 PO: 17 | 30 days supply | Qty: 510 | Fill #0

## 2020-07-30 NOTE — Patient Instructions (Signed)
Your incisions look great!   I sent Miralax to your pharmacy. Please drink plenty of water, increase your fiber intake and use Miralax once a day. I would also continue using a stool softener. I will call you next Tuesday. My hope is that once your bowels are moving normally, your pain will be much better.  I am also testing your urine to make sure there is no infection.  If your symptoms don't improve with getting your bowels moving, we will get a CT scan next week to make sure no issue related to surgery is causing your symptoms.

## 2020-08-03 ENCOUNTER — Telehealth: Payer: Self-pay | Admitting: Gynecologic Oncology

## 2020-08-03 ENCOUNTER — Inpatient Hospital Stay: Payer: Medicaid Other | Admitting: Gynecologic Oncology

## 2020-08-03 ENCOUNTER — Encounter: Payer: Self-pay | Admitting: Gynecologic Oncology

## 2020-08-03 NOTE — Telephone Encounter (Signed)
Called the patient twice at our scheduled phone visit time.  Both times rang once and went to voicemail.  Left voicemail with callback requested.  Her insurance denied CT scan for an adequate need.  We had discussed that if her pain did not significantly improve with her bowels moving better, that we would pursue additional imaging.  If she continues to have pain, we will plan to get blood work and an x-ray to start.  Jeral Pinch MD Gynecologic Oncology

## 2020-08-04 ENCOUNTER — Telehealth: Payer: Self-pay | Admitting: *Deleted

## 2020-08-04 ENCOUNTER — Ambulatory Visit (HOSPITAL_BASED_OUTPATIENT_CLINIC_OR_DEPARTMENT_OTHER): Payer: Medicaid Other

## 2020-08-04 NOTE — Telephone Encounter (Signed)
Left the patient a message to call the office back; need to see if the patient received Dr Charisse March message

## 2020-08-05 ENCOUNTER — Telehealth: Payer: Self-pay | Admitting: *Deleted

## 2020-08-05 NOTE — Progress Notes (Signed)
This encounter was created in error - please disregard.

## 2020-08-05 NOTE — Telephone Encounter (Signed)
Per Dr Berline Lopes called the patient to make sure she received her my chart message. Patient stated "I did get the message and things are coming long and bowels are better." Message given to Dr Berline Lopes

## 2020-08-11 ENCOUNTER — Ambulatory Visit (HOSPITAL_COMMUNITY): Payer: Medicaid Other

## 2020-09-15 ENCOUNTER — Other Ambulatory Visit: Payer: Self-pay | Admitting: Family

## 2020-09-15 ENCOUNTER — Encounter: Payer: Self-pay | Admitting: Family

## 2020-09-15 ENCOUNTER — Other Ambulatory Visit: Payer: Self-pay

## 2020-09-15 ENCOUNTER — Ambulatory Visit (INDEPENDENT_AMBULATORY_CARE_PROVIDER_SITE_OTHER): Payer: Medicaid Other | Admitting: Family

## 2020-09-15 VITALS — BP 156/137 | HR 100 | Temp 98.2°F | Ht 69.0 in | Wt 266.0 lb

## 2020-09-15 DIAGNOSIS — B20 Human immunodeficiency virus [HIV] disease: Secondary | ICD-10-CM

## 2020-09-15 DIAGNOSIS — R200 Anesthesia of skin: Secondary | ICD-10-CM | POA: Diagnosis not present

## 2020-09-15 DIAGNOSIS — Z79899 Other long term (current) drug therapy: Secondary | ICD-10-CM

## 2020-09-15 DIAGNOSIS — Z0000 Encounter for general adult medical examination without abnormal findings: Secondary | ICD-10-CM | POA: Diagnosis not present

## 2020-09-15 DIAGNOSIS — R202 Paresthesia of skin: Secondary | ICD-10-CM

## 2020-09-15 DIAGNOSIS — Z113 Encounter for screening for infections with a predominantly sexual mode of transmission: Secondary | ICD-10-CM

## 2020-09-15 DIAGNOSIS — R7303 Prediabetes: Secondary | ICD-10-CM

## 2020-09-15 MED ORDER — SYMTUZA 800-150-200-10 MG PO TABS: 1.0000 | ORAL_TABLET | Freq: Every day | ORAL | 5 refills | Status: DC

## 2020-09-15 NOTE — Assessment & Plan Note (Signed)
Tiffany Ray continues to have well-controlled HIV disease with good adherence and tolerance to her ART regimen of Symtuza.  No signs/symptoms of opportunistic infection.  Numbness/tingling unlikely related to HIV although may be a contributing factor.  We reviewed previous lab work and discussed plan of care.  Check blood work today.  Continue current dose of Symtuza.  Plan for follow-up in 4 months or sooner if needed.

## 2020-09-15 NOTE — Assessment & Plan Note (Signed)
Tiffany Ray had a hemoglobin A1c of 6.0 indicating prediabetes.  With new onset tingling and coldness sensation as well as increased hunger at times will recheck A1c with concern for possible progression to diabetes.  There is likely significant underlying insulin resistance.  We discussed dietary changes to aid in decreasing insulin resistance.  Additional treatment pending A1c results.

## 2020-09-15 NOTE — Progress Notes (Signed)
Subjective:    Patient ID: Tiffany Ray, female    DOB: 06-11-1976, 45 y.o.   MRN: [id number]  Chief Complaint  Patient presents with  . Follow-up    Declined condoms      HPI:  Tiffany Ray is a 45 y.o. female with HIV disease last senn on 02/25/20 with well controlled virus and good adherence and tolerance to her ART regimen of Symtuza.  Viral load at the time was undetectable with CD4 count of 520.  In the interim she underwent robotic assisted bilateral oophorectomy in the setting of complex adnexal mass with intraoperative findings consistent with endometriosis.  Here today for routine follow-up.  Tiffany Ray continues to take her Symtuza daily as prescribed with no adverse side effects or missed doses since her last office visit.  She has several concerns today including new onset cold sensation and numbness and tingling in the tips of her fingers as well as discoloration of her great toenails. Denies fevers, chills, night sweats, headaches, changes in vision, neck pain/stiffness, nausea, diarrhea, vomiting, lesions or rashes.  Tiffany Ray has no problems obtaining medication from the pharmacy.  Denies being down, depressed, or hopeless recently.  No recreational or illicit drug use or alcohol consumption.  Smokes approximately one third a pack of cigarettes per day on average. Due for routine dental care with referral requested.  Declines condoms.  Declines Covid vaccine   No Known Allergies    Outpatient Medications Prior to Visit  Medication Sig Dispense Refill  . Darunavir-Cobicisctat-Emtricitabine-Tenofovir Alafenamide (SYMTUZA) 800-150-200-10 MG TABS Take 1 tablet by mouth daily with breakfast. 30 tablet 5  . Aspirin-Acetaminophen-Caffeine (GOODYS EXTRA STRENGTH PO) Take 1 Package by mouth daily as needed (pain). (Patient not taking: Reported on 09/15/2020)    . ibuprofen (ADVIL) 600 MG tablet Take 1 tablet (600 mg total) by mouth every 6 (six) hours as needed for  moderate pain. For AFTER surgery only (Patient not taking: Reported on 09/15/2020) 30 tablet 0  . oxyCODONE (OXY IR/ROXICODONE) 5 MG immediate release tablet Take 1 tablet (5 mg total) by mouth every 4 (four) hours as needed for severe pain. For AFTER surgery only, do not take and drive (Patient not taking: No sig reported) 15 tablet 0  . polyethylene glycol (MIRALAX) 17 g packet Take 17 g by mouth daily. (Patient not taking: Reported on 09/15/2020) 14 each 0  . senna-docusate (SENOKOT-S) 8.6-50 MG tablet Take 2 tablets by mouth at bedtime. For AFTER surgery, do not take if having diarrhea (Patient not taking: No sig reported) 30 tablet 0   No facility-administered medications prior to visit.     Past Medical History:  Diagnosis Date  . Abnormal uterine bleeding (AUB)   . Adnexal mass   . Anemia   . HIV infection ([place])   . Pneumonia   . Pre-diabetes   . Prediabetes      Past Surgical History:  Procedure Laterality Date  . ECTOPIC PREGNANCY SURGERY    . ROBOTIC ASSISTED SALPINGO OOPHERECTOMY Bilateral 07/08/2020   Procedure: XI ROBOTIC ASSISTED LEFT SALPINGO OOPHORECTOMY, RIGHT SALPINGECTOMY;  Surgeon: Lafonda Mosses, MD;  Location: [facility];  Service: Gynecology;  Laterality: Bilateral;  . TUBAL LIGATION         Review of Systems  Constitutional: Negative for chills, diaphoresis, fatigue and fever.  Respiratory: Negative for cough, chest tightness, shortness of breath and wheezing.   Cardiovascular: Negative for chest pain.  Gastrointestinal: Negative for abdominal pain, diarrhea, nausea  and vomiting.  Neurological: Positive for numbness.      Objective:    BP (!) 156/137   Pulse 100   Temp 98.2 F (36.8 C) (Oral)   Ht 5\' 9"  (1.753 m)   Wt 266 lb (120.7 kg)   SpO2 100%   BMI 39.28 kg/m  Nursing note and vital signs reviewed.  Physical Exam Constitutional:      General: She is not in acute distress.    Appearance: She is well-developed and  well-nourished.  Cardiovascular:     Rate and Rhythm: Normal rate and regular rhythm.     Pulses: Intact distal pulses.     Heart sounds: Normal heart sounds.  Pulmonary:     Effort: Pulmonary effort is normal.     Breath sounds: Normal breath sounds.  Skin:    General: Skin is warm and dry.  Neurological:     Mental Status: She is alert and oriented to person, place, and time.  Psychiatric:        Mood and Affect: Mood and affect normal.        Behavior: Behavior normal.        Thought Content: Thought content normal.        Judgment: Judgment normal.      Depression screen [facility] 2/9 09/15/2020 05/13/2019 11/07/2018 09/13/2018  Decreased Interest 0 0 0 0  Down, Depressed, Hopeless 0 0 0 0  PHQ - 2 Score 0 0 0 0       Assessment & Plan:    Patient Active Problem List   Diagnosis Date Noted  . Numbness and tingling 09/15/2020  . Post-op pain 07/30/2020  . Other constipation 07/30/2020  . Endometriosis   . Pelvic mass in female 02/23/2020  . Pelvic pain in female 02/23/2020  . Menorrhagia with irregular cycle 11/04/2019  . Screening for cervical cancer 11/04/2019  . Prediabetes 10/06/2019  . HIV disease ([place]) 09/13/2018  . Healthcare maintenance 09/13/2018     Problem List Items Addressed This Visit      Other   HIV disease ([place]) - Primary    Tiffany Ray continues to have well-controlled HIV disease with good adherence and tolerance to her ART regimen of Symtuza.  No signs/symptoms of opportunistic infection.  Numbness/tingling unlikely related to HIV although may be a contributing factor.  We reviewed previous lab work and discussed plan of care.  Check blood work today.  Continue current dose of Symtuza.  Plan for follow-up in 4 months or sooner if needed.      Relevant Medications   Darunavir-Cobicisctat-Emtricitabine-Tenofovir Alafenamide (SYMTUZA) 800-150-200-10 MG TABS   Other Relevant Orders   COMPLETE METABOLIC PANEL WITH GFR   HIV-1 RNA quant-no reflex-bld    T-helper cell (CD4)- ([facility] clinic only)   Healthcare maintenance     Discussed importance of safe sexual practice to reduce risk of STI.  Condoms declined.  Due for routine dental care with referral placed to [facility] clinic.  Declines vaccines today.      Prediabetes    Tiffany Ray had a hemoglobin A1c of 6.0 indicating prediabetes.  With new onset tingling and coldness sensation as well as increased hunger at times will recheck A1c with concern for possible progression to diabetes.  There is likely significant underlying insulin resistance.  We discussed dietary changes to aid in decreasing insulin resistance.  Additional treatment pending A1c results.      Relevant Orders   HgB A1c   Numbness and tingling  Other Visit Diagnoses    Screening for STDs (sexually transmitted diseases)       Relevant Orders   RPR   Pharmacologic therapy       Relevant Orders   Lipid panel       I am having Vidal Schwalbe. Walski maintain her Aspirin-Acetaminophen-Caffeine (GOODYS EXTRA STRENGTH PO), senna-docusate, oxyCODONE, ibuprofen, polyethylene glycol, and Symtuza.   Meds ordered this encounter  Medications  . Darunavir-Cobicisctat-Emtricitabine-Tenofovir Alafenamide (SYMTUZA) 800-150-200-10 MG TABS    Sig: Take 1 tablet by mouth daily with breakfast.    Dispense:  30 tablet    Refill:  5    Order Specific Question:   Supervising Provider    Answer:   Carlyle Basques [[id number]]     Follow-up: Return in about 4 months (around 01/15/2021), or if symptoms worsen or fail to improve.   Terri Piedra, MSN, FNP-C Nurse Practitioner [facility] for Infectious Disease [place] number: [phone]

## 2020-09-15 NOTE — Patient Instructions (Addendum)
Nice to see you.  We will check your lab work today.  Continue to take your Symtuza daily as prescribed.  Refills have been sent to the pharmacy.   Work on decreasing carbohydrates.   Plan for follow up in 4 months or sooner if needed with lab work on the same day.  Have a great day!

## 2020-09-15 NOTE — Assessment & Plan Note (Signed)
   Discussed importance of safe sexual practice to reduce risk of STI.  Condoms declined.  Due for routine dental care with referral placed to [facility] clinic.  Declines vaccines today.

## 2020-09-16 LAB — T-HELPER CELL (CD4) - (RCID CLINIC ONLY)
CD4 % Helper T Cell: 41 % (ref 33–65)
CD4 T Cell Abs: 699 /uL (ref 400–1790)

## 2020-09-17 LAB — COMPLETE METABOLIC PANEL WITH GFR
AG Ratio: 1.2 (calc) (ref 1.0–2.5)
ALT: 15 U/L (ref 6–29)
AST: 16 U/L (ref 10–35)
Albumin: 4.1 g/dL (ref 3.6–5.1)
Alkaline phosphatase (APISO): 65 U/L (ref 31–125)
BUN: 11 mg/dL (ref 7–25)
CO2: 28 mmol/L (ref 20–32)
Calcium: 9.8 mg/dL (ref 8.6–10.2)
Chloride: 104 mmol/L (ref 98–110)
Creat: 0.94 mg/dL (ref 0.50–1.10)
GFR, Est African American: 85 mL/min/{1.73_m2} (ref >=60)
GFR, Est Non African American: 73 mL/min/{1.73_m2} (ref >=60)
Globulin: 3.4 g/dL (calc) (ref 1.9–3.7)
Glucose, Bld: 99 mg/dL (ref 65–99)
Potassium: 4.3 mmol/L (ref 3.5–5.3)
Sodium: 140 mmol/L (ref 135–146)
Total Bilirubin: 0.2 mg/dL (ref 0.2–1.2)
Total Protein: 7.5 g/dL (ref 6.1–8.1)

## 2020-09-17 LAB — HIV-1 RNA QUANT-NO REFLEX-BLD
HIV 1 RNA Quant: <20 Copies/mL — ABNORMAL HIGH
HIV-1 RNA Quant, Log: <1.3 Log cps/mL — ABNORMAL HIGH

## 2020-09-17 LAB — LIPID PANEL
Cholesterol: 264 mg/dL — ABNORMAL HIGH (ref <200)
HDL: 64 mg/dL (ref >=50)
LDL Cholesterol (Calc): 171 mg/dL (calc) — ABNORMAL HIGH
Non-HDL Cholesterol (Calc): 200 mg/dL (calc) — ABNORMAL HIGH (ref <130)
Total CHOL/HDL Ratio: 4.1 (calc) (ref <5.0)
Triglycerides: 144 mg/dL (ref <150)

## 2020-09-17 LAB — HEMOGLOBIN A1C
Hgb A1c MFr Bld: 6.1 % of total Hgb — ABNORMAL HIGH (ref <5.7)
Mean Plasma Glucose: 128 mg/dL
eAG (mmol/L): 7.1 mmol/L

## 2020-09-17 LAB — RPR: RPR Ser Ql: NONREACTIVE

## 2020-09-21 MED FILL — SYMTUZA 800-150-200-10 MG T: 800-150-200 | 30 days supply | Qty: 30 | Fill #0

## 2020-10-15 ENCOUNTER — Other Ambulatory Visit (HOSPITAL_COMMUNITY): Payer: Self-pay

## 2020-10-22 ENCOUNTER — Other Ambulatory Visit (HOSPITAL_COMMUNITY): Payer: Self-pay

## 2020-11-04 ENCOUNTER — Encounter: Payer: Self-pay | Admitting: Gynecologic Oncology

## 2020-11-05 ENCOUNTER — Other Ambulatory Visit (HOSPITAL_COMMUNITY): Payer: Self-pay

## 2020-11-08 ENCOUNTER — Other Ambulatory Visit (HOSPITAL_COMMUNITY): Payer: Self-pay

## 2020-11-08 MED FILL — Darunavir-Cobic-Emtricitab-Tenofov AF Tab 800-150-200-10 MG: ORAL | 30 days supply | Qty: 30 | Fill #1 | Status: CN

## 2020-11-08 MED FILL — Darunavir-Cobic-Emtricitab-Tenofov AF Tab 800-150-200-10 MG: ORAL | 30 days supply | Qty: 30 | Fill #0 | Status: AC

## 2020-12-03 ENCOUNTER — Other Ambulatory Visit (HOSPITAL_COMMUNITY): Payer: Self-pay

## 2020-12-08 ENCOUNTER — Other Ambulatory Visit (HOSPITAL_COMMUNITY): Payer: Self-pay

## 2020-12-15 ENCOUNTER — Other Ambulatory Visit (HOSPITAL_COMMUNITY): Payer: Self-pay

## 2020-12-21 ENCOUNTER — Other Ambulatory Visit (HOSPITAL_COMMUNITY): Payer: Self-pay

## 2020-12-21 MED FILL — Darunavir-Cobic-Emtricitab-Tenofov AF Tab 800-150-200-10 MG: ORAL | 30 days supply | Qty: 30 | Fill #1 | Status: AC

## 2021-01-18 ENCOUNTER — Other Ambulatory Visit (HOSPITAL_COMMUNITY): Payer: Self-pay

## 2021-01-24 ENCOUNTER — Ambulatory Visit: Payer: Medicaid Other | Admitting: Family

## 2021-01-28 ENCOUNTER — Other Ambulatory Visit (HOSPITAL_COMMUNITY): Payer: Self-pay

## 2021-01-28 MED FILL — Darunavir-Cobic-Emtricitab-Tenofov AF Tab 800-150-200-10 MG: ORAL | 30 days supply | Qty: 30 | Fill #2 | Status: AC

## 2021-02-01 ENCOUNTER — Other Ambulatory Visit: Payer: Self-pay

## 2021-02-01 ENCOUNTER — Ambulatory Visit: Payer: Medicaid Other | Admitting: Family

## 2021-02-21 ENCOUNTER — Ambulatory Visit: Payer: Medicaid Other | Admitting: Family

## 2021-02-22 IMAGING — MG DIGITAL SCREENING BILAT W/ TOMO W/ CAD
8 of 16 series · 8 of 40 positions shown · non-contrast
Comparison: Previous exam(s).

CLINICAL DATA: Screening.

EXAM:
DIGITAL SCREENING BILATERAL MAMMOGRAM WITH TOMO AND CAD

[L CC synth-2D (1 of 3)]
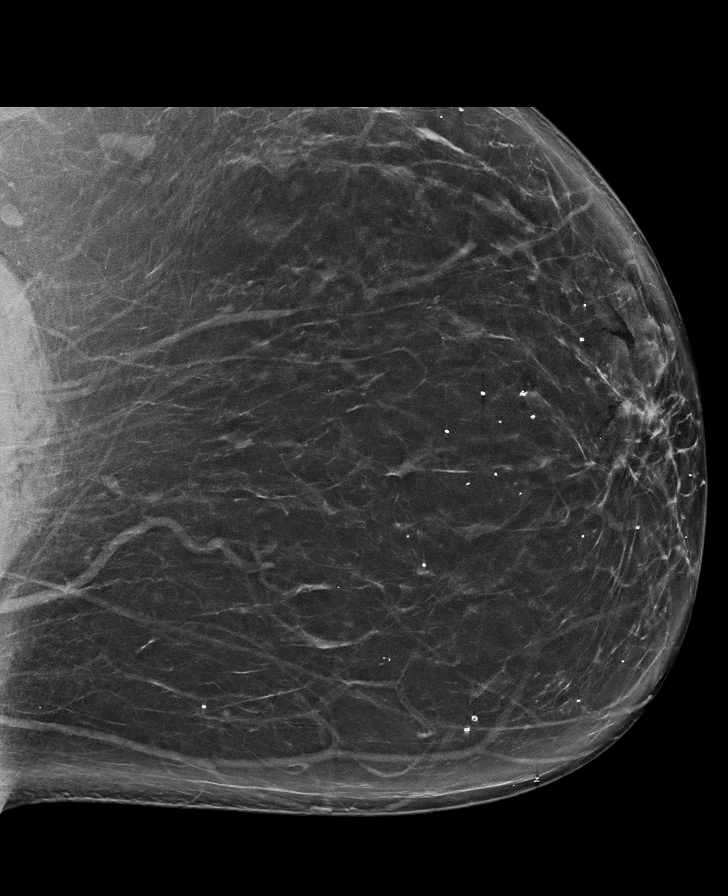

[R MLO synth-2D (1 of 2)]
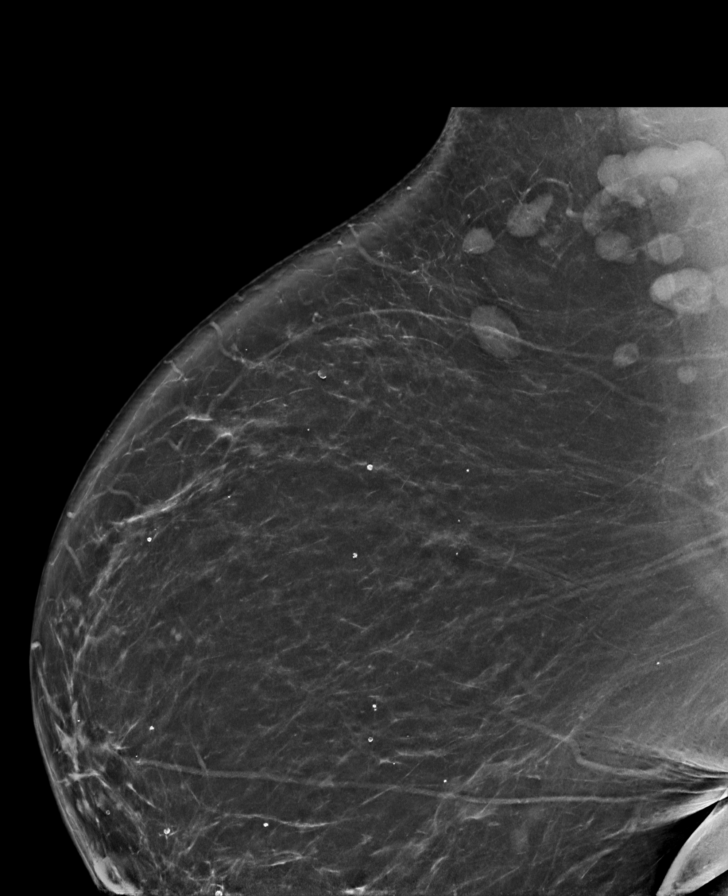

[R MLO synth-2D (2 of 2)]
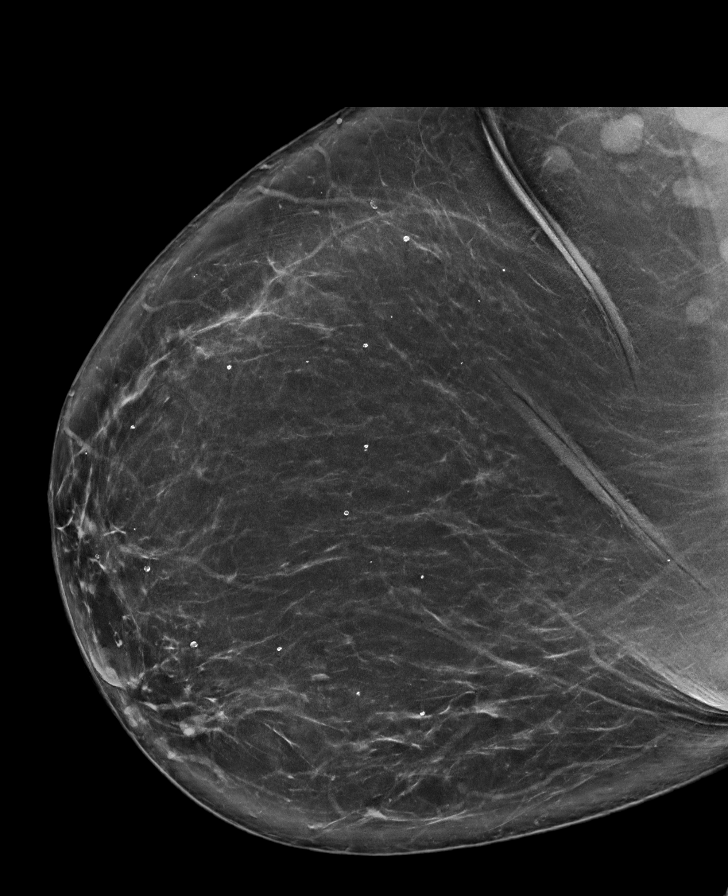

[R CC synth-2D]
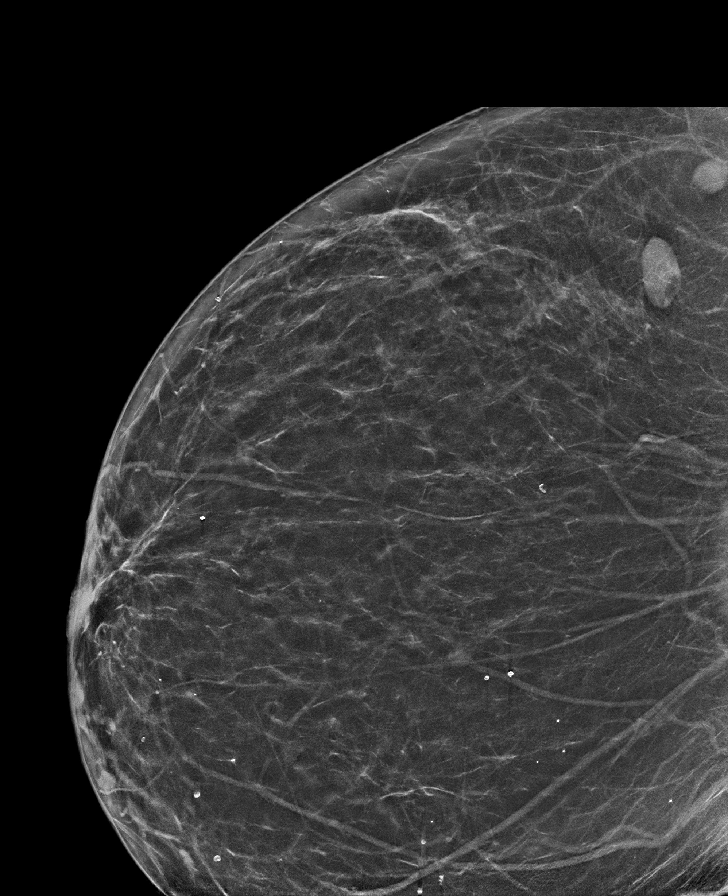

[L MLO synth-2D]
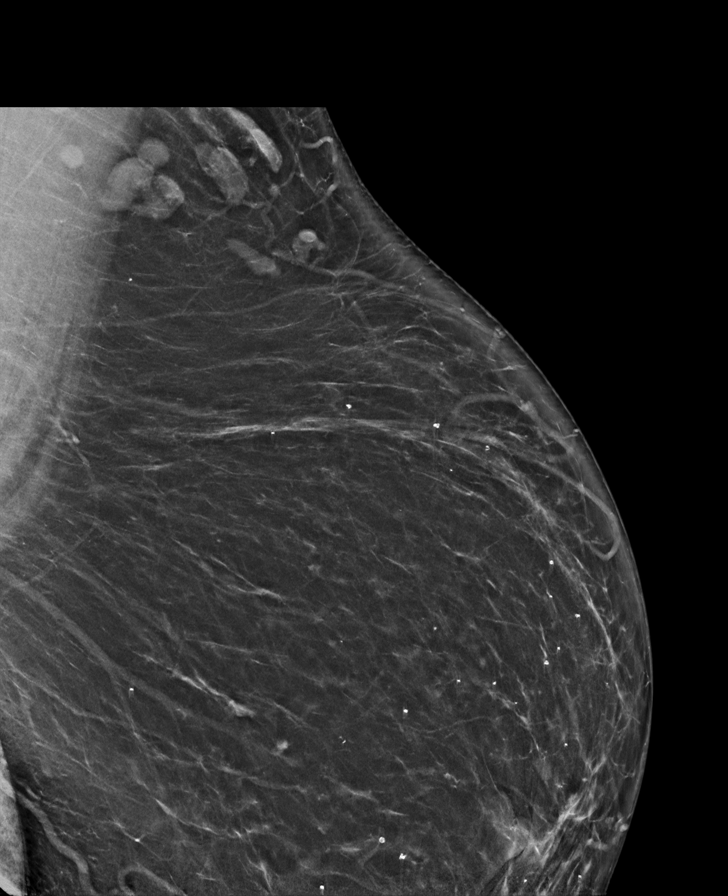

[L CC synth-2D (2 of 3)]
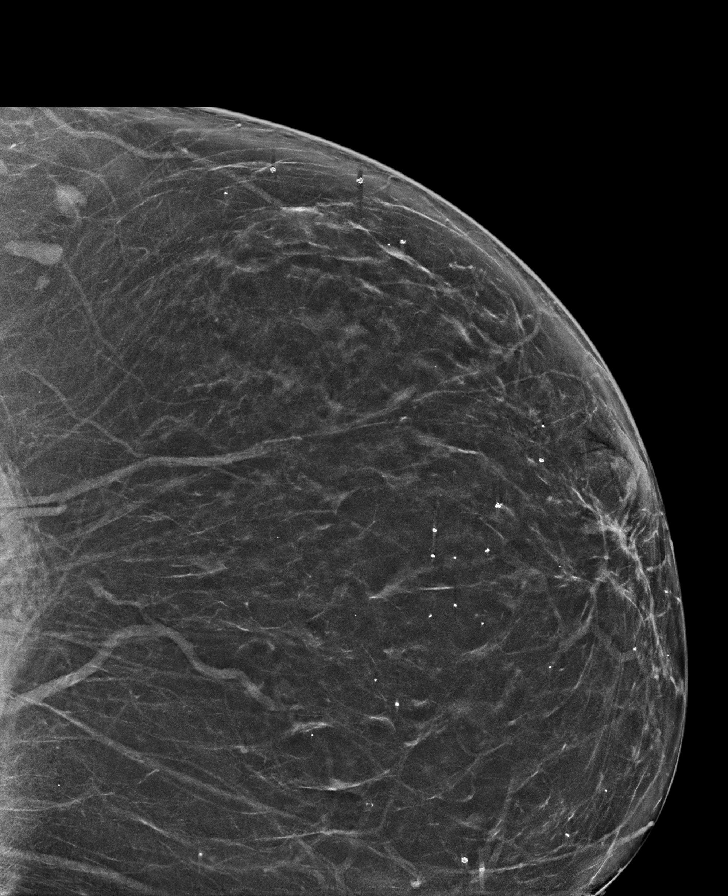

[L CC synth-2D (3 of 3)]
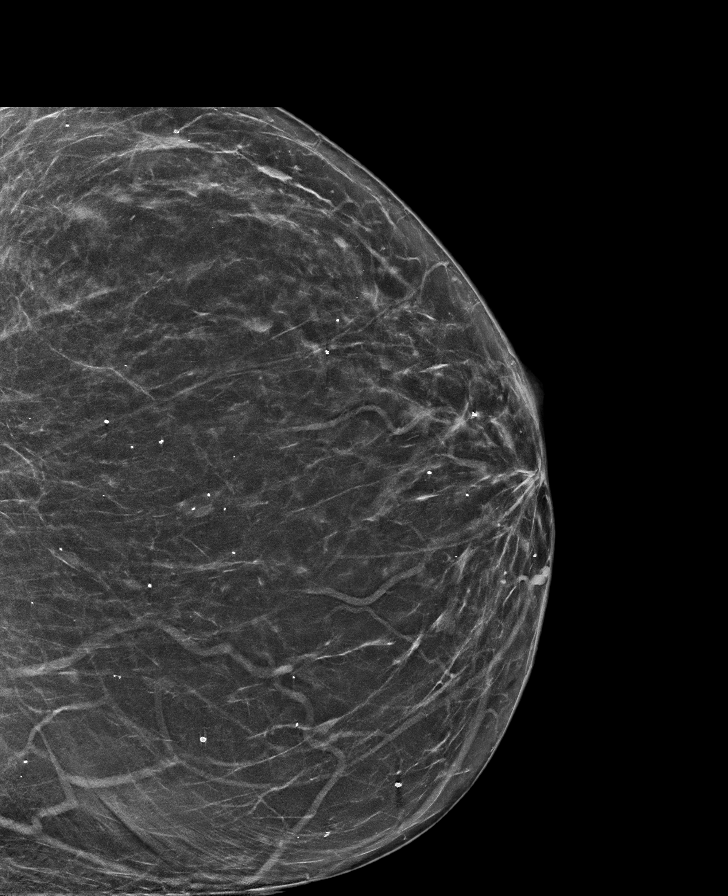

[R CC tomo · tomo slice 43/84.0]
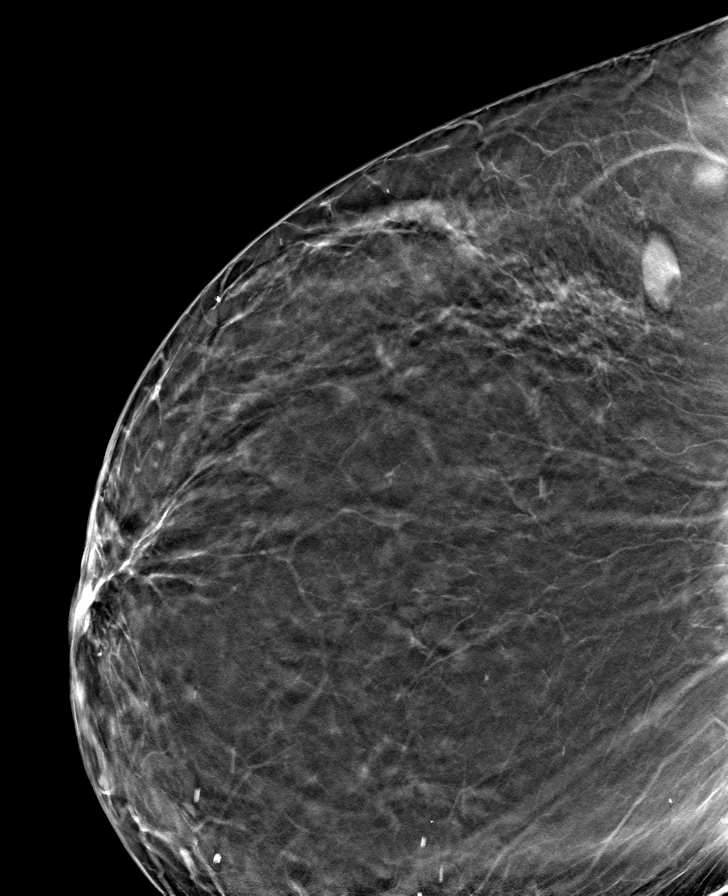

[8 of 40 positions shown; findings below may reference images not displayed]

ACR Breast Density Category b: There are scattered areas of
fibroglandular density.
FINDINGS: There are no findings suspicious for malignancy. Images were
processed with CAD.
IMPRESSION: No mammographic evidence of malignancy. A result letter of this
screening mammogram will be mailed directly to the patient.

RECOMMENDATION:
Screening mammogram in one year. (Code:[id number]-775)

BI-RADS CATEGORY  1: Negative.

## 2021-02-24 ENCOUNTER — Other Ambulatory Visit (HOSPITAL_COMMUNITY): Payer: Self-pay

## 2021-02-24 MED FILL — Darunavir-Cobic-Emtricitab-Tenofov AF Tab 800-150-200-10 MG: ORAL | 30 days supply | Qty: 30 | Fill #3 | Status: AC

## 2021-02-28 ENCOUNTER — Other Ambulatory Visit (HOSPITAL_COMMUNITY): Payer: Self-pay

## 2021-03-04 ENCOUNTER — Ambulatory Visit: Payer: Medicaid Other | Admitting: Family

## 2021-03-23 ENCOUNTER — Ambulatory Visit: Payer: Medicaid Other | Admitting: Family

## 2021-03-23 ENCOUNTER — Other Ambulatory Visit (HOSPITAL_COMMUNITY): Payer: Self-pay

## 2021-03-25 ENCOUNTER — Other Ambulatory Visit (HOSPITAL_COMMUNITY): Payer: Self-pay

## 2021-03-28 ENCOUNTER — Other Ambulatory Visit (HOSPITAL_COMMUNITY): Payer: Self-pay

## 2021-03-28 ENCOUNTER — Ambulatory Visit: Payer: Medicaid Other | Admitting: Family

## 2021-03-29 ENCOUNTER — Ambulatory Visit (INDEPENDENT_AMBULATORY_CARE_PROVIDER_SITE_OTHER): Payer: Medicaid Other | Admitting: Family

## 2021-03-29 ENCOUNTER — Other Ambulatory Visit (HOSPITAL_COMMUNITY): Payer: Self-pay

## 2021-03-29 ENCOUNTER — Other Ambulatory Visit: Payer: Self-pay

## 2021-03-29 ENCOUNTER — Encounter: Payer: Self-pay | Admitting: Family

## 2021-03-29 VITALS — BP 132/86 | HR 79 | Temp 98.6°F | Ht 70.0 in | Wt 270.0 lb

## 2021-03-29 DIAGNOSIS — Z0000 Encounter for general adult medical examination without abnormal findings: Secondary | ICD-10-CM

## 2021-03-29 DIAGNOSIS — B20 Human immunodeficiency virus [HIV] disease: Secondary | ICD-10-CM | POA: Diagnosis present

## 2021-03-29 DIAGNOSIS — R7303 Prediabetes: Secondary | ICD-10-CM

## 2021-03-29 MED ORDER — RILPIVIRINE HCL 25 MG PO TABS: 25.0000 mg | ORAL_TABLET | Freq: Every day | ORAL | 0 refills | Status: DC

## 2021-03-29 MED ORDER — CABOTEGRAVIR SODIUM 30 MG PO TABS: 30.0000 mg | ORAL_TABLET | Freq: Every day | ORAL | 0 refills | Status: DC

## 2021-03-29 MED ORDER — DARUN-COBIC-EMTRICIT-TENOFAF 800-150-200-10 MG PO TABS
1.0000 | ORAL_TABLET | Freq: Every day | ORAL | 5 refills | Status: AC
  Filled 2021-03-29 – 2021-05-03 (×2): qty 30, 30d supply, fill #0
  Filled 2021-05-27: qty 30, 30d supply, fill #1
  Filled 2021-06-28: qty 30, 30d supply, fill #2
  Filled 2021-07-22: qty 30, 30d supply, fill #3
  Filled 2021-08-17: qty 30, 30d supply, fill #4
  Filled 2021-09-23: qty 30, 30d supply, fill #5

## 2021-03-29 NOTE — Progress Notes (Signed)
Brief Narrative   Patient ID: Tiffany Ray, female    DOB: 1975/10/15, 45 y.o.   MRN: [id number]  Tiffany Ray is a 45 y/o AA female diagnosed with HIV in 2009/10 with risk factor of heterosexual contact. Initial viral load and CD4 count are unknown. No history of opportunistic infection. Initial clinic lab work with viral load of 214,000 and CD4 count of 380. Genotype with V106M.  [id number] negative. Previous ART regimen of Atripla.   Subjective:    Chief Complaint  Patient presents with   Follow-up    HPI:  Tiffany Ray is a 45 y.o. female with HIV disease last seen on 09/15/2020 with well-controlled virus and good adherence and tolerance to her ART regimen of Symtuza.  Viral load was undetectable with CD4 count of 699.  Also noted to have prediabetes with A1c of 6.1.  Here today for routine follow-up.  Tiffany Ray has been taking her Symtuza daily as prescribed with no adverse side effects or missed doses since her last office visit.  Overall feeling well today with no new concerns/complaints.  Has interest in long-acting injectable medications. Denies fevers, chills, night sweats, headaches, changes in vision, neck pain/stiffness, nausea, diarrhea, vomiting, lesions or rashes.  Tiffany Ray has no problems obtaining medication from the pharmacy remains covered through [facility].  Denies feelings of being down, depressed, or hopeless recently.  No current recreational or illicit drug use, alcohol consumption and smokes approximately 1 pack of cigarettes per day on average.  Condoms declined.  We will be scheduling annual wellness checks independently.    No Known Allergies    Outpatient Medications Prior to Visit  Medication Sig Dispense Refill   Darunavir-Cobicisctat-Emtricitabine-Tenofovir Alafenamide (SYMTUZA) 800-150-200-10 MG TABS TAKE 1 TABLET BY MOUTH DAILY WITH BREAKFAST. 30 tablet 5   Aspirin-Acetaminophen-Caffeine (GOODYS EXTRA STRENGTH PO) Take 1 Package by mouth daily  as needed (pain). (Patient not taking: No sig reported)     polyethylene glycol (MIRALAX) 17 g packet Take 17 g by mouth daily. (Patient not taking: No sig reported) 14 each 0   senna-docusate (SENOKOT-S) 8.6-50 MG tablet Take 2 tablets by mouth at bedtime. For AFTER surgery, do not take if having diarrhea (Patient not taking: No sig reported) 30 tablet 0   No facility-administered medications prior to visit.     Past Medical History:  Diagnosis Date   Abnormal uterine bleeding (AUB)    Adnexal mass    Anemia    HIV infection ([facility])    Pneumonia    Pre-diabetes    Prediabetes      Past Surgical History:  Procedure Laterality Date   ECTOPIC PREGNANCY SURGERY     ROBOTIC ASSISTED SALPINGO OOPHERECTOMY Bilateral 07/08/2020   Procedure: XI ROBOTIC ASSISTED LEFT SALPINGO OOPHORECTOMY, RIGHT SALPINGECTOMY;  Surgeon: Lafonda Mosses, MD;  Location: [facility];  Service: Gynecology;  Laterality: Bilateral;   TUBAL LIGATION        Review of Systems  Constitutional:  Negative for appetite change, chills, diaphoresis, fatigue, fever and unexpected weight change.  Eyes:        Negative for acute change in vision  Respiratory:  Negative for chest tightness, shortness of breath and wheezing.   Cardiovascular:  Negative for chest pain.  Gastrointestinal:  Negative for diarrhea, nausea and vomiting.  Genitourinary:  Negative for dysuria, pelvic pain and vaginal discharge.  Musculoskeletal:  Negative for neck pain and neck stiffness.  Skin:  Negative for rash.  Neurological:  Negative  for seizures, syncope, weakness and headaches.  Hematological:  Negative for adenopathy. Does not bruise/bleed easily.  Psychiatric/Behavioral:  Negative for hallucinations.      Objective:    BP 132/86   Pulse 79   Temp 98.6 F (37 C) (Oral)   Ht '5\' 10"'$  (1.778 m)   Wt 270 lb (122.5 kg)   SpO2 99%   BMI 38.74 kg/m  Nursing note and vital signs reviewed.  Physical  Exam Constitutional:      General: She is not in acute distress.    Appearance: She is well-developed.  Eyes:     Conjunctiva/sclera: Conjunctivae normal.  Cardiovascular:     Rate and Rhythm: Normal rate and regular rhythm.     Heart sounds: Normal heart sounds. No murmur heard.   No friction rub. No gallop.  Pulmonary:     Effort: Pulmonary effort is normal. No respiratory distress.     Breath sounds: Normal breath sounds. No wheezing or rales.  Chest:     Chest wall: No tenderness.  Abdominal:     General: Bowel sounds are normal.     Palpations: Abdomen is soft.     Tenderness: There is no abdominal tenderness.  Musculoskeletal:     Cervical back: Neck supple.  Lymphadenopathy:     Cervical: No cervical adenopathy.  Skin:    General: Skin is warm and dry.     Findings: No rash.  Neurological:     Mental Status: She is alert and oriented to person, place, and time.  Psychiatric:        Behavior: Behavior normal.        Thought Content: Thought content normal.        Judgment: Judgment normal.     Depression screen [facility] 2/9 03/29/2021 09/15/2020 05/13/2019 11/07/2018 09/13/2018  Decreased Interest 0 0 0 0 0  Down, Depressed, Hopeless 0 0 0 0 0  PHQ - 2 Score 0 0 0 0 0       Assessment & Plan:    Patient Active Problem List   Diagnosis Date Noted   Numbness and tingling 09/15/2020   Post-op pain 07/30/2020   Other constipation 07/30/2020   Endometriosis    Pelvic mass in female 02/23/2020   Pelvic pain in female 02/23/2020   Menorrhagia with irregular cycle 11/04/2019   Screening for cervical cancer 11/04/2019   Prediabetes 10/06/2019   HIV disease ([place]) 09/13/2018   Healthcare maintenance 09/13/2018     Problem List Items Addressed This Visit       Other   HIV disease ([place])    Tiffany Ray continues to have well-controlled virus with good adherence and tolerance to her ART regimen of Symtuza.  No signs/symptoms of opportunistic infection.  We reviewed  previous lab work and discussed plan of care.  She is interested in [place] and will transition to oral lead in. We discussed the risks, benefits, and potential side effects. Check lab work today. Plan for follow up in 1 week once oral lead in is available.       Relevant Medications   Darunavir-Cobicistat-Emtricitabine-Tenofovir Alafenamide (SYMTUZA) 800-150-200-10 MG TABS   Cabotegravir Sodium 30 MG TABS   rilpivirine (EDURANT) 25 MG TABS tablet   Other Relevant Orders   COMPLETE METABOLIC PANEL WITH GFR   T-helper cell (CD4)- ([facility] clinic only)   HIV-1 RNA quant-no reflex-bld   Healthcare maintenance    Discussed importance of safe sexual practices and condom use.  Condoms offered and declined. Scheduling  her routine cervical cancer screening and breast cancer screening independently.      Prediabetes - Primary    Ms. Leff has pre-diabetes with most recent A1c of 6.1. Counseled on nutrition choices and physical activity. Check A1c today.       Relevant Orders   HgB A1c     I have discontinued Mireille M. Gartley's Aspirin-Acetaminophen-Caffeine (GOODYS EXTRA STRENGTH PO), senna-docusate, and polyethylene glycol. I have also changed her Darunavir-Cobicistat-Emtricitabine-Tenofovir Alafenamide. Additionally, I am having her start on Cabotegravir Sodium and rilpivirine.   Meds ordered this encounter  Medications   Darunavir-Cobicistat-Emtricitabine-Tenofovir Alafenamide (SYMTUZA) 800-150-200-10 MG TABS    Sig: Take 1 tablet by mouth daily with breakfast.    Dispense:  30 tablet    Refill:  5    Order Specific Question:   Supervising Provider    Answer:   Baxter Flattery, CYNTHIA [[id number]]   Cabotegravir Sodium 30 MG TABS    Sig: Take 30 mg by mouth daily.    Dispense:  30 tablet    Refill:  0    Order Specific Question:   Supervising Provider    Answer:   Baxter Flattery, CYNTHIA [[id number]]   rilpivirine (EDURANT) 25 MG TABS tablet    Sig: Take 1 tablet (25 mg total) by mouth daily with breakfast.     Dispense:  30 tablet    Refill:  0    Order Specific Question:   Supervising Provider    Answer:   Carlyle Basques [[id number]]    A total of 32 minutes was spent on this visit reviewing previous notes, counseling the patient on Cabenuva (Including benefits, potential side effects, and financial costs), lab results and plan of care, and documenting the findings in the note   Follow-up: Return in about 6 months (around 09/26/2021), or if symptoms worsen or fail to improve.   Terri Piedra, MSN, FNP-C Nurse Practitioner [name] for Infectious Disease [place] number: [phone]

## 2021-03-29 NOTE — Assessment & Plan Note (Signed)
Tiffany Ray has pre-diabetes with most recent A1c of 6.1. Counseled on nutrition choices and physical activity. Check A1c today.

## 2021-03-29 NOTE — Patient Instructions (Addendum)
Nice to see you.  We will check your blood work today.   Continue to take your medication daily as prescribed.   Please call [facility]) to schedule/follow up on your dental care at [phone]  Schedule an appointment for next week with Cassie or Estill Bamberg to get started on oral lead in for [place].   Have a great day and stay safe!

## 2021-03-29 NOTE — Assessment & Plan Note (Signed)
Tiffany Ray continues to have well-controlled virus with good adherence and tolerance to her ART regimen of Symtuza.  No signs/symptoms of opportunistic infection.  We reviewed previous lab work and discussed plan of care.  She is interested in [place] and will transition to oral lead in. We discussed the risks, benefits, and potential side effects. Check lab work today. Plan for follow up in 1 week once oral lead in is available.

## 2021-03-29 NOTE — Assessment & Plan Note (Signed)
   Discussed importance of safe sexual practices and condom use.  Condoms offered and declined.  Scheduling her routine cervical cancer screening and breast cancer screening independently.

## 2021-03-31 ENCOUNTER — Telehealth: Payer: Self-pay

## 2021-03-31 LAB — COMPLETE METABOLIC PANEL WITH GFR
AG Ratio: 1.1 (calc) (ref 1.0–2.5)
ALT: 18 U/L (ref 6–29)
AST: 19 U/L (ref 10–35)
Albumin: 3.9 g/dL (ref 3.6–5.1)
Alkaline phosphatase (APISO): 72 U/L (ref 31–125)
BUN: 13 mg/dL (ref 7–25)
CO2: 29 mmol/L (ref 20–32)
Calcium: 9.7 mg/dL (ref 8.6–10.2)
Chloride: 104 mmol/L (ref 98–110)
Creat: 0.81 mg/dL (ref 0.50–0.99)
Globulin: 3.5 g/dL (calc) (ref 1.9–3.7)
Glucose, Bld: 92 mg/dL (ref 65–99)
Potassium: 4.2 mmol/L (ref 3.5–5.3)
Sodium: 138 mmol/L (ref 135–146)
Total Bilirubin: 0.3 mg/dL (ref 0.2–1.2)
Total Protein: 7.4 g/dL (ref 6.1–8.1)
eGFR: 91 mL/min/{1.73_m2} (ref >=60)

## 2021-03-31 LAB — HEMOGLOBIN A1C
Hgb A1c MFr Bld: 6.2 % of total Hgb — ABNORMAL HIGH (ref <5.7)
Mean Plasma Glucose: 131 mg/dL
eAG (mmol/L): 7.3 mmol/L

## 2021-03-31 LAB — HIV-1 RNA QUANT-NO REFLEX-BLD
HIV 1 RNA Quant: 33 Copies/mL — ABNORMAL HIGH
HIV-1 RNA Quant, Log: 1.51 Log cps/mL — ABNORMAL HIGH

## 2021-03-31 NOTE — Telephone Encounter (Signed)
Received notification from [place] at [facility] that CD4 was not processed in time and will need to be recollected. Will route to provider.   Beryle Flock, RN

## 2021-03-31 NOTE — Telephone Encounter (Signed)
[facility] Patient Advocate Encounter  Patient's medications have been couriered to [facility] from [place] and will be picked up 04/06/21.  Tiffany Ray , [facility] Patient [facility] for Infectious Disease Phone: [phone] Fax:  [phone]

## 2021-04-06 ENCOUNTER — Ambulatory Visit: Payer: Medicaid Other | Admitting: Pharmacist

## 2021-05-03 ENCOUNTER — Other Ambulatory Visit (HOSPITAL_COMMUNITY): Payer: Self-pay

## 2021-05-03 DIAGNOSIS — H52223 Regular astigmatism, bilateral: Secondary | ICD-10-CM | POA: Diagnosis not present

## 2021-05-03 DIAGNOSIS — H524 Presbyopia: Secondary | ICD-10-CM | POA: Diagnosis not present

## 2021-05-05 ENCOUNTER — Other Ambulatory Visit (HOSPITAL_COMMUNITY): Payer: Self-pay

## 2021-05-25 ENCOUNTER — Other Ambulatory Visit (HOSPITAL_COMMUNITY): Payer: Self-pay

## 2021-05-27 ENCOUNTER — Other Ambulatory Visit (HOSPITAL_COMMUNITY): Payer: Self-pay

## 2021-05-30 ENCOUNTER — Other Ambulatory Visit (HOSPITAL_COMMUNITY): Payer: Self-pay

## 2021-06-06 ENCOUNTER — Other Ambulatory Visit (HOSPITAL_COMMUNITY): Payer: Self-pay

## 2021-06-24 ENCOUNTER — Other Ambulatory Visit (HOSPITAL_COMMUNITY): Payer: Self-pay

## 2021-06-28 ENCOUNTER — Other Ambulatory Visit (HOSPITAL_COMMUNITY): Payer: Self-pay

## 2021-07-22 ENCOUNTER — Other Ambulatory Visit (HOSPITAL_COMMUNITY): Payer: Self-pay

## 2021-07-26 ENCOUNTER — Other Ambulatory Visit (HOSPITAL_COMMUNITY): Payer: Self-pay

## 2021-08-17 ENCOUNTER — Other Ambulatory Visit (HOSPITAL_COMMUNITY): Payer: Self-pay

## 2021-08-18 ENCOUNTER — Other Ambulatory Visit (HOSPITAL_COMMUNITY): Payer: Self-pay

## 2021-09-12 ENCOUNTER — Other Ambulatory Visit (HOSPITAL_COMMUNITY): Payer: Self-pay

## 2021-09-14 ENCOUNTER — Other Ambulatory Visit (HOSPITAL_COMMUNITY): Payer: Self-pay

## 2021-09-15 ENCOUNTER — Other Ambulatory Visit (HOSPITAL_COMMUNITY): Payer: Self-pay

## 2021-09-21 DIAGNOSIS — Z23 Encounter for immunization: Secondary | ICD-10-CM | POA: Diagnosis not present

## 2021-09-23 ENCOUNTER — Other Ambulatory Visit (HOSPITAL_COMMUNITY): Payer: Self-pay

## 2021-10-03 ENCOUNTER — Ambulatory Visit (INDEPENDENT_AMBULATORY_CARE_PROVIDER_SITE_OTHER): Payer: Medicaid Other | Admitting: Family

## 2021-10-03 ENCOUNTER — Other Ambulatory Visit (HOSPITAL_COMMUNITY): Payer: Self-pay

## 2021-10-03 ENCOUNTER — Other Ambulatory Visit: Payer: Self-pay

## 2021-10-03 ENCOUNTER — Encounter: Payer: Self-pay | Admitting: Family

## 2021-10-03 VITALS — BP 120/84 | HR 84 | Temp 98.4°F | Ht 70.0 in | Wt 270.0 lb

## 2021-10-03 DIAGNOSIS — R7303 Prediabetes: Secondary | ICD-10-CM

## 2021-10-03 DIAGNOSIS — Z23 Encounter for immunization: Secondary | ICD-10-CM

## 2021-10-03 DIAGNOSIS — Z6838 Body mass index (BMI) 38.0-38.9, adult: Secondary | ICD-10-CM | POA: Diagnosis not present

## 2021-10-03 DIAGNOSIS — B20 Human immunodeficiency virus [HIV] disease: Secondary | ICD-10-CM | POA: Diagnosis not present

## 2021-10-03 DIAGNOSIS — Z9189 Other specified personal risk factors, not elsewhere classified: Secondary | ICD-10-CM

## 2021-10-03 DIAGNOSIS — Z0000 Encounter for general adult medical examination without abnormal findings: Secondary | ICD-10-CM | POA: Diagnosis not present

## 2021-10-03 MED ORDER — CABOTEGRAVIR SODIUM 30 MG PO TABS: 30.0000 mg | ORAL_TABLET | Freq: Every day | ORAL | 0 refills | Status: DC

## 2021-10-03 MED ORDER — RILPIVIRINE HCL 25 MG PO TABS: 25.0000 mg | ORAL_TABLET | Freq: Every day | ORAL | 0 refills | Status: DC

## 2021-10-03 NOTE — Progress Notes (Signed)
? ? ?Brief Narrative  ? ?Patient ID: Tiffany Ray, female    DOB: [birth year], 46 y.o.   MRN: [id number] ? ?Tiffany Ray is a 46 y/o AA female diagnosed with HIV in 2009/10 with risk factor of heterosexual contact. Initial viral load and CD4 count are unknown. No history of opportunistic infection. Initial clinic lab work with viral load of 214,000 and CD4 count of 380. Genotype with V106M.  [id number] negative. Previous ART regimen of Atripla.  ? ?Subjective:  ?  ?Chief Complaint  ?Patient presents with  ? Follow-up  ? ? ?HPI: ? ?Tiffany Ray is a 47 y.o. female With HIV disease last seen on 03/29/2021 with well-controlled virus and good adherence and tolerance to her ART regimen of Symtuza.  Viral load was undetectable with CD4 count unable to be returned secondary to error.  Kidney function, liver function, electrolytes within normal ranges.  We had discussed starting on Cabenuva. Here today for routine follow up.  ? ?Tiffany Ray has been taking her Symtuza daily as prescribed with no adverse side effects.  Overall feeling well today with no new concerns/complaints.  Interested in starting on [place]. Denies fevers, chills, night sweats, headaches, changes in vision, neck pain/stiffness, nausea, diarrhea, vomiting, lesions or rashes. ? ?Tiffany Ray has no problems obtaining medication from the pharmacy remains covered by Medicaid.  Denies feelings of being down, depressed, or hopeless recently.  No current recreational or illicit drug use or alcohol consumption.  Continues to smoke approximately 1/4 pack of cigarettes per day on average.  Contemplating quitting.  Condoms offered.  Healthcare maintenance due includes mammogram, cervical cancer screening, and Menveo. ? ? ?No Known Allergies ? ? ? ?Outpatient Medications Prior to Visit  ?Medication Sig Dispense Refill  ? Darunavir-Cobicistat-Emtricitabine-Tenofovir Alafenamide (SYMTUZA) 800-150-200-10 MG TABS Take 1 tablet by mouth daily with breakfast. 30 tablet 5   ? Cabotegravir Sodium 30 MG TABS Take 30 mg by mouth daily. (Patient not taking: Reported on 10/03/2021) 30 tablet 0  ? rilpivirine (EDURANT) 25 MG TABS tablet Take 1 tablet (25 mg total) by mouth daily with breakfast. (Patient not taking: Reported on 10/03/2021) 30 tablet 0  ? ?No facility-administered medications prior to visit.  ? ? ? ?Past Medical History:  ?Diagnosis Date  ? Abnormal uterine bleeding (AUB)   ? Adnexal mass   ? Anemia   ? HIV infection ([place])   ? Pneumonia   ? Pre-diabetes   ? Prediabetes   ? ? ? ?Past Surgical History:  ?Procedure Laterality Date  ? ECTOPIC PREGNANCY SURGERY    ? ROBOTIC ASSISTED SALPINGO OOPHERECTOMY Bilateral 07/08/2020  ? Procedure: XI ROBOTIC ASSISTED LEFT SALPINGO OOPHORECTOMY, RIGHT SALPINGECTOMY;  Surgeon: Lafonda Mosses, MD;  Location: [facility];  Service: Gynecology;  Laterality: Bilateral;  ? TUBAL LIGATION    ? ? ? ? ?Review of Systems  ?Constitutional:  Negative for appetite change, chills, diaphoresis, fatigue, fever and unexpected weight change.  ?Eyes:   ?     Negative for acute change in vision  ?Respiratory:  Negative for chest tightness, shortness of breath and wheezing.   ?Cardiovascular:  Negative for chest pain.  ?Gastrointestinal:  Negative for diarrhea, nausea and vomiting.  ?Genitourinary:  Negative for dysuria, pelvic pain and vaginal discharge.  ?Musculoskeletal:  Negative for neck pain and neck stiffness.  ?Skin:  Negative for rash.  ?Neurological:  Negative for seizures, syncope, weakness and headaches.  ?Hematological:  Negative for adenopathy. Does not bruise/bleed easily.  ?Psychiatric/Behavioral:  Negative for hallucinations.   ?   ?Objective:  ?  ?BP 120/84   Pulse 84   Temp 98.4 ?F (36.9 ?C) (Oral)   Ht '5\' 10"'$  (1.778 m)   Wt 270 lb (122.5 kg)   SpO2 98%   BMI 38.74 kg/m?  ?Nursing note and vital signs reviewed. ? ?Physical Exam ?Constitutional:   ?   General: She is not in acute distress. ?   Appearance: She is  well-developed.  ?Eyes:  ?   Conjunctiva/sclera: Conjunctivae normal.  ?Cardiovascular:  ?   Rate and Rhythm: Normal rate and regular rhythm.  ?   Heart sounds: Normal heart sounds. No murmur heard. ?  No friction rub. No gallop.  ?Pulmonary:  ?   Effort: Pulmonary effort is normal. No respiratory distress.  ?   Breath sounds: Normal breath sounds. No wheezing or rales.  ?Chest:  ?   Chest wall: No tenderness.  ?Abdominal:  ?   General: Bowel sounds are normal.  ?   Palpations: Abdomen is soft.  ?   Tenderness: There is no abdominal tenderness.  ?Musculoskeletal:  ?   Cervical back: Neck supple.  ?Lymphadenopathy:  ?   Cervical: No cervical adenopathy.  ?Skin: ?   General: Skin is warm and dry.  ?   Findings: No rash.  ?Neurological:  ?   Mental Status: She is alert and oriented to person, place, and time.  ?Psychiatric:     ?   Behavior: Behavior normal.     ?   Thought Content: Thought content normal.     ?   Judgment: Judgment normal.  ? ? ? ?Depression screen [facility] 2/9 10/03/2021 03/29/2021 09/15/2020 05/13/2019 11/07/2018  ?Decreased Interest 0 0 0 0 0  ?Down, Depressed, Hopeless 0 0 0 0 0  ?PHQ - 2 Score 0 0 0 0 0  ?  ?   ?Assessment & Plan:  ? ? ?Patient Active Problem List  ? Diagnosis Date Noted  ? BMI 38.0-38.9,adult 10/03/2021  ? Numbness and tingling 09/15/2020  ? Post-op pain 07/30/2020  ? Other constipation 07/30/2020  ? Endometriosis   ? Pelvic mass in female 02/23/2020  ? Pelvic pain in female 02/23/2020  ? Menorrhagia with irregular cycle 11/04/2019  ? Screening for cervical cancer 11/04/2019  ? Prediabetes 10/06/2019  ? HIV disease ([place]) 09/13/2018  ? Healthcare maintenance 09/13/2018  ? ? ? ?Problem List Items Addressed This Visit   ? ?  ? Other  ? HIV disease ([place]) - Primary  ?  Tiffany Ray continues to have well-controlled virus with good adherence and tolerance to her ART regimen of Symtuza.  No signs/symptoms of opportunistic infection.  Reviewed previous lab work and discussed plan of care.  We will  start oral lead-in for Cabenuva.  Check blood work today.  Continue current dose of Symtuza until oral lead-in is available.  Reviewed the risks and benefits of Cabenuva. Plan for follow up 1 month after start of oral lead in.  ?  ?  ? Relevant Medications  ? rilpivirine (EDURANT) 25 MG TABS tablet  ? Cabotegravir Sodium 30 MG TABS  ? Other Relevant Orders  ? HIV-1 RNA quant-no reflex-bld  ? Comprehensive metabolic panel  ? T-helper cell (CD4)- ([facility] clinic only)  ? Healthcare maintenance  ?  Discussed importance of safe sexual practices and condom use.  Condoms offered. ?Menveo updated. ?Due for routine cervical and breast cancer screenings.  Referral for mammogram sent.  She will make an appointment  with her gynecologist for cervical cancer screening. ?  ?  ? Prediabetes  ?  Ms. Eberle previously had A1c of 6.2 indicating pre-diabetes. Discussed importance of lifestyle changes to prevent progression to diabetes. Has challenges with carbohydrates. Recheck A1c today.  ?  ?  ? Relevant Orders  ? HgB A1c  ? BMI 38.0-38.9,adult  ?  Ms. Teters has a BMI of 38 placing her at increased risk for developing cardiovascular disease and diabetes.  Discussed importance of weight management through lifestyle changes in physical activity.  Continue to monitor ?  ?  ? ?Other Visit Diagnoses   ? ? At risk for breast cancer      ? Relevant Orders  ? MM DIGITAL SCREENING BILATERAL  ? ?  ? ? ? ?I am having Vidal Schwalbe. [place] maintain her Darunavir-Cobicistat-Emtricitabine-Tenofovir Alafenamide, rilpivirine, and Cabotegravir Sodium. ? ? ?Meds ordered this encounter  ?Medications  ? rilpivirine (EDURANT) 25 MG TABS tablet  ?  Sig: Take 1 tablet (25 mg total) by mouth daily with breakfast.  ?  Dispense:  30 tablet  ?  Refill:  0  ?  Order Specific Question:   Supervising Provider  ?  Answer:   Carlyle Basques [[id number]]  ? Cabotegravir Sodium 30 MG TABS  ?  Sig: Take 30 mg by mouth daily.  ?  Dispense:  30 tablet  ?  Refill:  0  ?  Order  Specific Question:   Supervising Provider  ?  Answer:   Carlyle Basques [[id number]]  ? ? ? ?Follow-up: 1 month after start of oral lead in for [place].  ? ? ?Terri Piedra, MSN, FNP-C ?Nurse Practitioner ?[place] [facility]

## 2021-10-03 NOTE — Assessment & Plan Note (Signed)
Tiffany Ray previously had A1c of 6.2 indicating pre-diabetes. Discussed importance of lifestyle changes to prevent progression to diabetes. Has challenges with carbohydrates. Recheck A1c today.  ?

## 2021-10-03 NOTE — Assessment & Plan Note (Signed)
Tiffany Ray has a BMI of 38 placing her at increased risk for developing cardiovascular disease and diabetes.  Discussed importance of weight management through lifestyle changes in physical activity.  Continue to monitor ?

## 2021-10-03 NOTE — Patient Instructions (Addendum)
Nice to see you. ? ?We will check your lab work today. ? ?Continue to take your medication daily as prescribed. ? ?Plan for follow up with pharmacy to start [place].  ? ?Have a great day and stay safe! ? ?

## 2021-10-03 NOTE — Assessment & Plan Note (Signed)
?   Discussed importance of safe sexual practices and condom use.  Condoms offered. ?? Menveo updated. ?? Due for routine cervical and breast cancer screenings.  Referral for mammogram sent.  She will make an appointment with her gynecologist for cervical cancer screening. ?

## 2021-10-03 NOTE — Assessment & Plan Note (Signed)
Tiffany Ray continues to have well-controlled virus with good adherence and tolerance to her ART regimen of Symtuza.  No signs/symptoms of opportunistic infection.  Reviewed previous lab work and discussed plan of care.  We will start oral lead-in for Cabenuva.  Check blood work today.  Continue current dose of Symtuza until oral lead-in is available.  Reviewed the risks and benefits of Cabenuva. Plan for follow up 1 month after start of oral lead in.  ?

## 2021-10-03 NOTE — Addendum Note (Signed)
Addended by: Lin Landsman E on: 10/03/2021 02:12 PM ? ? Modules accepted: Orders ? ?

## 2021-10-04 ENCOUNTER — Telehealth: Payer: Self-pay | Admitting: Pharmacist

## 2021-10-04 ENCOUNTER — Other Ambulatory Visit (HOSPITAL_COMMUNITY): Payer: Self-pay

## 2021-10-04 DIAGNOSIS — B20 Human immunodeficiency virus [HIV] disease: Secondary | ICD-10-CM

## 2021-10-04 LAB — T-HELPER CELL (CD4) - (RCID CLINIC ONLY)
CD4 % Helper T Cell: 44 % (ref 33–65)
CD4 T Cell Abs: 760 /uL (ref 400–1790)

## 2021-10-04 MED ORDER — CABOTEGRAVIR & RILPIVIRINE ER 600 & 900 MG/3ML IM SUER
1.0000 | INTRAMUSCULAR | 5 refills | Status: DC
  Filled 2021-10-04 – 2022-01-10 (×2): qty 6, 60d supply, fill #0

## 2021-10-04 MED ORDER — CABOTEGRAVIR & RILPIVIRINE ER 600 & 900 MG/3ML IM SUER
1.0000 | INTRAMUSCULAR | 1 refills | Status: DC
  Filled 2021-10-04 – 2021-10-05 (×2): qty 6, 30d supply, fill #0
  Filled 2021-10-28: qty 6, 30d supply, fill #1

## 2021-10-04 NOTE — Telephone Encounter (Signed)
Patient approved for [facility]; prefers starting with oral lead-in to assess for tolerability though not required. Scheduled her first injection in 28 days and also sent in injection scripts to [name]. ? ?Counseled patient to take one tablet of rilpivirine + one tablet of cabotegravir once daily WITH food. Encouraged patient to take the tablets around the same time each day and to make sure it is with a meal. Advised to never take one without the other. Discussed possible side effects such as nausea, headache, insomnia, and abnormal dreams. Explained the importance of not missing any doses of the 30 day regimen to make sure no resistance develops. Explained to patient that we will set a target date each month to administer injections. Explained the importance of showing up to appointments and not missing any injections. Warned that if patient misses 2 appointments, it will be reassessed by their provider whether they are a good candidate for injection therapy. Made appointment for patient to come in and receive initial injection 28-30 days after starting oral lead-in therapy. ?  ?Counseled that Kern Reap is two separate intramuscular injections in the gluteal muscle on each side for each monthly visit. Cautioned on possible side effects such as injection-site reactions, fatigue, headache, nasuea, rash, and dizziness. No questions from patient at this time. ? ?Alfonse Spruce, PharmD, CPP ?Clinical Pharmacist Practitioner ?Infectious Diseases Clinical Pharmacist ?[place] [facility] ? ?

## 2021-10-05 ENCOUNTER — Other Ambulatory Visit (HOSPITAL_COMMUNITY): Payer: Self-pay

## 2021-10-05 LAB — HEMOGLOBIN A1C
Hgb A1c MFr Bld: 6.4 % of total Hgb — ABNORMAL HIGH (ref <5.7)
Mean Plasma Glucose: 137 mg/dL
eAG (mmol/L): 7.6 mmol/L

## 2021-10-05 LAB — COMPREHENSIVE METABOLIC PANEL
AG Ratio: 1.2 (calc) (ref 1.0–2.5)
ALT: 17 U/L (ref 6–29)
AST: 16 U/L (ref 10–35)
Albumin: 4.1 g/dL (ref 3.6–5.1)
Alkaline phosphatase (APISO): 81 U/L (ref 31–125)
BUN: 13 mg/dL (ref 7–25)
CO2: 27 mmol/L (ref 20–32)
Calcium: 9.8 mg/dL (ref 8.6–10.2)
Chloride: 104 mmol/L (ref 98–110)
Creat: 0.93 mg/dL (ref 0.50–0.99)
Globulin: 3.5 g/dL (calc) (ref 1.9–3.7)
Glucose, Bld: 129 mg/dL — ABNORMAL HIGH (ref 65–99)
Potassium: 4.1 mmol/L (ref 3.5–5.3)
Sodium: 140 mmol/L (ref 135–146)
Total Bilirubin: 0.3 mg/dL (ref 0.2–1.2)
Total Protein: 7.6 g/dL (ref 6.1–8.1)

## 2021-10-05 LAB — HIV-1 RNA QUANT-NO REFLEX-BLD
HIV 1 RNA Quant: <20 Copies/mL — ABNORMAL HIGH
HIV-1 RNA Quant, Log: <1.3 Log cps/mL — ABNORMAL HIGH

## 2021-10-06 ENCOUNTER — Telehealth: Payer: Self-pay

## 2021-10-06 NOTE — Telephone Encounter (Signed)
[facility] Patient Advocate Encounter ? ?Patient's medication Kern Reap) have been couriered to [facility] from [employer] and will be administered in the patient next office visit on 11/01/21. ? ?Ileene Patrick , [name] ?Specialty Pharmacy Patient Advocate ?[place] for Infect[facility] ?Phone: [phone] ?Fax:  [phone]  ?

## 2021-10-24 ENCOUNTER — Other Ambulatory Visit: Payer: Self-pay | Admitting: Family

## 2021-10-24 DIAGNOSIS — Z1231 Encounter for screening mammogram for malignant neoplasm of breast: Secondary | ICD-10-CM

## 2021-10-25 ENCOUNTER — Ambulatory Visit: Payer: Medicaid Other

## 2021-10-25 ENCOUNTER — Ambulatory Visit
Admission: RE | Admit: 2021-10-25 | Discharge: 2021-10-25 | Disposition: A | Discharge status: Home / Self Care | Payer: Medicaid Other | Source: Ambulatory Visit | Attending: Family | Admitting: Family

## 2021-10-25 DIAGNOSIS — Z1231 Encounter for screening mammogram for malignant neoplasm of breast: Secondary | ICD-10-CM | POA: Diagnosis not present

## 2021-10-28 ENCOUNTER — Other Ambulatory Visit (HOSPITAL_COMMUNITY): Payer: Self-pay

## 2021-10-31 ENCOUNTER — Other Ambulatory Visit (HOSPITAL_COMMUNITY): Payer: Self-pay

## 2021-11-01 ENCOUNTER — Encounter: Payer: Medicaid Other | Admitting: Pharmacist

## 2021-11-01 ENCOUNTER — Telehealth: Payer: Self-pay

## 2021-11-01 ENCOUNTER — Telehealth: Payer: Self-pay | Admitting: Pharmacist

## 2021-11-01 NOTE — Telephone Encounter (Signed)
[facility] Patient Advocate Encounter ? ?Patient's medication Kern Reap) have been couriered to [facility] from [employer] and will be administered on the patient next office visit on 11/01/21. ? ?Ileene Patrick , [name] ?Specialty Pharmacy Patient Advocate ?[place] for [facility] ?Phone: [phone] ?Fax:  [phone]  ?

## 2021-11-01 NOTE — Telephone Encounter (Signed)
Patient forgot about her first injection today; thought it was started two months after oral lead-in. Reviewed again that the oral lead-in was only for one month. She rescheduled for Thursday at 3:15 which is day 30 since starting oral lead-in. ? ?Of note, patient's car is currently not working and she is waiting for maintenance to fix it. Will reach out to find a ride to her visit. Reviewed that we have bus passes here that she can pick up if possible as well. ? ?Alfonse Spruce, PharmD, CPP ?Clinical Pharmacist Practitioner ?Infectious Diseases Clinical Pharmacist ?[place] [facility] ? ?

## 2021-11-03 ENCOUNTER — Ambulatory Visit (INDEPENDENT_AMBULATORY_CARE_PROVIDER_SITE_OTHER): Payer: Medicaid Other | Admitting: Pharmacist

## 2021-11-03 ENCOUNTER — Other Ambulatory Visit: Payer: Self-pay

## 2021-11-03 DIAGNOSIS — B20 Human immunodeficiency virus [HIV] disease: Secondary | ICD-10-CM | POA: Diagnosis not present

## 2021-11-03 MED ORDER — CABOTEGRAVIR & RILPIVIRINE ER 600 & 900 MG/3ML IM SUER
1.0000 | Freq: Once | INTRAMUSCULAR | Status: AC
  Administered 2021-11-03: 1 via INTRAMUSCULAR

## 2021-11-03 NOTE — Progress Notes (Addendum)
? ?HPI: GUDELIA Ray is a 46 y.o. female who presents to the [place] clinic for [place] administration. ? ?Patient Active Problem List  ? Diagnosis Date Noted  ? BMI 38.0-38.9,adult 10/03/2021  ? Numbness and tingling 09/15/2020  ? Post-op pain 07/30/2020  ? Other constipation 07/30/2020  ? Endometriosis   ? Pelvic mass in female 02/23/2020  ? Pelvic pain in female 02/23/2020  ? Menorrhagia with irregular cycle 11/04/2019  ? Screening for cervical cancer 11/04/2019  ? Prediabetes 10/06/2019  ? HIV disease ([place]) 09/13/2018  ? Healthcare maintenance 09/13/2018  ? ? ?Patient's Medications  ?New Prescriptions  ? No medications on file  ?Previous Medications  ? CABOTEGRAVIR & RILPIVIRINE ER (CABENUVA) 600 & 900 MG/3ML INJECTION    Inject 1 kit into the muscle every 30 (thirty) days.  ? CABOTEGRAVIR & RILPIVIRINE ER (CABENUVA) 600 & 900 MG/3ML INJECTION    Inject 1 kit into the muscle every 2 (two) months.  ? CABOTEGRAVIR SODIUM 30 MG TABS    Take 30 mg by mouth daily.  ? RILPIVIRINE (EDURANT) 25 MG TABS TABLET    Take 1 tablet (25 mg total) by mouth daily with breakfast.  ?Modified Medications  ? No medications on file  ?Discontinued Medications  ? No medications on file  ? ? ?Allergies: ?No Known Allergies ? ?Past Medical History: ?Past Medical History:  ?Diagnosis Date  ? Abnormal uterine bleeding (AUB)   ? Adnexal mass   ? Anemia   ? HIV infection ([place])   ? Pneumonia   ? Pre-diabetes   ? Prediabetes   ? ? ?Social History: ?Social History  ? ?Socioeconomic History  ? Marital status: Single  ?  Spouse name: Not on file  ? Number of children: 4  ? Years of education: 26  ? Highest education level: Not on file  ?Occupational History  ? Not on file  ?Tobacco Use  ? Smoking status: Every Day  ?  Packs/day: 0.25  ?  Types: Cigarettes  ? Smokeless tobacco: Never  ? Tobacco comments:  ?  States back and forth with quitting  ?Vaping Use  ? Vaping Use: Never used  ?Substance and Sexual Activity  ? Alcohol use: Never   ? Drug use: Never  ? Sexual activity: Not on file  ?  Comment: declined condoms  ?Other Topics Concern  ? Not on file  ?Social History Narrative  ? Not on file  ? ?Social Determinants of Health  ? ?Financial Resource Strain: Not on file  ?Food Insecurity: Not on file  ?Transportation Needs: Not on file  ?Physical Activity: Not on file  ?Stress: Not on file  ?Social Connections: Not on file  ? ? ?Labs: ?Lab Results  ?Component Value Date  ? HIV1RNAQUANT <20 (H) 10/03/2021  ? HIV1RNAQUANT 33 (H) 03/29/2021  ? HIV1RNAQUANT <20 (H) 09/15/2020  ? CD4TABS 760 10/03/2021  ? CD4TABS 699 09/15/2020  ? CD4TABS 520 02/25/2020  ? ? ?RPR and STI ?Lab Results  ?Component Value Date  ? LABRPR NON-REACTIVE 09/15/2020  ? LABRPR NON-REACTIVE 05/13/2019  ? LABRPR NON-REACTIVE 08/27/2018  ? ? ?STI Results GC CT  ?09/23/2019 ?12:27 PM Negative   Negative    ? ? ?Hepatitis B ?Lab Results  ?Component Value Date  ? HEPBSAB NON-REACTIVE 08/27/2018  ? HEPBSAG NON-REACTIVE 08/27/2018  ? HEPBCAB NON-REACTIVE 08/27/2018  ? ?Hepatitis C ?Lab Results  ?Component Value Date  ? HEPCAB NON-REACTIVE 08/27/2018  ? ?Hepatitis A ?Lab Results  ?Component Value Date  ?  HAV NON-REACTIVE 08/27/2018  ? ?Lipids: ?Lab Results  ?Component Value Date  ? CHOL 264 (H) 09/15/2020  ? TRIG 144 09/15/2020  ? HDL 64 09/15/2020  ? CHOLHDL 4.1 09/15/2020  ? LDLCALC 171 (H) 09/15/2020  ? ? ?TARGET DATE: ?20th of month ? ?Assessment:  ?Tiffany Ray presents today for their first initiation injection for Cabenuva. Patient completed oral cabotegravir/rilpivirine lead-in. Patient reports tolerating oral therapy well with no side effects.  ? ?Counseled that Kern Reap is two separate intramuscular injections in the gluteal muscle on each side for each visit. Explained that the second injection is 30 days after the initial injection then every 2 months thereafter. Discussed the need for viral load monitoring every 2 months for the first 6 months and then periodically afterwards as  their provider sees the need. Explained that showing up to injection appointments is very important and warned that if 2 appointments are missed, it will be reassessed by their provider whether they are a good candidate for injection therapy. Discussed that the 20th of the month will be her target date and that she must get her injections within a 7 day window from the 20th. Counseled on possible side effects associated with the injections such as injection site pain, which is usually mild to moderate in nature, injection site nodules, and injection site reactions. Advised that they can take ibuprofen or tylenol for injection site pain if needed.  ? ?Administered cabotegravir 684m/3mL in left upper outer quadrant of the gluteal muscle. Administered rilpivirine 900 mg/381min the right upper outer quadrant of the gluteal muscle. Monitored patient for 10 minutes after injection. Injections were tolerated well without issue. Will make follow up appointments for second initiation injection in 30 days and then maintenance injections every 2 months thereafter.  ? ?Will check HIV RNA and lipids panel at next visit. Patient is also due for Prevnar 20, Hep A vaccine, and Hep B vaccine. Will discuss these vaccines with patient at next visit.  ? ?Plan: ?- First Cabenuva injections administered ?- Second initiation injection scheduled for 5/16 with AmEstill Bamberg- Maintenance injections scheduled for 7/13 with AmEstill Bamberg?- Call with any issues or questions ? ?VaMar[place]e AltPharmD Candidate ?11/03/2021 5:00PM ? ? ? ? ?

## 2021-11-29 ENCOUNTER — Encounter: Payer: Medicaid Other | Admitting: Pharmacist

## 2021-11-30 ENCOUNTER — Ambulatory Visit (INDEPENDENT_AMBULATORY_CARE_PROVIDER_SITE_OTHER): Payer: Medicaid Other | Admitting: Pharmacist

## 2021-11-30 ENCOUNTER — Ambulatory Visit (INDEPENDENT_AMBULATORY_CARE_PROVIDER_SITE_OTHER): Payer: Medicaid Other

## 2021-11-30 ENCOUNTER — Other Ambulatory Visit: Payer: Self-pay

## 2021-11-30 DIAGNOSIS — B20 Human immunodeficiency virus [HIV] disease: Secondary | ICD-10-CM | POA: Diagnosis not present

## 2021-11-30 DIAGNOSIS — Z23 Encounter for immunization: Secondary | ICD-10-CM

## 2021-11-30 DIAGNOSIS — R7612 Nonspecific reaction to cell mediated immunity measurement of gamma interferon antigen response without active tuberculosis: Secondary | ICD-10-CM

## 2021-11-30 DIAGNOSIS — Z79899 Other long term (current) drug therapy: Secondary | ICD-10-CM | POA: Diagnosis not present

## 2021-11-30 MED ORDER — CABOTEGRAVIR & RILPIVIRINE ER 600 & 900 MG/3ML IM SUER
1.0000 | Freq: Once | INTRAMUSCULAR | Status: AC
  Administered 2021-11-30: 1 via INTRAMUSCULAR

## 2021-11-30 NOTE — Progress Notes (Signed)
? ?  Covid-19 Vaccination Clinic ? ?Name:  Tiffany Ray    ?MRN: [id number] ?DOB: [birth year] ? ?11/30/2021 ? ?Tiffany Ray was observed post Covid-19 immunization for 15 minutes without incident. She was provided with Vaccine Information Sheet and instruction to access the V-Safe system.  ? ?Tiffany Ray was instructed to call 911 with any severe reactions post vaccine: ?Difficulty breathing  ?Swelling of face and throat  ?A fast heartbeat  ?A bad rash all over body  ?Dizziness and weakness  ? ?Immunizations Administered   ? ? Name Date Dose VIS Date Route  ? Ambulance person Booster 11/30/2021 11:39 AM 0.3 mL 03/16/2021 Intramuscular  ? Manufacturer: [place]: [id number]  ? [place]: [phone]  ? ?  ? ? ?

## 2021-11-30 NOTE — Progress Notes (Signed)
? ?HPI: Tiffany Ray is a 46 y.o. female who presents to the [place] clinic for [place] administration. ? ?Patient Active Problem List  ? Diagnosis Date Noted  ? BMI 38.0-38.9,adult 10/03/2021  ? Numbness and tingling 09/15/2020  ? Post-op pain 07/30/2020  ? Other constipation 07/30/2020  ? Endometriosis   ? Pelvic mass in female 02/23/2020  ? Pelvic pain in female 02/23/2020  ? Menorrhagia with irregular cycle 11/04/2019  ? Screening for cervical cancer 11/04/2019  ? Prediabetes 10/06/2019  ? HIV disease ([place]) 09/13/2018  ? Healthcare maintenance 09/13/2018  ? ? ?Patient's Medications  ?New Prescriptions  ? No medications on file  ?Previous Medications  ? CABOTEGRAVIR & RILPIVIRINE ER (CABENUVA) 600 & 900 MG/3ML INJECTION    Inject 1 kit into the muscle every 30 (thirty) days.  ? CABOTEGRAVIR & RILPIVIRINE ER (CABENUVA) 600 & 900 MG/3ML INJECTION    Inject 1 kit into the muscle every 2 (two) months.  ?Modified Medications  ? No medications on file  ?Discontinued Medications  ? No medications on file  ? ? ?Allergies: ?No Known Allergies ? ?Past Medical History: ?Past Medical History:  ?Diagnosis Date  ? Abnormal uterine bleeding (AUB)   ? Adnexal mass   ? Anemia   ? HIV infection ([place])   ? Pneumonia   ? Pre-diabetes   ? Prediabetes   ? ? ?Social History: ?Social History  ? ?Socioeconomic History  ? Marital status: Single  ?  Spouse name: Not on file  ? Number of children: 4  ? Years of education: 62  ? Highest education level: Not on file  ?Occupational History  ? Not on file  ?Tobacco Use  ? Smoking status: Every Day  ?  Packs/day: 0.25  ?  Types: Cigarettes  ? Smokeless tobacco: Never  ? Tobacco comments:  ?  States back and forth with quitting  ?Vaping Use  ? Vaping Use: Never used  ?Substance and Sexual Activity  ? Alcohol use: Never  ? Drug use: Never  ? Sexual activity: Not on file  ?  Comment: declined condoms  ?Other Topics Concern  ? Not on file  ?Social History Narrative  ? Not on file   ? ?Social Determinants of Health  ? ?Financial Resource Strain: Not on file  ?Food Insecurity: Not on file  ?Transportation Needs: Not on file  ?Physical Activity: Not on file  ?Stress: Not on file  ?Social Connections: Not on file  ? ? ?Labs: ?Lab Results  ?Component Value Date  ? HIV1RNAQUANT <20 (H) 10/03/2021  ? HIV1RNAQUANT 33 (H) 03/29/2021  ? HIV1RNAQUANT <20 (H) 09/15/2020  ? CD4TABS 760 10/03/2021  ? CD4TABS 699 09/15/2020  ? CD4TABS 520 02/25/2020  ? ? ?RPR and STI ?Lab Results  ?Component Value Date  ? LABRPR NON-REACTIVE 09/15/2020  ? LABRPR NON-REACTIVE 05/13/2019  ? LABRPR NON-REACTIVE 08/27/2018  ? ? ?STI Results GC CT  ?09/23/2019 ?12:27 PM Negative   Negative    ? ? ?Hepatitis B ?Lab Results  ?Component Value Date  ? HEPBSAB NON-REACTIVE 08/27/2018  ? HEPBSAG NON-REACTIVE 08/27/2018  ? HEPBCAB NON-REACTIVE 08/27/2018  ? ?Hepatitis C ?Lab Results  ?Component Value Date  ? HEPCAB NON-REACTIVE 08/27/2018  ? ?Hepatitis A ?Lab Results  ?Component Value Date  ? HAV NON-REACTIVE 08/27/2018  ? ?Lipids: ?Lab Results  ?Component Value Date  ? CHOL 264 (H) 09/15/2020  ? TRIG 144 09/15/2020  ? HDL 64 09/15/2020  ? CHOLHDL 4.1 09/15/2020  ? [place] 171 (H)  09/15/2020  ? ? ?TARGET DATE: ?20th of the month ? ?Assessment: ?[name] presents today for their maintenance Cabenuva injections. Past injections were tolerated well without issues. No problems with systemic side effects of injections. Patient did experience some soreness following the first injection with a knot on the left gluteal muscle. Both were self resolving. She reports no new sexual partners and politely declines STI testing today. She received HIV RNA and the lipid panel for monitoring. She qualified for 5 vaccines today, COVID bivalent, Tdap, Hepatitis A, Hepatitis B, and Prevnar 20. She accepted all 5 vaccines today. She also is starting a new job next week and needed a Quantirfer[employer] TB blood test. She requests the results from the Tb test be sent  to her via email. ? ?Administered cabotegravir 629m/3mL in left upper outer quadrant of the gluteal muscle. Administered rilpivirine 900 mg/337min the right upper outer quadrant of the gluteal muscle. Injections were tolerated well. Patient will follow up in 2 months for next set of injections. ? ?Plan: ?- Cabenuva injections administered ?- Next injections scheduled for 7/13 with Dr. WoRogers Blocker- Call with any issues or questions ?- Administered HepA, HepB, COVID, Tdap, and Prevnar20 today (next HepB injection due after 12/2021 and HepA injection due after 05/2022) ?- Follow up on HIV RNA and lipid panel as necessary ? ?MaVarney DailyPharmD ?PGY1 Pharmacy Resident ?[facility] ? ?

## 2021-12-05 LAB — LIPID PANEL
Cholesterol: 256 mg/dL — ABNORMAL HIGH (ref <200)
HDL: 60 mg/dL (ref >=50)
LDL Cholesterol (Calc): 162 mg/dL (calc) — ABNORMAL HIGH
Non-HDL Cholesterol (Calc): 196 mg/dL (calc) — ABNORMAL HIGH (ref <130)
Total CHOL/HDL Ratio: 4.3 (calc) (ref <5.0)
Triglycerides: 183 mg/dL — ABNORMAL HIGH (ref <150)

## 2021-12-05 LAB — HIV-1 RNA QUANT-NO REFLEX-BLD
HIV 1 RNA Quant: <20 copies/mL — AB
HIV-1 RNA Quant, Log: <1.3 Log copies/mL — AB

## 2021-12-05 LAB — QUANTIFERON-TB GOLD PLUS
Mitogen-NIL: >10 IU/mL
NIL: 0.04 IU/mL
QuantiFERON-TB Gold Plus: NEGATIVE
TB1-NIL: 0.03 IU/mL
TB2-NIL: 0.01 IU/mL

## 2021-12-06 ENCOUNTER — Encounter: Payer: Self-pay | Admitting: Pharmacist

## 2022-01-09 ENCOUNTER — Inpatient Hospital Stay: Payer: Medicaid Other | Admitting: Gynecologic Oncology

## 2022-01-10 ENCOUNTER — Other Ambulatory Visit (HOSPITAL_COMMUNITY): Payer: Self-pay

## 2022-01-11 ENCOUNTER — Other Ambulatory Visit (HOSPITAL_COMMUNITY): Payer: Self-pay

## 2022-01-12 ENCOUNTER — Telehealth: Payer: Self-pay

## 2022-01-12 ENCOUNTER — Other Ambulatory Visit (HOSPITAL_COMMUNITY): Payer: Self-pay

## 2022-01-12 NOTE — Telephone Encounter (Signed)
[facility] Patient Advocate Encounter  Patient's medication Kern Reap) have been couriered to [facility] from [employer] and will be administered on the patient next office visit on 01/26/22.  Ileene Patrick , [facility] Patient [facility] Phone: [phone] Fax:  [phone]

## 2022-01-26 ENCOUNTER — Encounter: Payer: Medicaid Other | Admitting: Pharmacist

## 2022-01-26 ENCOUNTER — Other Ambulatory Visit: Payer: Self-pay

## 2022-01-26 ENCOUNTER — Telehealth: Payer: Self-pay

## 2022-01-26 ENCOUNTER — Ambulatory Visit (INDEPENDENT_AMBULATORY_CARE_PROVIDER_SITE_OTHER): Payer: Medicaid Other | Admitting: Pharmacist

## 2022-01-26 DIAGNOSIS — B20 Human immunodeficiency virus [HIV] disease: Secondary | ICD-10-CM | POA: Diagnosis not present

## 2022-01-26 DIAGNOSIS — Z23 Encounter for immunization: Secondary | ICD-10-CM

## 2022-01-26 MED ORDER — CABOTEGRAVIR & RILPIVIRINE ER 600 & 900 MG/3ML IM SUER
1.0000 | Freq: Once | INTRAMUSCULAR | Status: AC
  Administered 2022-01-26: 1 via INTRAMUSCULAR

## 2022-01-26 NOTE — Progress Notes (Signed)
HPI: Tiffany Ray is a 46 y.o. female who presents to the [facility] [place] [facility] for Tiffany Ray administration.  Patient Active Problem List   Diagnosis Date Noted   BMI 38.0-38.9,adult 10/03/2021   Numbness and tingling 09/15/2020   Post-op pain 07/30/2020   Other constipation 07/30/2020   Endometriosis    Pelvic mass in female 02/23/2020   Pelvic pain in female 02/23/2020   Menorrhagia with irregular cycle 11/04/2019   Screening for cervical cancer 11/04/2019   Prediabetes 10/06/2019   HIV disease ([place]) 09/13/2018   Healthcare maintenance 09/13/2018    Patient's Medications  New Prescriptions   No medications on file  Previous Medications   CABOTEGRAVIR & RILPIVIRINE ER (CABENUVA) 600 & 900 MG/3ML INJECTION    Inject 1 kit into the muscle every 30 (thirty) days.   CABOTEGRAVIR & RILPIVIRINE ER (CABENUVA) 600 & 900 MG/3ML INJECTION    Inject 1 kit into the muscle every 2 (two) months.  Modified Medications   No medications on file  Discontinued Medications   No medications on file    Allergies: No Known Allergies  Past Medical History: Past Medical History:  Diagnosis Date   Abnormal uterine bleeding (AUB)    Adnexal mass    Anemia    HIV infection (HCC)    Pneumonia    Pre-diabetes    Prediabetes     Social History: Social History   Socioeconomic History   Marital status: Single    Spouse name: Not on file   Number of children: 4   Years of education: 12   Highest education level: Not on file  Occupational History   Not on file  Tobacco Use   Smoking status: Every Day    Packs/day: 0.25    Types: Cigarettes   Smokeless tobacco: Never   Tobacco comments:    States back and forth with quitting  Vaping Use   Vaping Use: Never used  Substance and Sexual Activity   Alcohol use: Never   Drug use: Never   Sexual activity: Not on file    Comment: declined condoms  Other Topics Concern   Not on file  Social History Narrative   Not on file    Social Determinants of Health   Financial Resource Strain: Not on file  Food Insecurity: Not on file  Transportation Needs: Not on file  Physical Activity: Not on file  Stress: Not on file  Social Connections: Not on file    Labs: Lab Results  Component Value Date   HIV1RNAQUANT <20 DETECTED (A) 11/30/2021   HIV1RNAQUANT <20 (H) 10/03/2021   HIV1RNAQUANT 33 (H) 03/29/2021   CD4TABS 760 10/03/2021   CD4TABS 699 09/15/2020   CD4TABS 520 02/25/2020    RPR and STI Lab Results  Component Value Date   LABRPR NON-REACTIVE 09/15/2020   LABRPR NON-REACTIVE 05/13/2019   LABRPR NON-REACTIVE 08/27/2018    STI Results GC CT  09/23/2019 12:27 PM Negative  Negative     Hepatitis B Lab Results  Component Value Date   HEPBSAB NON-REACTIVE 08/27/2018   HEPBSAG NON-REACTIVE 08/27/2018   HEPBCAB NON-REACTIVE 08/27/2018   Hepatitis C Lab Results  Component Value Date   HEPCAB NON-REACTIVE 08/27/2018   Hepatitis A Lab Results  Component Value Date   HAV NON-REACTIVE 08/27/2018   Lipids: Lab Results  Component Value Date   CHOL 256 (H) 11/30/2021   TRIG 183 (H) 11/30/2021   HDL 60 11/30/2021   CHOLHDL 4.3 11/30/2021   LDLCALC 162 (H)  11/30/2021    TARGET DATE:  The 20th of the month  Current HIV Regimen: Cabenuva  Assessment: Tiffany Ray presents today for their maintenance Cabenuva injections. Initial/past injections were tolerated well without issues. No problems with systemic effects of injections. Administered cabotegravir 641m/3mL in left upper outer quadrant of the gluteal muscle. Administered rilpivirine 900 mg/336min the right upper outer quadrant of the gluteal muscle. Monitored patient for 10 minutes after injection. Injections were tolerated well without issue. Patient will follow up in 2 months for next injection. Will check HIV RNA today.  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 She  states her shoes she is required to wear through her job cause her right foot and entire leg to swell during the day. Encouraged her to try compression stockings throughout her shift as well as icing her knee often. She stated she needs a note to change the type of shoe she wears to work. Will follow-up with Tiffany Amsleromorrow about this.   Patient denies any sexual activity since last visit and politely declines STI testing. Administered second hepatitis B vaccine today; will recheck Hepatitis B surface antibody at her next follow-up.   Plan: - Cabenuva injections administered - Check HIV RNA - Administer Heplisav #2 - Next injections scheduled for 9/13 with Tiffany Amslernd 11/14 with me  - Follow-up with Tiffany Amsleromorrow for knee swelling  - Call with any issues or questions  Tiffany SprucePharmD, CPP, BCOldsmarlinical Pharmacist Practitioner [place] [facility]

## 2022-01-26 NOTE — Telephone Encounter (Signed)
Called patient to follow up on letter that was requested today at pharmacy appointment. Spoke with patient who states that the shoes that her employer provided her has been causing her legs to swell. Has noticed this for two months. Would like letter stating she can wear alternative shoes. Will forward message to provider. Leatrice Jewels, RMA

## 2022-01-27 ENCOUNTER — Encounter: Payer: Self-pay | Admitting: Family

## 2022-01-27 ENCOUNTER — Ambulatory Visit (INDEPENDENT_AMBULATORY_CARE_PROVIDER_SITE_OTHER): Payer: Medicaid Other | Admitting: Family

## 2022-01-27 ENCOUNTER — Other Ambulatory Visit: Payer: Self-pay

## 2022-01-27 VITALS — BP 145/97 | HR 84 | Temp 98.2°F | Wt 272.0 lb

## 2022-01-27 DIAGNOSIS — M25561 Pain in right knee: Secondary | ICD-10-CM | POA: Diagnosis not present

## 2022-01-27 DIAGNOSIS — G8929 Other chronic pain: Secondary | ICD-10-CM | POA: Insufficient documentation

## 2022-01-27 MED ORDER — MELOXICAM 15 MG PO TBDP: 15.0000 mg | ORAL_TABLET | Freq: Every day | ORAL | 0 refills | Status: DC

## 2022-01-27 MED ORDER — DICLOFENAC SODIUM 1 % EX GEL: 2.0000 g | Freq: Four times a day (QID) | CUTANEOUS | 0 refills | Status: DC

## 2022-01-27 NOTE — Assessment & Plan Note (Signed)
Tiffany Ray has acute onset right knee pain with concern for possible meniscus pathology. Will start with conservative treatment with ice and meloxicam and Voltaren gel. Can wear alternative foot wear. Continue neoprene sleeve for compression. May need follow up with Orthopedics or Sports Medicine if symptoms worsen or do not improve.

## 2022-01-27 NOTE — Progress Notes (Signed)
Subjective:    Patient ID: Tiffany Ray, female    DOB: 01/26/1976, 46 y.o.   MRN: [id number]  Chief Complaint  Patient presents with   Follow-up    Knee pain x 1 month.     HPI:  Tiffany Ray is a 46 y.o. female with well controlled HIV disease presents today for an acute office visit.  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 Denies any sounds/sensations heard or felt. Pain is primarily on the lateral side and is worse after work and the evenings. Treatment to date has included Goodies powder and a neoprene sleeve that has not helped. Has also noticed the sneakers she wears at work are another aggravating factor for Tiffany Ray knee pain. No previous history of trauma or injury to either knee.   No Known Allergies     Outpatient Medications Prior to Visit  Medication Sig Dispense Refill   cabotegravir & rilpivirine ER (CABENUVA) 600 & 900 MG/3ML injection Inject 1 kit into the muscle every 30 (thirty) days. 6 mL 1   cabotegravir & rilpivirine ER (CABENUVA) 600 & 900 MG/3ML injection Inject 1 kit into the muscle every 2 (two) months. 6 mL 5   No facility-administered medications prior to visit.     Past Medical History:  Diagnosis Date   Abnormal uterine bleeding (AUB)    Adnexal mass    Anemia    HIV infection (HCC)    Pneumonia    Pre-diabetes    Prediabetes      Past Surgical History:  Procedure Laterality Date   ECTOPIC PREGNANCY SURGERY     ROBOTIC ASSISTED SALPINGO OOPHERECTOMY Bilateral 07/08/2020   Procedure: XI ROBOTIC ASSISTED LEFT SALPINGO OOPHORECTOMY, RIGHT SALPINGECTOMY;  Surgeon: Lafonda Mosses, MD;  Location: [facility];  Service: Gynecology;  Laterality: Bilateral;   TUBAL LIGATION         Review of Systems  Constitutional:  Negative for chills, diaphoresis, fatigue and fever.  Musculoskeletal:         Right knee pain      Objective:    BP (!) 145/97   Pulse 84   Temp 98.2 F (36.8 C) (Oral)   Wt 272 lb (123.4 kg)   LMP 06/05/2020   SpO2 96%   BMI 39.03 kg/m  Nursing note and vital signs reviewed.  Physical Exam Constitutional:      General: She is not in acute distress.    Appearance: She is not ill-appearing.  Musculoskeletal:     Comments: Right knee with no obvious deformity or discoloration. There is mild-moderate amount of edema and slight increase in temperature compared to the left. Tenderness along the lateral joint line. Range of motion near complete with pain with knee flexion. Negative anterior draw, valgus/varus, with questionable positive McMurrays that increases pain.   Neurological:     Mental Status: She is alert.         10/03/2021    1:38 PM 03/29/2021   11:29 AM 09/15/2020    2:11 PM 05/13/2019    3:41 PM 11/07/2018    2:47 PM  Depression screen PHQ 2/9  Decreased Interest 0 0 0 0 0  Down, Depressed, Hopeless 0 0 0 0 0  PHQ - 2 Score 0 0 0 0 0       Assessment & Plan:  Patient Active Problem List   Diagnosis Date Noted   Acute pain of right knee 01/27/2022   BMI 38.0-38.9,adult 10/03/2021   Numbness and tingling 09/15/2020   Post-op pain 07/30/2020   Other constipation 07/30/2020   Endometriosis    Pelvic mass in female 02/23/2020   Pelvic pain in female 02/23/2020   Menorrhagia with irregular cycle 11/04/2019   Screening for cervical cancer 11/04/2019   Prediabetes 10/06/2019   HIV disease ([place]) 09/13/2018   Healthcare maintenance 09/13/2018     Problem List Items Addressed This Visit       Other   Acute pain of right knee - Primary    Tiffany Ray has acute onset right knee pain with concern for possible meniscus pathology. Will start with conservative treatment with ice and meloxicam and Voltaren gel. Can wear alternative foot wear. Continue neoprene sleeve for compression. May need follow up with Orthopedics or Sports Medicine  if symptoms worsen or do not improve.       Relevant Medications   Meloxicam 15 MG TBDP   diclofenac Sodium (VOLTAREN) 1 % GEL     I am having Tiffany Ray start on Meloxicam and diclofenac Sodium. I am also having Tiffany Ray maintain Tiffany Ray cabotegravir & rilpivirine ER and cabotegravir & rilpivirine ER.   Meds ordered this encounter  Medications   Meloxicam 15 MG TBDP    Sig: Take 15 mg by mouth daily.    Dispense:  30 tablet    Refill:  0    Order Specific Question:   Supervising Provider    Answer:   Baxter Flattery, CYNTHIA [[id number]]   diclofenac Sodium (VOLTAREN) 1 % GEL    Sig: Apply 2 g topically 4 (four) times daily.    Dispense:  50 g    Refill:  0    Order Specific Question:   Supervising Provider    Answer:   Carlyle Basques [[id number]]     Follow-up: If symptoms worsen or do not improve.    Terri Piedra, MSN, FNP-C Nurse Practitioner [facility] [place] number: [phone]

## 2022-01-27 NOTE — Patient Instructions (Addendum)
Nice to see you.  Ice x 20 minutes every 2 hours and after work.  Start taking the mobic daily. Do not take any additional ibuprofen. Tylenol is okay.  Use the Voltaren Gel as needed.   If your symptoms worsen or do not improve we may need to send you to Orthopedics/Sports Medicine.   Have a great day and stay safe!

## 2022-01-30 LAB — HIV-1 RNA QUANT-NO REFLEX-BLD
HIV 1 RNA Quant: NOT DETECTED Copies/mL
HIV-1 RNA Quant, Log: NOT DETECTED Log cps/mL

## 2022-02-07 ENCOUNTER — Encounter: Payer: Self-pay | Admitting: Family

## 2022-02-07 ENCOUNTER — Encounter: Payer: Self-pay | Admitting: Gynecologic Oncology

## 2022-02-09 ENCOUNTER — Inpatient Hospital Stay: Payer: Medicaid Other | Admitting: Gynecologic Oncology

## 2022-02-09 ENCOUNTER — Telehealth: Payer: Self-pay

## 2022-02-09 DIAGNOSIS — R1900 Intra-abdominal and pelvic swelling, mass and lump, unspecified site: Secondary | ICD-10-CM

## 2022-02-09 NOTE — Telephone Encounter (Signed)
Pt states she was on the schedule for today just for a check up and no problems nor concerns. Per Dr.Tucker appointment is not needed and she can see her GYN,Dr.Arnold, as needed for any concerns. Pt voiced an understanding and was thankful for the call.

## 2022-02-23 ENCOUNTER — Other Ambulatory Visit: Payer: Self-pay | Admitting: Family

## 2022-02-23 DIAGNOSIS — M25561 Pain in right knee: Secondary | ICD-10-CM

## 2022-02-23 NOTE — Telephone Encounter (Signed)
Okay to refill? 

## 2022-03-15 ENCOUNTER — Other Ambulatory Visit: Payer: Self-pay | Admitting: Pharmacist

## 2022-03-15 ENCOUNTER — Other Ambulatory Visit (HOSPITAL_COMMUNITY): Payer: Self-pay

## 2022-03-15 DIAGNOSIS — B20 Human immunodeficiency virus [HIV] disease: Secondary | ICD-10-CM

## 2022-03-15 MED ORDER — CABOTEGRAVIR & RILPIVIRINE ER 600 & 900 MG/3ML IM SUER
1.0000 | INTRAMUSCULAR | 5 refills | Status: DC
  Filled 2022-03-15 (×2): qty 6, 60d supply, fill #0
  Filled 2022-05-09: qty 6, 60d supply, fill #1
  Filled 2022-08-01: qty 6, 60d supply, fill #2
  Filled 2022-09-21: qty 6, 60d supply, fill #3
  Filled 2022-11-16: qty 6, 60d supply, fill #4
  Filled 2023-01-23 – 2023-01-25 (×3): qty 6, 60d supply, fill #5

## 2022-03-16 ENCOUNTER — Other Ambulatory Visit (HOSPITAL_COMMUNITY): Payer: Self-pay

## 2022-03-17 ENCOUNTER — Other Ambulatory Visit (HOSPITAL_COMMUNITY): Payer: Self-pay

## 2022-03-21 ENCOUNTER — Telehealth: Payer: Self-pay

## 2022-03-21 NOTE — Telephone Encounter (Signed)
[facility] Patient Advocate Encounter  Patient's medication Kern Reap) have been couriered to [facility] from [employer] and will be administered on the patient next office visit on 03/29/22.  Ileene Patrick , [facility] Patient [name] Phone: [phone] Fax:  [phone]

## 2022-03-29 ENCOUNTER — Encounter: Payer: Medicaid Other | Admitting: Family

## 2022-04-02 ENCOUNTER — Emergency Department (HOSPITAL_COMMUNITY)
Admission: EM | Admit: 2022-04-02 | Discharge: 2022-04-03 | Disposition: A | Discharge status: Home / Self Care | Payer: Medicaid Other | Attending: Emergency Medicine | Admitting: Emergency Medicine

## 2022-04-02 ENCOUNTER — Encounter (HOSPITAL_COMMUNITY): Payer: Self-pay | Admitting: Emergency Medicine

## 2022-04-02 DIAGNOSIS — U071 COVID-19: Secondary | ICD-10-CM | POA: Insufficient documentation

## 2022-04-02 DIAGNOSIS — Z21 Asymptomatic human immunodeficiency virus [HIV] infection status: Secondary | ICD-10-CM | POA: Insufficient documentation

## 2022-04-02 DIAGNOSIS — R5383 Other fatigue: Secondary | ICD-10-CM | POA: Diagnosis present

## 2022-04-02 LAB — COMPREHENSIVE METABOLIC PANEL
ALT: 29 U/L (ref 0–44)
AST: 29 U/L (ref 15–41)
Albumin: 3.5 g/dL (ref 3.5–5.0)
Alkaline Phosphatase: 74 U/L (ref 38–126)
Anion gap: 11 (ref 5–15)
BUN: 9 mg/dL (ref 6–20)
CO2: 23 mmol/L (ref 22–32)
Calcium: 9.3 mg/dL (ref 8.9–10.3)
Chloride: 104 mmol/L (ref 98–111)
Creatinine, Ser: 0.98 mg/dL (ref 0.44–1.00)
GFR, Estimated: >60 mL/min (ref >60)
Glucose, Bld: 113 mg/dL — ABNORMAL HIGH (ref 70–99)
Potassium: 3.6 mmol/L (ref 3.5–5.1)
Sodium: 138 mmol/L (ref 135–145)
Total Bilirubin: 0.6 mg/dL (ref 0.3–1.2)
Total Protein: 7.4 g/dL (ref 6.5–8.1)

## 2022-04-02 LAB — URINALYSIS, ROUTINE W REFLEX MICROSCOPIC
Bilirubin Urine: NEGATIVE
Glucose, UA: NEGATIVE mg/dL
Ketones, ur: NEGATIVE mg/dL
Leukocytes,Ua: NEGATIVE
Nitrite: NEGATIVE
Protein, ur: NEGATIVE mg/dL
Specific Gravity, Urine: 1.012 (ref 1.005–1.030)
pH: 6 (ref 5.0–8.0)

## 2022-04-02 LAB — CBC
HCT: 40.5 % (ref 36.0–46.0)
Hemoglobin: 13 g/dL (ref 12.0–15.0)
MCH: 28.8 pg (ref 26.0–34.0)
MCHC: 32.1 g/dL (ref 30.0–36.0)
MCV: 89.6 fL (ref 80.0–100.0)
Platelets: 245 10*3/uL (ref 150–400)
RBC: 4.52 MIL/uL (ref 3.87–5.11)
RDW: 13 % (ref 11.5–15.5)
WBC: 5 10*3/uL (ref 4.0–10.5)
nRBC: 0 % (ref 0.0–0.2)

## 2022-04-02 LAB — LIPASE, BLOOD: Lipase: 30 U/L (ref 11–51)

## 2022-04-02 MED ORDER — ONDANSETRON HCL 4 MG/2ML IJ SOLN
4.0000 mg | Freq: Once | INTRAMUSCULAR | Status: AC
  Administered 2022-04-02: 4 mg via INTRAVENOUS
  Filled 2022-04-02: qty 2

## 2022-04-02 MED ORDER — ACETAMINOPHEN 325 MG PO TABS
650.0000 mg | ORAL_TABLET | Freq: Once | ORAL | Status: AC
  Administered 2022-04-02: 650 mg via ORAL
  Filled 2022-04-02: qty 2

## 2022-04-02 MED ORDER — KETOROLAC TROMETHAMINE 15 MG/ML IJ SOLN
15.0000 mg | Freq: Once | INTRAMUSCULAR | Status: AC
  Administered 2022-04-02: 15 mg via INTRAVENOUS
  Filled 2022-04-02: qty 1

## 2022-04-02 MED ORDER — SODIUM CHLORIDE 0.9 % IV BOLUS
1000.0000 mL | Freq: Once | INTRAVENOUS | Status: AC
  Administered 2022-04-02: 1000 mL via INTRAVENOUS

## 2022-04-02 NOTE — ED Provider Notes (Signed)
[place] EMERGENCY DEPARTMENT Provider Note   CSN: [id number] Arrival date & time: 04/02/22  1858     History {Add pertinent medical, surgical, social history, OB history to HPI:1} Chief Complaint  Patient presents with   Emesis    Tiffany Ray is a 46 y.o. female.  46 year old female with a history of well-controlled HIV disease (last CD4 count 760), prediabetes, anemia presents to the emergency department for flu-like illness which began this morning.  She notes onset of generalized body aches, fatigue, and malaise as well as congestion, cough, nausea.  She has had some diarrhea as well as sporadic vomiting. She has not taken any medications for symptoms. 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   The history is provided by the patient (patient's partner). No language interpreter was used.  Emesis      Home Medications Prior to Admission medications   Medication Sig Start Date End Date Taking? Authorizing Provider  cabotegravir & rilpivirine ER (CABENUVA) 600 & 900 MG/3ML injection Inject 1 kit into the muscle every 2 (two) months. 03/15/22   Kuppelweiser, Cassie L, RPH-CPP  diclofenac Sodium (VOLTAREN) 1 % GEL Apply 2 g topically 4 (four) times daily. 01/27/22   Golden Circle, FNP  meloxicam (MOBIC) 15 MG tablet TAKE 1 TABLET BY MOUTH EVERY DAY 02/23/22   Golden Circle, FNP      Allergies    Patient has no known allergies.    Review of Systems   Review of Systems  Gastrointestinal:  Positive for vomiting.  Ten systems reviewed and are negative for acute change, except as noted in the HPI.    Physical Exam Updated Vital Signs BP (!) 157/110 (BP Location: Right Arm)   Pulse 93   Temp (!) 100.7 F (38.2 C) (Oral)   Resp 19   LMP 06/05/2020   SpO2 99%   Physical Exam Vitals and nursing note reviewed.  Constitutional:      General: She is not in acute distress.     Appearance: She is well-developed. She is not diaphoretic.     Comments: Obese AA female. Nontoxic appearing.  HENT:     Head: Normocephalic and atraumatic.     Nose: Congestion (mild) present.  Eyes:     General: No scleral icterus.    Conjunctiva/sclera: Conjunctivae normal.  Cardiovascular:     Rate and Rhythm: Normal rate and regular rhythm.     Pulses: Normal pulses.  Pulmonary:     Effort: Pulmonary effort is normal. No respiratory distress.     Comments: Respirations even and unlabored Musculoskeletal:        General: Normal range of motion.     Cervical back: Normal range of motion.  Skin:    General: Skin is warm and dry.     Coloration: Skin is not pale.     Findings: No erythema or rash.  Neurological:     Mental Status: She is alert and oriented to person, place, and time.     Coordination: Coordination normal.  Psychiatric:        Behavior: Behavior normal.     ED Results / Procedures / Treatments   Labs (all labs ordered are listed, but only abnormal results are displayed) Labs Reviewed  COMPREHENSIVE METABOLIC PANEL - Abnormal; Notable for the following components:      Result Value   Glucose, Bld 113 (*)  All other components within normal limits  URINALYSIS, ROUTINE W REFLEX MICROSCOPIC - Abnormal; Notable for the following components:   Hgb urine dipstick MODERATE (*)    Bacteria, UA RARE (*)    All other components within normal limits  SARS CORONAVIRUS 2 BY RT PCR  LIPASE, BLOOD  CBC    EKG None  Radiology No results found.  Procedures Procedures  {Document cardiac monitor, telemetry assessment procedure when appropriate:1}  Medications Ordered in ED Medications  sodium chloride 0.9 % bolus 1,000 mL (has no administration in time range)  acetaminophen (TYLENOL) tablet 650 mg (has no administration in time range)  ketorolac (TORADOL) 15 MG/ML injection 15 mg (has no administration in time range)  ondansetron (ZOFRAN) injection 4 mg  (has no administration in time range)    ED Course/ Medical Decision Making/ A&P                           Medical Decision Making Amount and/or Complexity of Data Reviewed Labs: ordered.  Risk OTC drugs. Prescription drug management.   ***  {Document critical care time when appropriate:1} {Document review of labs and clinical decision tools ie heart score, Chads2Vasc2 etc:1}  {Document your independent review of radiology images, and any outside records:1} {Document your discussion with family members, caretakers, and with consultants:1} {Document social determinants of health affecting pt's care:1} {Document your decision making why or why not admission, treatments were needed:1} Final Clinical Impression(s) / ED Diagnoses Final diagnoses:  None    Rx / DC Orders ED Discharge Orders     None

## 2022-04-02 NOTE — ED Triage Notes (Signed)
Patient complains of generalized body aches and diarrhea since this morning. Patient is alert, oriented, speaking in complete sentences, and is in no apparent distress at this time.

## 2022-04-03 LAB — SARS CORONAVIRUS 2 BY RT PCR: SARS Coronavirus 2 by RT PCR: POSITIVE — AB

## 2022-04-03 MED ORDER — ONDANSETRON 4 MG PO TBDP: 4.0000 mg | ORAL_TABLET | Freq: Three times a day (TID) | ORAL | 0 refills | Status: DC | PRN

## 2022-04-03 MED ORDER — BENZONATATE 100 MG PO CAPS: 100.0000 mg | ORAL_CAPSULE | Freq: Three times a day (TID) | ORAL | 0 refills | Status: DC | PRN

## 2022-04-03 NOTE — Discharge Instructions (Addendum)
You have tested positive for COVID-19 in the ED today.  Take tylenol for fever, headaches, body aches. Drink plenty of fluids to prevent dehydration. You may continue to use other over-the-counter remedies for symptom control, if desired. Once returning to work, wear a mask for an additional 5 days (until 04/12/22)  Return for new or concerning symptoms such as worsening shortness of breath, coughing up blood, persistent vomiting, loss of consciousness.

## 2022-04-11 NOTE — Telephone Encounter (Signed)
Patient called to confirm appointment with Pharmacy clinic and request to have flu vaccination at this time. Appointment notes updated.   Eugenia Mcalpine, LPN

## 2022-04-12 ENCOUNTER — Ambulatory Visit (INDEPENDENT_AMBULATORY_CARE_PROVIDER_SITE_OTHER): Payer: Medicaid Other | Admitting: Pharmacist

## 2022-04-12 ENCOUNTER — Other Ambulatory Visit: Payer: Self-pay

## 2022-04-12 DIAGNOSIS — Z23 Encounter for immunization: Secondary | ICD-10-CM

## 2022-04-12 DIAGNOSIS — B20 Human immunodeficiency virus [HIV] disease: Secondary | ICD-10-CM

## 2022-04-12 MED ORDER — CABOTEGRAVIR & RILPIVIRINE ER 600 & 900 MG/3ML IM SUER
1.0000 | Freq: Once | INTRAMUSCULAR | Status: AC
  Administered 2022-04-12: 1 via INTRAMUSCULAR

## 2022-04-12 NOTE — Progress Notes (Signed)
HPI: MISTI TOWLE is a 46 y.o. female who presents to the  clinic for [place] administration.  Patient Active Problem List   Diagnosis Date Noted   Acute pain of right knee 01/27/2022   BMI 38.0-38.9,adult 10/03/2021   Numbness and tingling 09/15/2020   Post-op pain 07/30/2020   Other constipation 07/30/2020   Endometriosis    Pelvic mass in female 02/23/2020   Pelvic pain in female 02/23/2020   Menorrhagia with irregular cycle 11/04/2019   Screening for cervical cancer 11/04/2019   Prediabetes 10/06/2019   HIV disease ([place]) 09/13/2018   Healthcare maintenance 09/13/2018    Patient's Medications  New Prescriptions   No medications on file  Previous Medications   BENZONATATE (TESSALON) 100 MG CAPSULE    Take 1 capsule (100 mg total) by mouth 3 (three) times daily as needed for cough.   CABOTEGRAVIR & RILPIVIRINE ER (CABENUVA) 600 & 900 MG/3ML INJECTION    Inject 1 kit into the muscle every 2 (two) months.   DICLOFENAC SODIUM (VOLTAREN) 1 % GEL    Apply 2 g topically 4 (four) times daily.   MELOXICAM (MOBIC) 15 MG TABLET    TAKE 1 TABLET BY MOUTH EVERY DAY   ONDANSETRON (ZOFRAN-ODT) 4 MG DISINTEGRATING TABLET    Take 1 tablet (4 mg total) by mouth every 8 (eight) hours as needed for nausea or vomiting.  Modified Medications   No medications on file  Discontinued Medications   No medications on file    Allergies: No Known Allergies  Past Medical History: Past Medical History:  Diagnosis Date   Abnormal uterine bleeding (AUB)    Adnexal mass    Anemia    HIV infection (HCC)    Pneumonia    Pre-diabetes    Prediabetes     Social History: Social History   Socioeconomic History   Marital status: Single    Spouse name: Not on file   Number of children: 4   Years of education: 12   Highest education level: Not on file  Occupational History   Not on file  Tobacco Use   Smoking status: Every Day    Packs/day: 0.25    Types: Cigarettes    Smokeless tobacco: Never   Tobacco comments:    States back and forth with quitting  Vaping Use   Vaping Use: Never used  Substance and Sexual Activity   Alcohol use: Never   Drug use: Never   Sexual activity: Not on file    Comment: declined condoms  Other Topics Concern   Not on file  Social History Narrative   Not on file   Social Determinants of Health   Financial Resource Strain: Not on file  Food Insecurity: Not on file  Transportation Needs: Not on file  Physical Activity: Not on file  Stress: Not on file  Social Connections: Not on file    Labs: Lab Results  Component Value Date   HIV1RNAQUANT Not Detected 01/26/2022   HIV1RNAQUANT <20 DETECTED (A) 11/30/2021   HIV1RNAQUANT <20 (H) 10/03/2021   CD4TABS 760 10/03/2021   CD4TABS 699 09/15/2020   CD4TABS 520 02/25/2020    RPR and STI Lab Results  Component Value Date   LABRPR NON-REACTIVE 09/15/2020   LABRPR NON-REACTIVE 05/13/2019   LABRPR NON-REACTIVE 08/27/2018    STI Results GC CT  09/23/2019 12:27 PM Negative  Negative     Hepatitis B Lab Results  Component Value Date   HEPBSAB NON-REACTIVE 08/27/2018  HEPBSAG NON-REACTIVE 08/27/2018   HEPBCAB NON-REACTIVE 08/27/2018   Hepatitis C Lab Results  Component Value Date   HEPCAB NON-REACTIVE 08/27/2018   Hepatitis A Lab Results  Component Value Date   HAV NON-REACTIVE 08/27/2018   Lipids: Lab Results  Component Value Date   CHOL 256 (H) 11/30/2021   TRIG 183 (H) 11/30/2021   HDL 60 11/30/2021   CHOLHDL 4.3 11/30/2021   LDLCALC 162 (H) 11/30/2021    TARGET DATE: Th 20th  Assessment: [place] presents today for her maintenance Cabenuva injections. Past injections were tolerated well without issues.  Administered cabotegravir 634m/3mL in left upper outer quadrant of the gluteal muscle. Administered rilpivirine 900 mg/384min the right upper outer quadrant of the gluteal muscle. No issues with injections. She will follow up in 2 months  for next set of injections.  Administered flu vaccine today as well.  Plan: - Cabenuva injections administered - HIV RNA today - Flu vaccine - Next injections scheduled for 05/30/22 with AmEstill Bamberg Call with any issues or questions  Yesica Kemler L. Hailea Eaglin, PharmD, BCIDP, AAHIVP, CPElliottlinical Pharmacist Practitioner [place] [facility]

## 2022-04-15 LAB — HIV-1 RNA QUANT-NO REFLEX-BLD
HIV 1 RNA Quant: NOT DETECTED Copies/mL
HIV-1 RNA Quant, Log: NOT DETECTED Log cps/mL

## 2022-05-09 ENCOUNTER — Other Ambulatory Visit (HOSPITAL_COMMUNITY): Payer: Self-pay

## 2022-05-10 ENCOUNTER — Other Ambulatory Visit (HOSPITAL_COMMUNITY): Payer: Self-pay

## 2022-05-11 ENCOUNTER — Telehealth: Payer: Self-pay

## 2022-05-11 ENCOUNTER — Other Ambulatory Visit (HOSPITAL_COMMUNITY): Payer: Self-pay

## 2022-05-11 NOTE — Telephone Encounter (Signed)
[facility] Patient Advocate Encounter  Patient's medication Kern Reap) have been couriered to [facility] from [employer] and will be administered on the patient next office visit on 05/30/22.  Ileene Patrick , [facility] Patient [facility] Phone: [phone] Fax:  [phone]

## 2022-05-30 ENCOUNTER — Encounter: Payer: Medicaid Other | Admitting: Pharmacist

## 2022-05-30 ENCOUNTER — Encounter: Payer: Self-pay | Admitting: Pharmacist

## 2022-06-06 NOTE — Progress Notes (Incomplete)
HPI: Tiffany Ray is a 46 y.o. female who presents to the [facility] for [place] administration.  Patient Active Problem List   Diagnosis Date Noted   Acute pain of right knee 01/27/2022   BMI 38.0-38.9,adult 10/03/2021   Numbness and tingling 09/15/2020   Post-op pain 07/30/2020   Other constipation 07/30/2020   Endometriosis    Pelvic mass in female 02/23/2020   Pelvic pain in female 02/23/2020   Menorrhagia with irregular cycle 11/04/2019   Screening for cervical cancer 11/04/2019   Prediabetes 10/06/2019   HIV disease ([place]) 09/13/2018   Healthcare maintenance 09/13/2018    Patient's Medications  New Prescriptions   No medications on file  Previous Medications   BENZONATATE (TESSALON) 100 MG CAPSULE    Take 1 capsule (100 mg total) by mouth 3 (three) times daily as needed for cough.   CABOTEGRAVIR & RILPIVIRINE ER (CABENUVA) 600 & 900 MG/3ML INJECTION    Inject 1 kit into the muscle every 2 (two) months.   DICLOFENAC SODIUM (VOLTAREN) 1 % GEL    Apply 2 g topically 4 (four) times daily.   MELOXICAM (MOBIC) 15 MG TABLET    TAKE 1 TABLET BY MOUTH EVERY DAY   ONDANSETRON (ZOFRAN-ODT) 4 MG DISINTEGRATING TABLET    Take 1 tablet (4 mg total) by mouth every 8 (eight) hours as needed for nausea or vomiting.  Modified Medications   No medications on file  Discontinued Medications   No medications on file    Allergies: No Known Allergies  Past Medical History: Past Medical History:  Diagnosis Date   Abnormal uterine bleeding (AUB)    Adnexal mass    Anemia    HIV infection (HCC)    Pneumonia    Pre-diabetes    Prediabetes     Social History: Social History   Socioeconomic History   Marital status: Single    Spouse name: Not on file   Number of children: 4   Years of education: 12   Highest education level: Not on file  Occupational History   Not on file  Tobacco Use   Smoking status: Every Day    Packs/day: 0.25    Types: Cigarettes    Smokeless tobacco: Never   Tobacco comments:    States back and forth with quitting  Vaping Use   Vaping Use: Never used  Substance and Sexual Activity   Alcohol use: Never   Drug use: Never   Sexual activity: Not on file    Comment: declined condoms  Other Topics Concern   Not on file  Social History Narrative   Not on file   Social Determinants of Health   Financial Resource Strain: Not on file  Food Insecurity: Not on file  Transportation Needs: Not on file  Physical Activity: Not on file  Stress: Not on file  Social Connections: Not on file    Labs: Lab Results  Component Value Date   HIV1RNAQUANT Not Detected 04/12/2022   HIV1RNAQUANT Not Detected 01/26/2022   HIV1RNAQUANT <20 DETECTED (A) 11/30/2021   CD4TABS 760 10/03/2021   CD4TABS 699 09/15/2020   CD4TABS 520 02/25/2020    RPR and STI Lab Results  Component Value Date   LABRPR NON-REACTIVE 09/15/2020   LABRPR NON-REACTIVE 05/13/2019   LABRPR NON-REACTIVE 08/27/2018    STI Results GC CT  09/23/2019 12:27 PM Negative  Negative     Hepatitis B Lab Results  Component Value Date   HEPBSAB NON-REACTIVE 08/27/2018  HEPBSAG NON-REACTIVE 08/27/2018   HEPBCAB NON-REACTIVE 08/27/2018   Hepatitis C Lab Results  Component Value Date   HEPCAB NON-REACTIVE 08/27/2018   Hepatitis A Lab Results  Component Value Date   HAV NON-REACTIVE 08/27/2018   Lipids: Lab Results  Component Value Date   CHOL 256 (H) 11/30/2021   TRIG 183 (H) 11/30/2021   HDL 60 11/30/2021   CHOLHDL 4.3 11/30/2021   LDLCALC 162 (H) 11/30/2021    TARGET DATE: The 20th of the month  Assessment: Tiffany Ray presents today for her maintenance Cabenuva injections. Past injections were tolerated well without issues. She was offered STI screening and condoms which she ______.  She is eligible for the COVID and 2nd Hep A vaccines. After discussing the risks and benefits, she ____.  Administered cabotegravir 695m/3mL in left  upper outer quadrant of the gluteal muscle. Administered rilpivirine 900 mg/380min the right upper outer quadrant of the gluteal muscle. No issues with injections. *** will follow up in 2 months for next set of injections.  Plan: - Cabenuva injections administered - Administered flu and 2nd Hep A vaccines today - Collect HIV RNA and Hep B Surface Ab testing today - Next injections scheduled for *** with Cassie - Collect Hep A Ab testing at next visit - Call with any issues or questions  [facility]

## 2022-06-07 ENCOUNTER — Encounter: Payer: Medicaid Other | Admitting: Pharmacist

## 2022-06-14 NOTE — Progress Notes (Signed)
HPI: Tiffany Ray is a 46 y.o. female who presents to the [facility] clinic for [place] administration.  Patient Active Problem List   Diagnosis Date Noted   Acute pain of right knee 01/27/2022   BMI 38.0-38.9,adult 10/03/2021   Numbness and tingling 09/15/2020   Post-op pain 07/30/2020   Other constipation 07/30/2020   Endometriosis    Pelvic mass in female 02/23/2020   Pelvic pain in female 02/23/2020   Menorrhagia with irregular cycle 11/04/2019   Screening for cervical cancer 11/04/2019   Prediabetes 10/06/2019   HIV disease ([place]) 09/13/2018   Healthcare maintenance 09/13/2018    Patient's Medications  New Prescriptions   No medications on file  Previous Medications   BENZONATATE (TESSALON) 100 MG CAPSULE    Take 1 capsule (100 mg total) by mouth 3 (three) times daily as needed for cough.   CABOTEGRAVIR & RILPIVIRINE ER (CABENUVA) 600 & 900 MG/3ML INJECTION    Inject 1 kit into the muscle every 2 (two) months.   DICLOFENAC SODIUM (VOLTAREN) 1 % GEL    Apply 2 g topically 4 (four) times daily.   MELOXICAM (MOBIC) 15 MG TABLET    TAKE 1 TABLET BY MOUTH EVERY DAY   ONDANSETRON (ZOFRAN-ODT) 4 MG DISINTEGRATING TABLET    Take 1 tablet (4 mg total) by mouth every 8 (eight) hours as needed for nausea or vomiting.  Modified Medications   No medications on file  Discontinued Medications   No medications on file    Allergies: No Known Allergies  Past Medical History: Past Medical History:  Diagnosis Date   Abnormal uterine bleeding (AUB)    Adnexal mass    Anemia    HIV infection (HCC)    Pneumonia    Pre-diabetes    Prediabetes     Social History: Social History   Socioeconomic History   Marital status: Single    Spouse name: Not on file   Number of children: 4   Years of education: 12   Highest education level: Not on file  Occupational History   Not on file  Tobacco Use   Smoking status: Every Day    Packs/day: 0.25    Types: Cigarettes    Smokeless tobacco: Never   Tobacco comments:    States back and forth with quitting  Vaping Use   Vaping Use: Never used  Substance and Sexual Activity   Alcohol use: Never   Drug use: Never   Sexual activity: Not on file    Comment: declined condoms  Other Topics Concern   Not on file  Social History Narrative   Not on file   Social Determinants of Health   Financial Resource Strain: Not on file  Food Insecurity: Not on file  Transportation Needs: Not on file  Physical Activity: Not on file  Stress: Not on file  Social Connections: Not on file    Labs: Lab Results  Component Value Date   HIV1RNAQUANT Not Detected 04/12/2022   HIV1RNAQUANT Not Detected 01/26/2022   HIV1RNAQUANT <20 DETECTED (A) 11/30/2021   CD4TABS 760 10/03/2021   CD4TABS 699 09/15/2020   CD4TABS 520 02/25/2020    RPR and STI Lab Results  Component Value Date   LABRPR NON-REACTIVE 09/15/2020   LABRPR NON-REACTIVE 05/13/2019   LABRPR NON-REACTIVE 08/27/2018    STI Results GC CT  09/23/2019 12:27 PM Negative  Negative     Hepatitis B Lab Results  Component Value Date   HEPBSAB NON-REACTIVE 08/27/2018  HEPBSAG NON-REACTIVE 08/27/2018   HEPBCAB NON-REACTIVE 08/27/2018   Hepatitis C Lab Results  Component Value Date   HEPCAB NON-REACTIVE 08/27/2018   Hepatitis A Lab Results  Component Value Date   HAV NON-REACTIVE 08/27/2018   Lipids: Lab Results  Component Value Date   CHOL 256 (H) 11/30/2021   TRIG 183 (H) 11/30/2021   HDL 60 11/30/2021   CHOLHDL 4.3 11/30/2021   LDLCALC 162 (H) 11/30/2021    TARGET DATE: The 20th of the month  Assessment: Tiffany Ray presents today for her maintenance Cabenuva injections, but is outside her target window. Past injections were tolerated well without issues.  She is eligible for the flu and 2nd Hep A vaccines. After discussing the risks and benefits, she ____.  Administered cabotegravir 623m/3mL in left upper outer quadrant of the  gluteal muscle. Administered rilpivirine 900 mg/371min the right upper outer quadrant of the gluteal muscle. No issues with injections. She will follow up in 2 months for next set of injections.  Plan: - Cabenuva injections administered - Next injections scheduled for *** with Amanda/ GrTerri PiedraP - Call with any issues or questions  [facility]

## 2022-06-15 ENCOUNTER — Other Ambulatory Visit: Payer: Self-pay

## 2022-06-15 ENCOUNTER — Ambulatory Visit (INDEPENDENT_AMBULATORY_CARE_PROVIDER_SITE_OTHER): Payer: Medicaid Other | Admitting: Pharmacist

## 2022-06-15 DIAGNOSIS — B20 Human immunodeficiency virus [HIV] disease: Secondary | ICD-10-CM

## 2022-06-15 DIAGNOSIS — Z23 Encounter for immunization: Secondary | ICD-10-CM | POA: Diagnosis not present

## 2022-06-15 MED ORDER — CABOTEGRAVIR & RILPIVIRINE ER 600 & 900 MG/3ML IM SUER
1.0000 | Freq: Once | INTRAMUSCULAR | Status: AC
  Administered 2022-06-15: 1 via INTRAMUSCULAR

## 2022-06-19 LAB — HIV-1 RNA QUANT-NO REFLEX-BLD
HIV 1 RNA Quant: NOT DETECTED Copies/mL
HIV-1 RNA Quant, Log: NOT DETECTED Log cps/mL

## 2022-06-19 LAB — HEPATITIS B SURFACE ANTIBODY,QUALITATIVE: Hep B S Ab: REACTIVE — AB

## 2022-06-29 ENCOUNTER — Other Ambulatory Visit (HOSPITAL_COMMUNITY): Payer: Self-pay

## 2022-07-13 ENCOUNTER — Ambulatory Visit (INDEPENDENT_AMBULATORY_CARE_PROVIDER_SITE_OTHER): Payer: Medicaid Other | Admitting: Family

## 2022-07-13 ENCOUNTER — Encounter: Payer: Self-pay | Admitting: Family

## 2022-07-13 ENCOUNTER — Other Ambulatory Visit: Payer: Self-pay

## 2022-07-13 VITALS — BP 135/89 | HR 95 | Temp 97.8°F | Ht 70.0 in | Wt 274.0 lb

## 2022-07-13 DIAGNOSIS — B20 Human immunodeficiency virus [HIV] disease: Secondary | ICD-10-CM | POA: Diagnosis not present

## 2022-07-13 DIAGNOSIS — M25561 Pain in right knee: Secondary | ICD-10-CM

## 2022-07-13 DIAGNOSIS — Z79899 Other long term (current) drug therapy: Secondary | ICD-10-CM | POA: Diagnosis not present

## 2022-07-13 DIAGNOSIS — N921 Excessive and frequent menstruation with irregular cycle: Secondary | ICD-10-CM

## 2022-07-13 DIAGNOSIS — R7303 Prediabetes: Secondary | ICD-10-CM | POA: Diagnosis not present

## 2022-07-13 NOTE — Assessment & Plan Note (Signed)
Tiffany Ray has continued knee pain despite conservative therapy. Will need imaging for further evaluation and will refer to Orthopedics or Sports Medicine for further evaluation and treatment. Continue with conservative therapy of ice and home exercise therapy while awaiting referral.

## 2022-07-13 NOTE — Progress Notes (Signed)
  Brief Narrative   Patient ID: Tiffany Ray, female    DOB: 08/09/1975, 46 y.o.   MRN: [id number]  Ms. Forero is a 46 y/o AA female diagnosed with HIV in 2009/10 with risk factor of heterosexual contact. Initial viral load and CD4 count are unknown. No history of opportunistic infection. Initial clinic lab work with viral load of 214,000 and CD4 count of 380. Genotype with V106M.  [id number] negative. Previous ART regimen of Atripla.    Subjective:    Chief Complaint  Patient presents with   Follow-up    HPI:  Tiffany Ray is a 46 y.o. female with HIV disease last seen on 06/15/22 with well controlled virus and good adherence and tolerance to Cabenuva. Viral load was undetectable. Here today for follow up.  Ms. Predmore has been doing well with Cabenuva with no adverse side effects since starting medication. Feeling well overall however continues to have right knee pain that has been refractory to previously prescribed Voltaren gel and meloxicam. No new trauma or injury. Has clicking/popping/catching on occasion with pain along the lateral side that is worse at night. Has also tried biofreeze to help with the pain and swelling. Condoms and STD testing offered. Has had some issues with hot flashes recently and wondering about menopause treatments.    No Known Allergies    Outpatient Medications Prior to Visit  Medication Sig Dispense Refill   cabotegravir & rilpivirine ER (CABENUVA) 600 & 900 MG/3ML injection Inject 1 kit into the muscle every 2 (two) months. 6 mL 5   benzonatate (TESSALON) 100 MG capsule Take 1 capsule (100 mg total) by mouth 3 (three) times daily as needed for cough. (Patient not taking: Reported on 07/13/2022) 15 capsule 0   diclofenac Sodium (VOLTAREN) 1 % GEL Apply 2 g topically 4 (four) times daily. (Patient not taking: Reported on 07/13/2022) 50 g 0   meloxicam (MOBIC) 15 MG tablet TAKE 1 TABLET BY MOUTH EVERY DAY (Patient not taking: Reported on 07/13/2022)  30 tablet 0   ondansetron (ZOFRAN-ODT) 4 MG disintegrating tablet Take 1 tablet (4 mg total) by mouth every 8 (eight) hours as needed for nausea or vomiting. (Patient not taking: Reported on 07/13/2022) 10 tablet 0   No facility-administered medications prior to visit.     Past Medical History:  Diagnosis Date   Abnormal uterine bleeding (AUB)    Adnexal mass    Anemia    HIV infection (HCC)    Pneumonia    Pre-diabetes    Prediabetes      Past Surgical History:  Procedure Laterality Date   ECTOPIC PREGNANCY SURGERY     ROBOTIC ASSISTED SALPINGO OOPHERECTOMY Bilateral 07/08/2020   Procedure: XI ROBOTIC ASSISTED LEFT SALPINGO OOPHORECTOMY, RIGHT SALPINGECTOMY;  Surgeon: Tucker, Katherine R, MD;  Location: [facility];  Service: Gynecology;  Laterality: Bilateral;   TUBAL LIGATION        Review of Systems  Constitutional:  Negative for chills, diaphoresis, fatigue and fever.  Respiratory:  Negative for cough, chest tightness, shortness of breath and wheezing.   Cardiovascular:  Negative for chest pain.  Gastrointestinal:  Negative for abdominal pain, diarrhea, nausea and vomiting.  Musculoskeletal:        Right knee pain      Objective:    BP 135/89   Pulse 95   Temp 97.8 F (36.6 C) (Temporal)   Ht 5' 10" (1.778 m)   Wt 274 lb (124.3 kg)   LMP 06/05/2020     SpO2 97%   BMI 39.31 kg/m  Nursing note and vital signs reviewed.  Physical Exam Constitutional:      General: She is not in acute distress.    Appearance: She is well-developed. She is obese.  Cardiovascular:     Rate and Rhythm: Normal rate and regular rhythm.     Heart sounds: Normal heart sounds.  Pulmonary:     Effort: Pulmonary effort is normal.     Breath sounds: Normal breath sounds.  Skin:    General: Skin is warm and dry.  Neurological:     Mental Status: She is alert and oriented to person, place, and time.  Psychiatric:        Behavior: Behavior normal.        Thought  Content: Thought content normal.        Judgment: Judgment normal.         07/13/2022    3:03 PM 10/03/2021    1:38 PM 03/29/2021   11:29 AM 09/15/2020    2:11 PM 05/13/2019    3:41 PM  Depression screen PHQ 2/9  Decreased Interest 0 0 0 0 0  Down, Depressed, Hopeless 0 0 0 0 0  PHQ - 2 Score 0 0 0 0 0       Assessment & Plan:    Patient Active Problem List   Diagnosis Date Noted   Acute pain of right knee 01/27/2022   BMI 38.0-38.9,adult 10/03/2021   Numbness and tingling 09/15/2020   Post-op pain 07/30/2020   Other constipation 07/30/2020   Endometriosis    Pelvic mass in female 02/23/2020   Pelvic pain in female 02/23/2020   Menorrhagia with irregular cycle 11/04/2019   Screening for cervical cancer 11/04/2019   Prediabetes 10/06/2019   HIV disease (HCC) 09/13/2018   Healthcare maintenance 09/13/2018     Problem List Items Addressed This Visit       Other   HIV disease (HCC) - Primary    Marene has well controlled virus with good adherence and tolerance to Cabenuva. Due for next injection in one month. Check lab work today. Plan for follow up in 1 month or sooner if needed for next injection.       Relevant Orders   BASIC METABOLIC PANEL WITH GFR (Completed)   HIV-1 RNA quant-no reflex-bld (Completed)   T-helper cell (CD4)- ([facility] clinic only) (Completed)   Prediabetes    Previous A1c of 6.4 and will recheck A1c today. Enouraged continue lifestyle management with nutrition and physical activity. If increased may need to consider metformin.      Menorrhagia with irregular cycle   Relevant Orders   Ambulatory referral to Gynecology   Acute pain of right knee    Glynnis has continued knee pain despite conservative therapy. Will need imaging for further evaluation and will refer to Orthopedics or Sports Medicine for further evaluation and treatment. Continue with conservative therapy of ice and home exercise therapy while awaiting referral.       Relevant  Orders   AMB referral to sports medicine   Other Visit Diagnoses     Pharmacologic therapy       Relevant Orders   Lipid Profile (Completed)   Pre-diabetes       Relevant Orders   HgB A1c (Completed)        I am having Franceska M. Micheli maintain her diclofenac Sodium, meloxicam, cabotegravir & rilpivirine ER, ondansetron, and benzonatate.   No orders of the defined types were placed   in this encounter.    Follow-up: Return in about 1 month (around 08/13/2022), or if symptoms worsen or fail to improve.   Terri Piedra, MSN, FNP-C Nurse Practitioner [facility] [place] number: [phone]

## 2022-07-13 NOTE — Assessment & Plan Note (Signed)
Tiffany Ray has well controlled virus with good adherence and tolerance to [place]. Due for next injection in one month. Check lab work today. Plan for follow up in 1 month or sooner if needed for next injection.

## 2022-07-13 NOTE — Assessment & Plan Note (Signed)
Previous A1c of 6.4 and will recheck A1c today. Enouraged continue lifestyle management with nutrition and physical activity. If increased may need to consider metformin.

## 2022-07-13 NOTE — Patient Instructions (Signed)
Nice to see you.  We will check your lab work today.  Continue to take your medication daily as prescribed.  We will get you referred to Orthopedics or Sports Medicine for your knee.  Plan for follow up in 1 month or sooner if needed as scheduled.   Have a great day and stay safe!

## 2022-07-14 LAB — T-HELPER CELL (CD4) - (RCID CLINIC ONLY)
CD4 % Helper T Cell: 41 % (ref 33–65)
CD4 T Cell Abs: 758 /uL (ref 400–1790)

## 2022-07-17 LAB — LIPID PANEL
Cholesterol: 238 mg/dL — ABNORMAL HIGH (ref <200)
HDL: 63 mg/dL (ref >=50)
LDL Cholesterol (Calc): 145 mg/dL (calc) — ABNORMAL HIGH
Non-HDL Cholesterol (Calc): 175 mg/dL (calc) — ABNORMAL HIGH (ref <130)
Total CHOL/HDL Ratio: 3.8 (calc) (ref <5.0)
Triglycerides: 165 mg/dL — ABNORMAL HIGH (ref <150)

## 2022-07-17 LAB — BASIC METABOLIC PANEL WITH GFR
BUN: 12 mg/dL (ref 7–25)
CO2: 30 mmol/L (ref 20–32)
Calcium: 9.8 mg/dL (ref 8.6–10.2)
Chloride: 105 mmol/L (ref 98–110)
Creat: 0.82 mg/dL (ref 0.50–0.99)
Glucose, Bld: 93 mg/dL (ref 65–99)
Potassium: 4.1 mmol/L (ref 3.5–5.3)
Sodium: 140 mmol/L (ref 135–146)
eGFR: 89 mL/min/{1.73_m2} (ref >=60)

## 2022-07-17 LAB — HEMOGLOBIN A1C
Hgb A1c MFr Bld: 6.8 % of total Hgb — ABNORMAL HIGH (ref <5.7)
Mean Plasma Glucose: 148 mg/dL
eAG (mmol/L): 8.2 mmol/L

## 2022-07-17 LAB — HIV-1 RNA QUANT-NO REFLEX-BLD
HIV 1 RNA Quant: NOT DETECTED Copies/mL
HIV-1 RNA Quant, Log: NOT DETECTED Log cps/mL

## 2022-07-21 ENCOUNTER — Other Ambulatory Visit (HOSPITAL_COMMUNITY): Payer: Self-pay

## 2022-07-25 NOTE — Progress Notes (Deleted)
   I, [name], LAT, ATC acting as a scribe for [name], MD.  Subjective:    CC: R knee pain  HPI: Pt is a 47 y/o female c/o R knee pain  x /. Pt locates pain to   R Knee swelling: Mechanical symptoms: yes Aggravates: worse at night Treatments tried: Meloxicam, Voltaren gel, BioFreeze  Pertinent review of Systems: ***  Relevant historical information: ***   Objective:   There were no vitals filed for this visit. General: Well Developed, well nourished, and in no acute distress.   MSK: ***  Lab and Radiology Results No results found for this or any previous visit (from the past 72 hour(s)). No results found.    Impression and Recommendations:    Assessment and Plan: 47 y.o. female with ***.  PDMP not reviewed this encounter. No orders of the defined types were placed in this encounter.  No orders of the defined types were placed in this encounter.   Discussed warning signs or symptoms. Please see discharge instructions. Patient expresses understanding.   ***

## 2022-07-27 ENCOUNTER — Ambulatory Visit: Payer: Medicaid Other | Admitting: Family Medicine

## 2022-07-27 DIAGNOSIS — G8929 Other chronic pain: Secondary | ICD-10-CM

## 2022-08-01 ENCOUNTER — Other Ambulatory Visit (HOSPITAL_COMMUNITY): Payer: Self-pay

## 2022-08-02 ENCOUNTER — Telehealth: Payer: Self-pay

## 2022-08-02 NOTE — Telephone Encounter (Signed)
[facility] Patient Advocate Encounter  Patient's medications have been couriered to [facility] from [place]: [phone] , and will be administered on  08/09/2021.

## 2022-08-03 NOTE — Progress Notes (Deleted)
    Subjective:    CC: CC: R knee pain  HPI: Pt is a 47 y/o female c/o R knee pain  x /. Pt locates pain to   R Knee swelling: Mechanical symptoms: yes Aggravates: worse at night Treatments tried: Meloxicam, Voltaren gel, BioFreeze   Pertinent review of Systems: ***  Relevant historical information: ***   Objective:   There were no vitals filed for this visit. General: Well Developed, well nourished, and in no acute distress.   MSK: ***  Lab and Radiology Results No results found for this or any previous visit (from the past 72 hour(s)). No results found.    Impression and Recommendations:    Assessment and Plan: 47 y.o. female with ***.  PDMP not reviewed this encounter. No orders of the defined types were placed in this encounter.  No orders of the defined types were placed in this encounter.   Discussed warning signs or symptoms. Please see discharge instructions. Patient expresses understanding.   ***

## 2022-08-04 ENCOUNTER — Ambulatory Visit: Payer: Medicaid Other | Admitting: Family Medicine

## 2022-08-09 ENCOUNTER — Ambulatory Visit: Payer: Medicaid Other | Admitting: Family

## 2022-08-10 ENCOUNTER — Other Ambulatory Visit: Payer: Self-pay

## 2022-08-10 ENCOUNTER — Ambulatory Visit (INDEPENDENT_AMBULATORY_CARE_PROVIDER_SITE_OTHER): Payer: Medicaid Other | Admitting: Family

## 2022-08-10 VITALS — BP 139/101 | HR 81 | Temp 98.2°F | Wt 274.0 lb

## 2022-08-10 DIAGNOSIS — Z0000 Encounter for general adult medical examination without abnormal findings: Secondary | ICD-10-CM

## 2022-08-10 DIAGNOSIS — B20 Human immunodeficiency virus [HIV] disease: Secondary | ICD-10-CM

## 2022-08-10 DIAGNOSIS — E119 Type 2 diabetes mellitus without complications: Secondary | ICD-10-CM | POA: Diagnosis not present

## 2022-08-10 MED ORDER — CABOTEGRAVIR & RILPIVIRINE ER 600 & 900 MG/3ML IM SUER
1.0000 | Freq: Once | INTRAMUSCULAR | Status: AC
  Administered 2022-08-10: 1 via INTRAMUSCULAR

## 2022-08-10 NOTE — Patient Instructions (Signed)
Nice to see you.  Plan for follow up in 2 months or sooner if needed with lab work on the same day with pharmacy and 4 months with [name].   Have a great day and stay safe!

## 2022-08-10 NOTE — Progress Notes (Signed)
Brief Narrative   Patient ID: Tiffany Ray, female    DOB: January 29, 1976, 47 y.o.   MRN: [id number]  Tiffany Ray is a 47 y/o AA female diagnosed with HIV in 2009/10 with risk factor of heterosexual contact. Initial viral load and CD4 count are unknown. No history of opportunistic infection. Initial clinic lab work with viral load of 214,000 and CD4 count of 380. Genotype with V106M.  [id number] negative. Previous ART regimen of Atripla.     Subjective:    Chief Complaint  Patient presents with   Follow-up    HPI:  Tiffany Ray is a 47 y.o. female with HIV disease last seen on 07/13/22 with well controlled virus and good adherence and tolerance to [place]. Lab work with viral load that was undetectable and CD4 count 758. Renal function and electrolytes within normal ranges. Hemoglobin A1c has increased to 6.8 indicating Type 2 diabetes. Lipid profile with HDL 63, LDL 145, and triglycerides 165 (ASCVD risk 7.9% - intermediate). Here today for next Cabenuva injection.   Tiffany Ray is doing okay. She is hurried as she has to get things done before going out of town. Tolerating Cabenuva with no adverse side effects. Continues to smoke tobacco daily. Nutritionally consuming significant amount of processed/fast food with usual meal being chicken nuggets and ranch along with french fries and Dr. Malachi Bonds. Has no new concerns/complaints. Condoms and STD testing offered.   Denies fevers, chills, night sweats, headaches, changes in vision, neck pain/stiffness, nausea, diarrhea, vomiting, lesions or rashes.    No Known Allergies    Outpatient Medications Prior to Visit  Medication Sig Dispense Refill   cabotegravir & rilpivirine ER (CABENUVA) 600 & 900 MG/3ML injection Inject 1 kit into the muscle every 2 (two) months. 6 mL 5   benzonatate (TESSALON) 100 MG capsule Take 1 capsule (100 mg total) by mouth 3 (three) times daily as needed for cough. (Patient not taking: Reported on 07/13/2022) 15  capsule 0   diclofenac Sodium (VOLTAREN) 1 % GEL Apply 2 g topically 4 (four) times daily. (Patient not taking: Reported on 07/13/2022) 50 g 0   meloxicam (MOBIC) 15 MG tablet TAKE 1 TABLET BY MOUTH EVERY DAY (Patient not taking: Reported on 07/13/2022) 30 tablet 0   ondansetron (ZOFRAN-ODT) 4 MG disintegrating tablet Take 1 tablet (4 mg total) by mouth every 8 (eight) hours as needed for nausea or vomiting. (Patient not taking: Reported on 07/13/2022) 10 tablet 0   No facility-administered medications prior to visit.     Past Medical History:  Diagnosis Date   Abnormal uterine bleeding (AUB)    Adnexal mass    Anemia    HIV infection (HCC)    Pneumonia    Pre-diabetes    Prediabetes    Type 2 diabetes mellitus ([place]) 10/06/2019     Past Surgical History:  Procedure Laterality Date   ECTOPIC PREGNANCY SURGERY     ROBOTIC ASSISTED SALPINGO OOPHERECTOMY Bilateral 07/08/2020   Procedure: XI ROBOTIC ASSISTED LEFT SALPINGO OOPHORECTOMY, RIGHT SALPINGECTOMY;  Surgeon: Lafonda Mosses, MD;  Location: [facility];  Service: Gynecology;  Laterality: Bilateral;   TUBAL LIGATION        Review of Systems  Constitutional:  Negative for appetite change, chills, diaphoresis, fatigue, fever and unexpected weight change.  Eyes:        Negative for acute change in vision  Respiratory:  Negative for chest tightness, shortness of breath and wheezing.   Cardiovascular:  Negative for chest  pain, palpitations and leg swelling.  Gastrointestinal:  Negative for diarrhea, nausea and vomiting.  Endocrine: Negative for polydipsia, polyphagia and polyuria.  Genitourinary:  Negative for dysuria, pelvic pain and vaginal discharge.  Musculoskeletal:  Negative for neck pain and neck stiffness.  Skin:  Negative for rash.  Neurological:  Negative for seizures, syncope, weakness, numbness and headaches.  Hematological:  Negative for adenopathy. Does not bruise/bleed easily.   Psychiatric/Behavioral:  Negative for hallucinations.       Objective:    BP (!) 139/101   Pulse 81   Temp 98.2 F (36.8 C) (Oral)   Wt 274 lb (124.3 kg)   LMP 06/05/2020   SpO2 100%   BMI 39.31 kg/m  Nursing note and vital signs reviewed.  Physical Exam Constitutional:      General: She is not in acute distress.    Appearance: She is well-developed. She is obese.  Eyes:     Conjunctiva/sclera: Conjunctivae normal.  Cardiovascular:     Rate and Rhythm: Normal rate and regular rhythm.     Heart sounds: Normal heart sounds. No murmur heard.    No friction rub. No gallop.  Pulmonary:     Effort: Pulmonary effort is normal. No respiratory distress.     Breath sounds: Normal breath sounds. No wheezing or rales.  Chest:     Chest wall: No tenderness.  Abdominal:     General: Bowel sounds are normal.     Palpations: Abdomen is soft.     Tenderness: There is no abdominal tenderness.  Musculoskeletal:     Cervical back: Neck supple.  Lymphadenopathy:     Cervical: No cervical adenopathy.  Skin:    General: Skin is warm and dry.     Findings: No rash.  Neurological:     Mental Status: She is alert and oriented to person, place, and time.  Psychiatric:        Behavior: Behavior normal.        Thought Content: Thought content normal.        Judgment: Judgment normal.         07/13/2022    3:03 PM 10/03/2021    1:38 PM 03/29/2021   11:29 AM 09/15/2020    2:11 PM 05/13/2019    3:41 PM  Depression screen PHQ 2/9  Decreased Interest 0 0 0 0 0  Down, Depressed, Hopeless 0 0 0 0 0  PHQ - 2 Score 0 0 0 0 0       Assessment & Plan:    Patient Active Problem List   Diagnosis Date Noted   Acute pain of right knee 01/27/2022   BMI 38.0-38.9,adult 10/03/2021   Numbness and tingling 09/15/2020   Post-op pain 07/30/2020   Other constipation 07/30/2020   Endometriosis    Pelvic mass in female 02/23/2020   Pelvic pain in female 02/23/2020   Menorrhagia with irregular  cycle 11/04/2019   Screening for cervical cancer 11/04/2019   Type 2 diabetes mellitus ([place]) 10/06/2019   HIV disease ([place]) 09/13/2018   Healthcare maintenance 09/13/2018     Problem List Items Addressed This Visit       Endocrine   Type 2 diabetes mellitus ([place])    [place]'s A1c has progressed and now is 6.8 indicating Type 2 diabetes. Discussed lifestyle management versus medication and would like to try lifestyle management first. Educated regarding importance of checking her feet and the new need for eye exams. No numbness/tingling. Declines diabetes nutrition education. Plan to recheck  A1c in 3 months.         Other   HIV disease ([place]) - Primary    Ch[place]am needs to have well-controlled virus with good adherence and tolerance to [place]. Reviewed lab work and discussed plan of care. Cabenuva injection provided with no complications. Continue current dose of q 2 month Cabenuva.       Healthcare maintenance    Discussed importance of safe sexual practice and condom use. Condoms and STD testing offered.  Due for colonoscopy for colon cancer screening.  Breast cancer and cervical cancer screening are up to date.         I am having Vidal Schwalbe. Pastrana maintain her diclofenac Sodium, meloxicam, cabotegravir & rilpivirine ER, ondansetron, and benzonatate. We administered cabotegravir & rilpivirine ER.   Follow-up: 3 months or sooner if needed .    Terri Piedra, MSN, FNP-C Nurse Practitioner [facility] for Infectious Disease [place] number: [phone]

## 2022-08-11 ENCOUNTER — Encounter: Payer: Self-pay | Admitting: Family

## 2022-08-11 NOTE — Assessment & Plan Note (Signed)
Kassey needs to have well-controlled virus with good adherence and tolerance to [place]. Reviewed lab work and discussed plan of care. Cabenuva injection provided with no complications. Continue current dose of q 2 month Cabenuva.

## 2022-08-11 NOTE — Assessment & Plan Note (Signed)
Tiffany Ray's A1c has progressed and now is 6.8 indicating Type 2 diabetes. Discussed lifestyle management versus medication and would like to try lifestyle management first. Educated regarding importance of checking her feet and the new need for eye exams. No numbness/tingling. Declines diabetes nutrition education. Plan to recheck A1c in 3 months.

## 2022-08-11 NOTE — Assessment & Plan Note (Signed)
Discussed importance of safe sexual practice and condom use. Condoms and STD testing offered.  Due for colonoscopy for colon cancer screening.  Breast cancer and cervical cancer screening are up to date.

## 2022-08-17 NOTE — Progress Notes (Signed)
I, Tiffany Ray, LAT, ATC acting as a scribe for Tiffany Leader, MD.  Subjective:    CC: R knee pain  HPI: Pt is a 47 y/o female c/o R knee pain over a year, worse over the last couple month. 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 Pt locates pain to the anterior aspect of the R knee w/ radiating pain into the lower leg and foot.   R Knee swelling: yes Mechanical symptoms: yes Aggravates: worse at night, being on her feet, walking, stairs Treatments tried: Meloxicam, Voltaren gel, BioFreeze  Pertinent review of Systems: No fevers or chills  Relevant historical information: Diabetes.  Controlled HIV.   Objective:    Vitals:   08/18/22 1029  BP: 122/82   General: Well Developed, well nourished, and in no acute distress.   MSK: Right knee: Moderate effusion otherwise normal-appearing Normal motion pain with extension and full flexion. Stable ligamentous exam. Intact strength. Positive medial McMurray's test.  Lab and Radiology Results  Procedure: Real-time Ultrasound Guided aspiration and injection of right knee superior lateral patellar space Device: Philips Affiniti 50G Images permanently stored and available for review in PACS Ultrasound prior to aspiration and injection reveals a moderate joint effusion with degenerative appearing medial and lateral joint lines. Verbal informed consent obtained.  Discussed risks and benefits of procedure. Warned about infection, bleeding, hyperglycemia damage to structures among others. Patient expresses understanding and agreement Time-out conducted.   Noted no overlying erythema, induration, or other signs of local infection.   Skin prepped in a sterile fashion.   Local anesthesia: Topical Ethyl chloride.   With sterile technique and under real time ultrasound guidance: 2.5 mL of lidocaine injected into subcutaneous tissue achieving good anesthesia.   Then was again sterilized with isopropyl alcohol. 18-gauge needle used to access the knee joint and 30 mL of clear to cloudy straw-colored fluid aspirated. Syringe was exchanged and 40 mg of Kenalog and 2 mL of Marcaine were injected into the knee joint Completed without difficulty   Pain immediately resolved suggesting accurate placement of the medication.   Advised to call if fevers/chills, erythema, induration, drainage, or persistent bleeding.   Images permanently stored and available for review in the ultrasound unit.  Impression: Technically successful ultrasound guided injection.   X-ray images right knee obtained today personally and independently interpreted Mild DJD lateral joint line and patellofemoral joint.  No acute fractures. Await formal radiology review     Impression and Recommendations:    Assessment and Plan: 47 y.o. female with right knee pain thought to be due to DJD.  Plan for aspiration and injection.  Will send fluid for culture cell count differential and crystal analysis as well.  Steroid injection should be helpful.  Addition recommend Voltaren gel.  Check back in about 6 weeks.  She does not have a primary care provider.  She is receiving primary care services from her ID provider.  Given her diabetes and other medical problems would benefit from a PCP.  Recommended some PCP options for her.  PDMP not reviewed this encounter. Orders Placed This Encounter  Procedures   Anaerobic and Aerobic Culture    Synovial fluid    Standing Status:   Future    Number of Occurrences:   1    Standing Expiration Date:   08/19/2023   [place] LIMITED JOINT SPACE STRUCTURES LOW RIGHT(NO LINKED CHARGES)  Order Specific Question:   Reason for Exam (SYMPTOM  OR DIAGNOSIS REQUIRED)    Answer:   right knee pain    Order Specific Question:   Preferred imaging location?    Answer:   [facility]   DG Knee AP/LAT W/Sun[place] Right    Standing Status:   Future     Number of Occurrences:   1    Standing Expiration Date:   09/16/2022    Order Specific Question:   Reason for Exam (SYMPTOM  OR DIAGNOSIS REQUIRED)    Answer:   right knee pain    Order Specific Question:   Preferred imaging location?    Answer:   [facility]    Order Specific Question:   Is patient pregnant?    Answer:   No   Synovial Fluid Analysis, Complete    Synovial fluid    Standing Status:   Future    Number of Occurrences:   1    Standing Expiration Date:   08/19/2023   No orders of the defined types were placed in this encounter.   Discussed warning signs or symptoms. Please see discharge instructions. Patient expresses understanding.   The above documentation has been reviewed and is accurate and complete Tiffany Ray, M.D.

## 2022-08-18 ENCOUNTER — Ambulatory Visit (INDEPENDENT_AMBULATORY_CARE_PROVIDER_SITE_OTHER): Payer: Medicaid Other | Admitting: Family Medicine

## 2022-08-18 ENCOUNTER — Ambulatory Visit (INDEPENDENT_AMBULATORY_CARE_PROVIDER_SITE_OTHER): Payer: Medicaid Other

## 2022-08-18 ENCOUNTER — Ambulatory Visit: Payer: Self-pay

## 2022-08-18 VITALS — BP 122/82 | Ht 70.0 in | Wt 278.0 lb

## 2022-08-18 DIAGNOSIS — G8929 Other chronic pain: Secondary | ICD-10-CM | POA: Diagnosis not present

## 2022-08-18 DIAGNOSIS — M25561 Pain in right knee: Secondary | ICD-10-CM

## 2022-08-18 NOTE — Patient Instructions (Addendum)
Thank you for coming in today.   Please get an Xray today before you leave   You received an injection today. Seek immediate medical attention if the joint becomes red, extremely painful, or is oozing fluid.   Please use Voltaren gel (Generic Diclofenac Gel) up to 4x daily for pain as needed.  This is available over-the-counter as both the name brand Voltaren gel and the generic diclofenac gel.   Recheck in about 6 weeks.

## 2022-08-21 NOTE — Progress Notes (Signed)
Right knee x-ray shows a little bit of arthritis

## 2022-08-21 NOTE — Progress Notes (Signed)
Knee fluid showed no gout crystals or signs of infection.  The culture part of the knee aspiration is still pending.

## 2022-08-24 LAB — SYNOVIAL FLUID ANALYSIS, COMPLETE
Basophils, %: 0 %
Eosinophils-Synovial: 0 % (ref 0–2)
Lymphocytes-Synovial Fld: 49 % (ref 0–74)
Monocyte/Macrophage: 38 % (ref 0–69)
Neutrophil, Synovial: 9 % (ref 0–24)
Synoviocytes, %: 4 % (ref 0–15)
WBC, Synovial: 846 cells/uL — ABNORMAL HIGH (ref <150)

## 2022-08-24 LAB — ANAEROBIC AND AEROBIC CULTURE
AER RESULT:: NO GROWTH
MICRO NUMBER:: 14511403
MICRO NUMBER:: 14511404
SPECIMEN QUALITY:: ADEQUATE
SPECIMEN QUALITY:: ADEQUATE

## 2022-09-11 ENCOUNTER — Ambulatory Visit: Payer: Medicaid Other | Admitting: Physician Assistant

## 2022-09-13 ENCOUNTER — Encounter: Payer: Self-pay | Admitting: Obstetrics and Gynecology

## 2022-09-13 ENCOUNTER — Encounter: Payer: Medicaid Other | Admitting: Obstetrics and Gynecology

## 2022-09-14 NOTE — Progress Notes (Signed)
Patient did not keep her new patient GYN appointment for 09/13/2022.  Durene Romans MD Attending Center for [employer] Fish farm manager)

## 2022-09-21 ENCOUNTER — Other Ambulatory Visit (HOSPITAL_COMMUNITY): Payer: Self-pay

## 2022-09-22 ENCOUNTER — Telehealth: Payer: Self-pay

## 2022-09-22 NOTE — Telephone Encounter (Signed)
[facility] Patient Advocate Encounter  Patient's medication Kern Reap) have been couriered to [facility] from [facility] and will be administered on the patient next office visit on 09/27/22.  Ileene Patrick , [facility] Patient [facility] for Infectious Disease Phone: [phone] Fax:  [phone]

## 2022-09-27 ENCOUNTER — Encounter: Payer: Medicaid Other | Admitting: Pharmacist

## 2022-09-27 ENCOUNTER — Ambulatory Visit (INDEPENDENT_AMBULATORY_CARE_PROVIDER_SITE_OTHER): Payer: Medicaid Other

## 2022-09-27 ENCOUNTER — Other Ambulatory Visit: Payer: Self-pay

## 2022-09-27 ENCOUNTER — Other Ambulatory Visit (HOSPITAL_COMMUNITY)
Admission: RE | Admit: 2022-09-27 | Discharge: 2022-09-27 | Disposition: A | Discharge status: Home / Self Care | Payer: Medicaid Other | Source: Ambulatory Visit | Attending: Family | Admitting: Family

## 2022-09-27 ENCOUNTER — Ambulatory Visit (INDEPENDENT_AMBULATORY_CARE_PROVIDER_SITE_OTHER): Payer: Medicaid Other | Admitting: Pharmacist

## 2022-09-27 DIAGNOSIS — Z113 Encounter for screening for infections with a predominantly sexual mode of transmission: Secondary | ICD-10-CM

## 2022-09-27 DIAGNOSIS — Z23 Encounter for immunization: Secondary | ICD-10-CM | POA: Diagnosis present

## 2022-09-27 DIAGNOSIS — B20 Human immunodeficiency virus [HIV] disease: Secondary | ICD-10-CM

## 2022-09-27 MED ORDER — CABOTEGRAVIR & RILPIVIRINE ER 600 & 900 MG/3ML IM SUER
1.0000 | Freq: Once | INTRAMUSCULAR | Status: AC
  Administered 2022-09-27: 1 via INTRAMUSCULAR

## 2022-09-27 NOTE — Progress Notes (Signed)
HPI: Tiffany Ray is a 47 y.o. female who presents to the [place] clinic for [place] administration.  Patient Active Problem List   Diagnosis Date Noted   Acute pain of right knee 01/27/2022   BMI 38.0-38.9,adult 10/03/2021   Numbness and tingling 09/15/2020   Post-op pain 07/30/2020   Other constipation 07/30/2020   Endometriosis    Pelvic mass in female 02/23/2020   Pelvic pain in female 02/23/2020   Menorrhagia with irregular cycle 11/04/2019   Screening for cervical cancer 11/04/2019   Type 2 diabetes mellitus ([place]) 10/06/2019   HIV disease ([place]) 09/13/2018   Healthcare maintenance 09/13/2018    Patient's Medications  New Prescriptions   No medications on file  Previous Medications   CABOTEGRAVIR & RILPIVIRINE ER (CABENUVA) 600 & 900 MG/3ML INJECTION    Inject 1 kit into the muscle every 2 (two) months.   DICLOFENAC SODIUM (VOLTAREN) 1 % GEL    Apply 2 g topically 4 (four) times daily.   MELOXICAM (MOBIC) 15 MG TABLET    TAKE 1 TABLET BY MOUTH EVERY DAY  Modified Medications   No medications on file  Discontinued Medications   No medications on file    Allergies: No Known Allergies  Past Medical History: Past Medical History:  Diagnosis Date   Abnormal uterine bleeding (AUB)    Adnexal mass    Anemia    HIV infection (HCC)    Pneumonia    Pre-diabetes    Prediabetes    Type 2 diabetes mellitus ([place]) 10/06/2019    Social History: Social History   Socioeconomic History   Marital status: Single    Spouse name: Not on file   Number of children: 4   Years of education: 12   Highest education level: Not on file  Occupational History   Not on file  Tobacco Use   Smoking status: Every Day    Packs/day: 0.25    Types: Cigarettes   Smokeless tobacco: Never   Tobacco comments:    States back and forth with quitting  Vaping Use   Vaping Use: Never used  Substance and Sexual Activity   Alcohol use: Never   Drug use: Never   Sexual activity:  Not on file    Comment: declined condoms  Other Topics Concern   Not on file  Social History Narrative   Not on file   Social Determinants of Health   Financial Resource Strain: Not on file  Food Insecurity: Not on file  Transportation Needs: Not on file  Physical Activity: Not on file  Stress: Not on file  Social Connections: Not on file    Labs: Lab Results  Component Value Date   HIV1RNAQUANT Not Detected 07/13/2022   HIV1RNAQUANT Not Detected 06/15/2022   HIV1RNAQUANT Not Detected 04/12/2022   CD4TABS 758 07/13/2022   CD4TABS 760 10/03/2021   CD4TABS 699 09/15/2020    RPR and STI Lab Results  Component Value Date   LABRPR NON-REACTIVE 09/15/2020   LABRPR NON-REACTIVE 05/13/2019   LABRPR NON-REACTIVE 08/27/2018    STI Results GC CT  09/23/2019 12:27 PM Negative  Negative     Hepatitis B Lab Results  Component Value Date   HEPBSAB REACTIVE (A) 06/15/2022   HEPBSAG NON-REACTIVE 08/27/2018   HEPBCAB NON-REACTIVE 08/27/2018   Hepatitis C Lab Results  Component Value Date   HEPCAB NON-REACTIVE 08/27/2018   Hepatitis A Lab Results  Component Value Date   HAV NON-REACTIVE 08/27/2018   Lipids: Lab Results  Component Value Date   CHOL 238 (H) 07/13/2022   TRIG 165 (H) 07/13/2022   HDL 63 07/13/2022   CHOLHDL 3.8 07/13/2022   LDLCALC 145 (H) 07/13/2022    TARGET DATE: The 20th  Assessment: Tiffany Ray presents today for her maintenance Cabenuva injections. Past injections were tolerated well without issues. Asked if patient would like STI testing today and she desires full STI testing with RPR and urine/oral/rectal cytologies. Will get an HIV RNA today as this was last collected in 06/2022. Will also get HepA titers as she completed the HepA vaccine series in 05/2022. Asked the patient if she would be interested in receiving the updated COVID bivalent vaccine today and she was agreeable.   Administered cabotegravir '600mg'$ /14m in left upper outer quadrant of  the gluteal muscle. Administered rilpivirine 900 mg/387min the right upper outer quadrant of the gluteal muscle. No issues with injections. She will follow up in 2 months for next set of injections.  Plan: - Cabenuva injections administered - Give COVID bivalent vaccine  - Get HIV RNA, RPR, urine/oral/rectal cytologies, and HepA titer  - Next injections scheduled for 11/27/22 with GrMarya Amslernd 01/30/23 with AmEstill Bamberg Call with any issues or questions   SyBilley GoslingPharmD PGY1 Pharmacy Resident 3/13/20241:17 PM

## 2022-09-28 LAB — CYTOLOGY, (ORAL, ANAL, URETHRAL) ANCILLARY ONLY
Chlamydia: NEGATIVE
Chlamydia: NEGATIVE
Comment: NEGATIVE
Comment: NEGATIVE
Comment: NORMAL
Comment: NORMAL
Neisseria Gonorrhea: NEGATIVE
Neisseria Gonorrhea: NEGATIVE

## 2022-09-28 LAB — RPR: RPR Ser Ql: NONREACTIVE

## 2022-09-28 LAB — URINE CYTOLOGY ANCILLARY ONLY
Chlamydia: NEGATIVE
Comment: NEGATIVE
Comment: NORMAL
Neisseria Gonorrhea: NEGATIVE

## 2022-09-29 ENCOUNTER — Ambulatory Visit: Payer: Medicaid Other | Admitting: Family Medicine

## 2022-09-29 LAB — HIV-1 RNA QUANT-NO REFLEX-BLD
HIV 1 RNA Quant: 31 Copies/mL — ABNORMAL HIGH
HIV-1 RNA Quant, Log: 1.49 Log cps/mL — ABNORMAL HIGH

## 2022-09-29 LAB — HEPATITIS A ANTIBODY, TOTAL: Hepatitis A AB,Total: REACTIVE — AB

## 2022-10-02 ENCOUNTER — Ambulatory Visit (INDEPENDENT_AMBULATORY_CARE_PROVIDER_SITE_OTHER): Payer: Medicaid Other | Admitting: Family Medicine

## 2022-10-02 VITALS — BP 134/82 | Ht 70.0 in | Wt 278.0 lb

## 2022-10-02 DIAGNOSIS — M25561 Pain in right knee: Secondary | ICD-10-CM

## 2022-10-02 DIAGNOSIS — G8929 Other chronic pain: Secondary | ICD-10-CM

## 2022-10-02 NOTE — Patient Instructions (Addendum)
We will check your insurance benefits for Zilretta injection and Visco injection.   Referral placed for Physical Therapy at [place].  MRI R knee ordered

## 2022-10-02 NOTE — Progress Notes (Unsigned)
   Shirlyn Goltz, PhD, LAT, ATC acting as a scribe for Tiffany Leader, MD.  Tiffany Ray is a 47 y.o. female who presents to [place] at [facility] today for follow-up right knee pain.  Patient was last seen by Dr. Georgina Snell on 08/18/2022 and her right knee was aspirated and injected with a steroid.  Patient was also advised to use Voltaren gel.  Today, patient reports that her knee is still in pain , thinks she has some swelling as well   Dx testing: 08/18/2022 R knee XR and culture  Pertinent review of systems: No fevers or chills  Relevant historical information: Diabetes and controlled HIV.   Exam:  BP 134/82   Ht 5\' 10"  (1.778 m)   Wt 278 lb (126.1 kg)   LMP 06/05/2020   BMI 39.89 kg/m  General: Well Developed, well nourished, and in no acute distress.   MSK: Right knee: Mild effusion. Tender palpation medial joint line. Stable ligamentous exam. Intact strength. Positive Murray's test.   Lab and Radiology Results   EXAM: RIGHT KNEE 3 VIEWS   COMPARISON:  None Available.   FINDINGS: There is mild-to-moderate patella alta. Small joint effusion. Joint spaces are preserved. Mild peripheral patellar degenerative osteophytosis, greatest inferiorly. No acute fracture or dislocation.   IMPRESSION: 1. 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 2. Small joint effusion. 3. Mild patellofemoral osteoarthritis.     Electronically Signed   By: Yvonne Kendall M.D.   On: 08/18/2022 13:59 I, Tiffany Ray, personally (independently) visualized and performed the interpretation of the images attached in this note.     Assessment and Plan: 47 y.o. female with right knee pain and effusion.  Etiology is unclear.  She has much more pain and swelling than I would expect based on her x-ray.  Steroid injection did not provide lasting benefit.  The only abnormality that I can see is patella alta which should not explain her pain sufficiently. Plan for MRI of the knee to further  evaluate source of pain and swelling.  Anticipate that physical therapy may be indicated.  Will go ahead and get that process started but the MRI is for surgical or injection planning.   PDMP not reviewed this encounter. Orders Placed This Encounter  Procedures   MR KNEE RIGHT WO CONTRAST    Standing Status:   Future    Standing Expiration Date:   11/02/2022    Order Specific Question:   What is the patient's sedation requirement?    Answer:   No Sedation    Order Specific Question:   Does the patient have a pacemaker or implanted devices?    Answer:   No    Order Specific Question:   Preferred imaging location?    Answer:   Product/process development scientist (table limit-350lbs)   Ambulatory referral to Physical Therapy    Referral Priority:   Routine    Referral Type:   Physical Medicine    Referral Reason:   Specialty Services Required    Requested Specialty:   Physical Therapy    Number of Visits Requested:   1   No orders of the defined types were placed in this encounter.    Discussed warning signs or symptoms. Please see discharge instructions. Patient expresses understanding.   The above documentation has been reviewed and is accurate and complete Tiffany Ray, M.D.

## 2022-10-02 NOTE — Progress Notes (Signed)
Slight increase in viral load from previous lab draws. Still <50. Will monitor closely at next follow-up appointments. Estill Bamberg

## 2022-11-13 DIAGNOSIS — J029 Acute pharyngitis, unspecified: Secondary | ICD-10-CM | POA: Diagnosis not present

## 2022-11-16 ENCOUNTER — Other Ambulatory Visit (HOSPITAL_COMMUNITY): Payer: Self-pay

## 2022-11-20 ENCOUNTER — Other Ambulatory Visit: Payer: Self-pay

## 2022-11-21 ENCOUNTER — Telehealth: Payer: Self-pay

## 2022-11-21 NOTE — Telephone Encounter (Signed)
[facility] Patient Advocate Encounter  Patient's medication Tiffany Ray) have been couriered to [facility] from [employer] [employer] [facility] and will be administered on the patient next office visit on 11/27/22.  [name] , [name] Specialty Pharmacy Patient [facility] for Infectious Disease Phone: [phone] Fax:  [phone]

## 2022-11-27 ENCOUNTER — Ambulatory Visit: Payer: Medicaid Other | Admitting: Family

## 2022-11-28 ENCOUNTER — Ambulatory Visit (INDEPENDENT_AMBULATORY_CARE_PROVIDER_SITE_OTHER): Payer: Medicaid Other | Admitting: Pharmacist

## 2022-11-28 ENCOUNTER — Other Ambulatory Visit: Payer: Self-pay

## 2022-11-28 DIAGNOSIS — B20 Human immunodeficiency virus [HIV] disease: Secondary | ICD-10-CM

## 2022-11-28 DIAGNOSIS — Z113 Encounter for screening for infections with a predominantly sexual mode of transmission: Secondary | ICD-10-CM

## 2022-11-28 MED ORDER — CABOTEGRAVIR & RILPIVIRINE ER 600 & 900 MG/3ML IM SUER
1.0000 | Freq: Once | INTRAMUSCULAR | Status: AC
  Administered 2022-11-28: 1 via INTRAMUSCULAR

## 2022-11-28 NOTE — Progress Notes (Signed)
HPI: Tiffany Ray is a 47 y.o. female who presents to the [facility] pharmacy clinic for [place] administration.  Patient Active Problem List   Diagnosis Date Noted   Acute pain of right knee 01/27/2022   BMI 38.0-38.9,adult 10/03/2021   Numbness and tingling 09/15/2020   Post-op pain 07/30/2020   Other constipation 07/30/2020   Endometriosis    Pelvic mass in female 02/23/2020   Pelvic pain in female 02/23/2020   Menorrhagia with irregular cycle 11/04/2019   Screening for cervical cancer 11/04/2019   Type 2 diabetes mellitus (HCC) 10/06/2019   HIV disease (HCC) 09/13/2018   Healthcare maintenance 09/13/2018    Patient's Medications  New Prescriptions   No medications on file  Previous Medications   CABOTEGRAVIR & RILPIVIRINE ER (CABENUVA) 600 & 900 MG/3ML INJECTION    Inject 1 kit into the muscle every 2 (two) months.   DICLOFENAC SODIUM (VOLTAREN) 1 % GEL    Apply 2 g topically 4 (four) times daily.   MELOXICAM (MOBIC) 15 MG TABLET    TAKE 1 TABLET BY MOUTH EVERY DAY  Modified Medications   No medications on file  Discontinued Medications   No medications on file    Allergies: No Known Allergies  Past Medical History: Past Medical History:  Diagnosis Date   Abnormal uterine bleeding (AUB)    Adnexal mass    Anemia    HIV infection (HCC)    Pneumonia    Pre-diabetes    Prediabetes    Type 2 diabetes mellitus (HCC) 10/06/2019    Social History: Social History   Socioeconomic History   Marital status: Single    Spouse name: Not on file   Number of children: 4   Years of education: 12   Highest education level: Not on file  Occupational History   Not on file  Tobacco Use   Smoking status: Every Day    Packs/day: .25    Types: Cigarettes   Smokeless tobacco: Never   Tobacco comments:    States back and forth with quitting  Vaping Use   Vaping Use: Never used  Substance and Sexual Activity   Alcohol use: Never   Drug use: Never   Sexual activity:  Not on file    Comment: declined condoms  Other Topics Concern   Not on file  Social History Narrative   Not on file   Social Determinants of Health   Financial Resource Strain: Not on file  Food Insecurity: Not on file  Transportation Needs: Not on file  Physical Activity: Not on file  Stress: Not on file  Social Connections: Not on file    Labs: Lab Results  Component Value Date   HIV1RNAQUANT 31 (H) 09/27/2022   HIV1RNAQUANT Not Detected 07/13/2022   HIV1RNAQUANT Not Detected 06/15/2022   CD4TABS 758 07/13/2022   CD4TABS 760 10/03/2021   CD4TABS 699 09/15/2020    RPR and STI Lab Results  Component Value Date   LABRPR NON-REACTIVE 09/27/2022   LABRPR NON-REACTIVE 09/15/2020   LABRPR NON-REACTIVE 05/13/2019   LABRPR NON-REACTIVE 08/27/2018    STI Results GC CT  09/27/2022 11:43 AM Negative    Negative    Negative  Negative    Negative    Negative   09/23/2019 12:27 PM Negative  Negative     Hepatitis B Lab Results  Component Value Date   HEPBSAB REACTIVE (A) 06/15/2022   HEPBSAG NON-REACTIVE 08/27/2018   HEPBCAB NON-REACTIVE 08/27/2018   Hepatitis C Lab Results  Component Value Date   HEPCAB NON-REACTIVE 08/27/2018   Hepatitis A Lab Results  Component Value Date   HAV REACTIVE (A) 09/27/2022   Lipids: Lab Results  Component Value Date   CHOL 238 (H) 07/13/2022   TRIG 165 (H) 07/13/2022   HDL 63 07/13/2022   CHOLHDL 3.8 07/13/2022   LDLCALC 145 (H) 07/13/2022    TARGET DATE: The 20th  Assessment: Tiffany Ray presents today for her maintenance Cabenuva injections. Past injections were tolerated well without issues. Tejal declined all STI testing today and stated she had no new partners or exposures. She agreed to a HIV RNA since she had a detectable viral load (09/2022) after being undetectable. Tiffany Ray has concerns with the medications and if they are the best option for her since her viral load is detectable, but will continue the course of  treatment for now. I explained why we need to monitor her viral load at this visit, but we are not concerned for failure with her level being so low. All questions were answered for the patient at this time.  Administered cabotegravir 600mg /82mL in left upper outer quadrant of the gluteal muscle. Administered rilpivirine 900 mg/78mL in the right upper outer quadrant of the gluteal muscle. No issues with injections. For future cabenuva injections, patient would prefer female pharmacists/nurses to give injections. She will follow up in 2 months for next set of injections.  Plan: - Cabenuva injections administered - Get HIV RNA - Next injections scheduled for 01/30/23 with Marchelle Folks at 11:30AM - Call with any issues or questions  Thanks,  Arabella Merles, PharmD. [facility] PGY-1 11/28/2022 2:47 PM

## 2022-12-02 LAB — HIV-1 RNA QUANT-NO REFLEX-BLD
HIV 1 RNA Quant: 28 Copies/mL — ABNORMAL HIGH
HIV-1 RNA Quant, Log: 1.45 Log cps/mL — ABNORMAL HIGH

## 2023-01-01 ENCOUNTER — Other Ambulatory Visit (HOSPITAL_COMMUNITY): Payer: Self-pay

## 2023-01-23 ENCOUNTER — Other Ambulatory Visit (HOSPITAL_COMMUNITY): Payer: Self-pay

## 2023-01-24 ENCOUNTER — Other Ambulatory Visit (HOSPITAL_COMMUNITY): Payer: Self-pay

## 2023-01-25 ENCOUNTER — Other Ambulatory Visit: Payer: Self-pay

## 2023-01-25 ENCOUNTER — Other Ambulatory Visit (HOSPITAL_COMMUNITY): Payer: Self-pay

## 2023-01-26 ENCOUNTER — Telehealth: Payer: Self-pay

## 2023-01-26 NOTE — Telephone Encounter (Signed)
[facility] Patient Advocate Encounter  Patient's medication (Cabenuva) have been couriered to [facility] from [facility] and will be administered on the patient next office visit on 01/30/23.  Theus Espin , [name] Specialty Pharmacy Patient Advocate [facility] Phone: [phone] Fax:  [phone]  

## 2023-01-30 ENCOUNTER — Ambulatory Visit: Payer: Medicaid Other | Admitting: Pharmacist

## 2023-01-30 ENCOUNTER — Other Ambulatory Visit: Payer: Self-pay

## 2023-01-30 DIAGNOSIS — B20 Human immunodeficiency virus [HIV] disease: Secondary | ICD-10-CM

## 2023-01-30 MED ORDER — CABOTEGRAVIR & RILPIVIRINE ER 600 & 900 MG/3ML IM SUER
1.0000 | Freq: Once | INTRAMUSCULAR | Status: AC
Start: 2023-01-30 — End: 2023-01-30
  Administered 2023-01-30: 1 via INTRAMUSCULAR

## 2023-01-30 NOTE — Progress Notes (Signed)
HPI: Tiffany Ray is a 47 y.o. female who presents to the [facility] pharmacy clinic for [place] administration.  Patient Active Problem List   Diagnosis Date Noted   Acute pain of right knee 01/27/2022   BMI 38.0-38.9,adult 10/03/2021   Numbness and tingling 09/15/2020   Post-op pain 07/30/2020   Other constipation 07/30/2020   Endometriosis    Pelvic mass in female 02/23/2020   Pelvic pain in female 02/23/2020   Menorrhagia with irregular cycle 11/04/2019   Screening for cervical cancer 11/04/2019   Type 2 diabetes mellitus (HCC) 10/06/2019   HIV disease (HCC) 09/13/2018   Healthcare maintenance 09/13/2018    Patient's Medications  New Prescriptions   No medications on file  Previous Medications   CABOTEGRAVIR & RILPIVIRINE ER (CABENUVA) 600 & 900 MG/3ML INJECTION    Inject 1 kit into the muscle every 2 (two) months.   DICLOFENAC SODIUM (VOLTAREN) 1 % GEL    Apply 2 g topically 4 (four) times daily.   MELOXICAM (MOBIC) 15 MG TABLET    TAKE 1 TABLET BY MOUTH EVERY DAY  Modified Medications   No medications on file  Discontinued Medications   No medications on file    Allergies: No Known Allergies  Past Medical History: Past Medical History:  Diagnosis Date   Abnormal uterine bleeding (AUB)    Adnexal mass    Anemia    HIV infection (HCC)    Pneumonia    Pre-diabetes    Prediabetes    Type 2 diabetes mellitus (HCC) 10/06/2019    Social History: Social History   Socioeconomic History   Marital status: Single    Spouse name: Not on file   Number of children: 4   Years of education: 12   Highest education level: Not on file  Occupational History   Not on file  Tobacco Use   Smoking status: Every Day    Current packs/day: 0.25    Types: Cigarettes   Smokeless tobacco: Never   Tobacco comments:    States back and forth with quitting  Vaping Use   Vaping status: Never Used  Substance and Sexual Activity   Alcohol use: Never   Drug use: Never    Sexual activity: Not on file    Comment: declined condoms  Other Topics Concern   Not on file  Social History Narrative   Not on file   Social Determinants of Health   Financial Resource Strain: Not on file  Food Insecurity: Not on file  Transportation Needs: Not on file  Physical Activity: Not on file  Stress: Not on file  Social Connections: Not on file    Labs: Lab Results  Component Value Date   HIV1RNAQUANT 28 (H) 11/28/2022   HIV1RNAQUANT 31 (H) 09/27/2022   HIV1RNAQUANT Not Detected 07/13/2022   CD4TABS 758 07/13/2022   CD4TABS 760 10/03/2021   CD4TABS 699 09/15/2020    RPR and STI Lab Results  Component Value Date   LABRPR NON-REACTIVE 09/27/2022   LABRPR NON-REACTIVE 09/15/2020   LABRPR NON-REACTIVE 05/13/2019   LABRPR NON-REACTIVE 08/27/2018    STI Results GC CT  09/27/2022 11:43 AM Negative    Negative    Negative  Negative    Negative    Negative   09/23/2019 12:27 PM Negative  Negative     Hepatitis B Lab Results  Component Value Date   HEPBSAB REACTIVE (A) 06/15/2022   HEPBSAG NON-REACTIVE 08/27/2018   HEPBCAB NON-REACTIVE 08/27/2018   Hepatitis C Lab  Results  Component Value Date   HEPCAB NON-REACTIVE 08/27/2018   Hepatitis A Lab Results  Component Value Date   HAV REACTIVE (A) 09/27/2022   Lipids: Lab Results  Component Value Date   CHOL 238 (H) 07/13/2022   TRIG 165 (H) 07/13/2022   HDL 63 07/13/2022   CHOLHDL 3.8 07/13/2022   LDLCALC 145 (H) 07/13/2022    TARGET DATE:  The 20th of the month  Current HIV Regimen: Cabenuva  Assessment: Tiffany Ray presents today for their maintenance Cabenuva injections. Initial/past injections were tolerated well without issues. No problems with systemic effects of injections. Patient was hesitant to continue on Cabenuva at her last visit due to concerns that she was "failing" treatment. Emphasized again that her viral load is perfectly suppressed, and she stated today she definitely wants to  continue on Cabenuva since she does not like taking pills and understands that her numbers look great.    Administered cabotegravir 600mg /72mL in left upper outer quadrant of the gluteal muscle. Administered rilpivirine 900 mg/28mL in the right upper outer quadrant of the gluteal muscle. Monitored patient for 10 minutes after injection. Injections were tolerated well without issue. Patient will follow up in 2 months for next injection. Will defer HIV RNA as was recently assessed in May. Patient politely declines STI testing given no recent sexual activity.   Plan: - Cabenuva injections administered - Next injections scheduled for 9/13 with Tammy Sours and 11/14 with me  - Call with any issues or questions  Margarite Gouge, PharmD, CPP, BCIDP, AAHIVP Clinical Pharmacist Practitioner Infectious Diseases Clinical Pharmacist [facility]

## 2023-03-09 ENCOUNTER — Other Ambulatory Visit: Payer: Self-pay | Admitting: Pharmacist

## 2023-03-09 ENCOUNTER — Other Ambulatory Visit (HOSPITAL_COMMUNITY): Payer: Self-pay

## 2023-03-09 ENCOUNTER — Other Ambulatory Visit: Payer: Self-pay

## 2023-03-09 DIAGNOSIS — B20 Human immunodeficiency virus [HIV] disease: Secondary | ICD-10-CM

## 2023-03-09 MED ORDER — CABOTEGRAVIR & RILPIVIRINE ER 600 & 900 MG/3ML IM SUER
1.0000 | INTRAMUSCULAR | 5 refills | Status: DC
  Filled 2023-03-09 (×2): qty 6, 60d supply, fill #0
  Filled 2023-05-15: qty 6, 60d supply, fill #1
  Filled 2023-07-09: qty 6, 60d supply, fill #2
  Filled 2023-08-31: qty 6, 60d supply, fill #3
  Filled 2023-11-12: qty 6, 60d supply, fill #4
  Filled 2024-01-17: qty 6, 60d supply, fill #5

## 2023-03-15 ENCOUNTER — Other Ambulatory Visit (HOSPITAL_COMMUNITY): Payer: Self-pay

## 2023-03-21 ENCOUNTER — Other Ambulatory Visit (HOSPITAL_COMMUNITY): Payer: Self-pay

## 2023-03-22 ENCOUNTER — Telehealth: Payer: Self-pay

## 2023-03-22 NOTE — Telephone Encounter (Signed)
 [facility] Patient Advocate Encounter  Patient's medication Tiffany Ray) have been couriered to [facility] from [facility] and will be administered on the patient next office visit on 03/30/23.  [name] , [name] Specialty Pharmacy Patient [name] Phone: [phone] Fax:  [phone]

## 2023-03-30 ENCOUNTER — Encounter: Payer: Self-pay | Admitting: Family

## 2023-03-30 ENCOUNTER — Other Ambulatory Visit: Payer: Self-pay

## 2023-03-30 ENCOUNTER — Ambulatory Visit (INDEPENDENT_AMBULATORY_CARE_PROVIDER_SITE_OTHER): Payer: Medicaid Other | Admitting: Family

## 2023-03-30 VITALS — BP 144/97 | HR 75 | Temp 97.6°F

## 2023-03-30 DIAGNOSIS — B20 Human immunodeficiency virus [HIV] disease: Secondary | ICD-10-CM

## 2023-03-30 DIAGNOSIS — Z23 Encounter for immunization: Secondary | ICD-10-CM | POA: Diagnosis not present

## 2023-03-30 DIAGNOSIS — Z0000 Encounter for general adult medical examination without abnormal findings: Secondary | ICD-10-CM

## 2023-03-30 DIAGNOSIS — Z79899 Other long term (current) drug therapy: Secondary | ICD-10-CM | POA: Diagnosis not present

## 2023-03-30 DIAGNOSIS — Z9189 Other specified personal risk factors, not elsewhere classified: Secondary | ICD-10-CM | POA: Diagnosis not present

## 2023-03-30 MED ORDER — CABOTEGRAVIR & RILPIVIRINE ER 600 & 900 MG/3ML IM SUER
1.0000 | Freq: Once | INTRAMUSCULAR | Status: AC
Start: 2023-03-30 — End: 2023-03-30
  Administered 2023-03-30: 1 via INTRAMUSCULAR

## 2023-03-30 NOTE — Progress Notes (Unsigned)
Brief Narrative   Patient ID: Tiffany Ray, female    DOB: [birth year], 47 y.o.   MRN: [id number]  Tiffany Ray is a 47 y/o AA female diagnosed with HIV in 2009/10 with risk factor of heterosexual contact. Initial viral load and CD4 count are unknown. No history of opportunistic infection. Initial clinic lab work with viral load of 214,000 and CD4 count of 380. Genotype with V106M.  Tiffany Ray negative. Previous ART regimen of Atripla and Biktarvy.     Subjective:    Chief Complaint  Patient presents with   Follow-up    HPI:  Tiffany Ray is a 47 y.o. female with HIV disease last seen on 01/30/23 by Margarite Gouge, PharmD, CPP with good adherence and tolerance to [facility]. Viral load was undetectable with previous CD4 count 758. Here today for follow up.  Tiffany Ray has been doing well since her last office visit and continues to have good tolerance to Tiffany Ray. Wants to ensure her blood sugars are improving. Has continued coverage through Medicaid. Healthcare maintenance due includes influenza vaccinations; and colon cancer, breast cancer and cervical cancer screenings.   Denies fevers, chills, night sweats, headaches, changes in vision, neck pain/stiffness, nausea, diarrhea, vomiting, lesions or rashes.  Lab Results  Component Value Date   CD4TCELL 41 07/13/2022   CD4TABS 758 07/13/2022   Lab Results  Component Value Date   HIV1RNAQUANT 28 (H) 11/28/2022     No Known Allergies    Outpatient Medications Prior to Visit  Medication Sig Dispense Refill   cabotegravir & rilpivirine ER (CABENUVA) 600 & 900 MG/3ML injection Inject 1 kit into the muscle every 2 (two) months. 6 mL 5   diclofenac Sodium (VOLTAREN) 1 % GEL Apply 2 g topically 4 (four) times daily. 50 g 0   meloxicam (MOBIC) 15 MG tablet TAKE 1 TABLET BY MOUTH EVERY DAY 30 tablet 0   No facility-administered medications prior to visit.     Past Medical History:  Diagnosis Date   Abnormal uterine bleeding (AUB)     Adnexal mass    Anemia    HIV infection (HCC)    Pneumonia    Pre-diabetes    Prediabetes    Type 2 diabetes mellitus (HCC) 10/06/2019     Past Surgical History:  Procedure Laterality Date   ECTOPIC PREGNANCY SURGERY     ROBOTIC ASSISTED SALPINGO OOPHERECTOMY Bilateral 07/08/2020   Procedure: XI ROBOTIC ASSISTED LEFT SALPINGO OOPHORECTOMY, RIGHT SALPINGECTOMY;  Surgeon: Carver Fila, MD;  Location: [name];  Service: Gynecology;  Laterality: Bilateral;   TUBAL LIGATION        Review of Systems  Constitutional:  Negative for appetite change, chills, diaphoresis, fatigue, fever and unexpected weight change.  Eyes:        Negative for acute change in vision  Respiratory:  Negative for chest tightness, shortness of breath and wheezing.   Cardiovascular:  Negative for chest pain.  Gastrointestinal:  Negative for diarrhea, nausea and vomiting.  Genitourinary:  Negative for dysuria, pelvic pain and vaginal discharge.  Musculoskeletal:  Negative for neck pain and neck stiffness.  Skin:  Negative for rash.  Neurological:  Negative for seizures, syncope, weakness and headaches.  Hematological:  Negative for adenopathy. Does not bruise/bleed easily.  Psychiatric/Behavioral:  Negative for hallucinations.       Objective:    BP (!) 144/97   Pulse 75   Temp 97.6 F (36.4 C) (Temporal)   LMP 06/05/2020   SpO2 94%  Nursing note and vital signs reviewed.  Physical Exam Constitutional:      General: She is not in acute distress.    Appearance: She is well-developed.  Eyes:     Conjunctiva/sclera: Conjunctivae normal.  Cardiovascular:     Rate and Rhythm: Normal rate and regular rhythm.     Heart sounds: Normal heart sounds. No murmur heard.    No friction rub. No gallop.  Pulmonary:     Effort: Pulmonary effort is normal. No respiratory distress.     Breath sounds: Normal breath sounds. No wheezing or rales.  Chest:     Chest wall: No tenderness.   Abdominal:     General: Bowel sounds are normal.     Palpations: Abdomen is soft.     Tenderness: There is no abdominal tenderness.  Musculoskeletal:     Cervical back: Neck supple.  Lymphadenopathy:     Cervical: No cervical adenopathy.  Skin:    General: Skin is warm and dry.     Findings: No rash.  Neurological:     Mental Status: She is alert and oriented to person, place, and time.  Psychiatric:        Mood and Affect: Mood normal.         03/30/2023   11:20 AM 07/13/2022    3:03 PM 10/03/2021    1:38 PM 03/29/2021   11:29 AM 09/15/2020    2:11 PM  Depression screen PHQ 2/9  Decreased Interest 0 0 0 0 0  Down, Depressed, Hopeless 0 0 0 0 0  PHQ - 2 Score 0 0 0 0 0       Assessment & Plan:    Patient Active Problem List   Diagnosis Date Noted   Acute pain of right knee 01/27/2022   BMI 38.0-38.9,adult 10/03/2021   Numbness and tingling 09/15/2020   Post-op pain 07/30/2020   Other constipation 07/30/2020   Endometriosis    Pelvic mass in female 02/23/2020   Pelvic pain in female 02/23/2020   Menorrhagia with irregular cycle 11/04/2019   Screening for cervical cancer 11/04/2019   Type 2 diabetes mellitus (HCC) 10/06/2019   HIV disease (HCC) 09/13/2018   Healthcare maintenance 09/13/2018     Problem List Items Addressed This Visit       Other   HIV disease (HCC) - Primary    Tiffany Ray continues to have well controlled virus with good adherence and tolerance to Tiffany Ray. Reviewed previous lab work and discussed U equals U. Check lab work. Continue current dose of Cabenuva. Follow up with me in 6 months or sooner if needed and Pharmacy Provider for the next 2 injections.       Relevant Orders   COMPLETE METABOLIC PANEL WITH GFR (Completed)   HIV-1 RNA quant-no reflex-bld   T-helper cells (CD4) count (not at [facility])   Healthcare maintenance    Discussed importance of safe sexual practice and condom use. Condoms and STD testing offered.  Influenza and Covid  vaccinations updated Referral placed for colon cancer screening.  Will schedule Pap smear with Rexene Alberts, NP.       Other Visit Diagnoses     Polypharmacy       Relevant Orders   Lipid panel (Completed)   At risk for colon cancer       Relevant Orders   Ambulatory referral to Gastroenterology   Encounter for immunization       Relevant Orders   Flu vaccine trivalent PF, 6mos and older(Flulaval,Afluria,Fluarix,Fluzone) (Completed)  Encounter for immunization       Relevant Orders   Pfizer Comirnaty Covid-19 Vaccine 16yrs & older (Completed)        I am having Tiffany Ray maintain her diclofenac Sodium, meloxicam, and cabotegravir & rilpivirine ER. We administered cabotegravir & rilpivirine ER.   Meds ordered this encounter  Medications   cabotegravir & rilpivirine ER (CABENUVA) 600 & 900 MG/3ML injection 1 kit     Follow-up: Return in about 6 months (around 09/27/2023). or sooner if needed.    Marcos Eke, MSN, FNP-C Nurse Practitioner [facility] for Infectious Disease [facility] Main number: [phone]

## 2023-03-30 NOTE — Patient Instructions (Addendum)
Nice to see you.  Continue to take your medication daily as prescribed.  Refills have been sent to the pharmacy.  Plan for follow up in 6 months or sooner if needed with lab work on the same day.  Have a great day and stay safe!

## 2023-03-31 ENCOUNTER — Encounter: Payer: Self-pay | Admitting: Family

## 2023-03-31 NOTE — Assessment & Plan Note (Signed)
Thelma Barge continues to have well controlled virus with good adherence and tolerance to [place]. Reviewed previous lab work and discussed U equals U. Check lab work. Continue current dose of Cabenuva. Follow up with me in 6 months or sooner if needed and Pharmacy Provider for the next 2 injections.

## 2023-03-31 NOTE — Assessment & Plan Note (Signed)
Discussed importance of safe sexual practice and condom use. Condoms and STD testing offered.  Influenza and Covid vaccinations updated Referral placed for colon cancer screening.  Will schedule Pap smear with Rexene Alberts, NP.

## 2023-04-02 LAB — HIV-1 RNA QUANT-NO REFLEX-BLD
HIV 1 RNA Quant: NOT DETECTED {copies}/mL
HIV-1 RNA Quant, Log: NOT DETECTED {Log_copies}/mL

## 2023-04-02 LAB — LIPID PANEL
Cholesterol: 234 mg/dL — ABNORMAL HIGH (ref <200)
HDL: 60 mg/dL (ref >=50)
LDL Cholesterol (Calc): 146 mg/dL — ABNORMAL HIGH
Non-HDL Cholesterol (Calc): 174 mg/dL — ABNORMAL HIGH (ref <130)
Total CHOL/HDL Ratio: 3.9 (calc) (ref <5.0)
Triglycerides: 151 mg/dL — ABNORMAL HIGH (ref <150)

## 2023-04-02 LAB — COMPLETE METABOLIC PANEL WITH GFR
AG Ratio: 1.2 (calc) (ref 1.0–2.5)
ALT: 26 U/L (ref 6–29)
AST: 19 U/L (ref 10–35)
Albumin: 4.1 g/dL (ref 3.6–5.1)
Alkaline phosphatase (APISO): 84 U/L (ref 31–125)
BUN: 12 mg/dL (ref 7–25)
CO2: 27 mmol/L (ref 20–32)
Calcium: 9.6 mg/dL (ref 8.6–10.2)
Chloride: 105 mmol/L (ref 98–110)
Creat: 0.85 mg/dL (ref 0.50–0.99)
Globulin: 3.5 g/dL (ref 1.9–3.7)
Glucose, Bld: 204 mg/dL — ABNORMAL HIGH (ref 65–99)
Potassium: 4.2 mmol/L (ref 3.5–5.3)
Sodium: 140 mmol/L (ref 135–146)
Total Bilirubin: 0.3 mg/dL (ref 0.2–1.2)
Total Protein: 7.6 g/dL (ref 6.1–8.1)
eGFR: 85 mL/min/{1.73_m2} (ref >=60)

## 2023-04-02 LAB — T-HELPER CELLS (CD4) COUNT (NOT AT ARMC)
Absolute CD4: 903 {cells}/uL (ref 490–1740)
CD4 T Helper %: 39 % (ref 30–61)
Total lymphocyte count: 2329 {cells}/uL (ref 850–3900)

## 2023-05-15 ENCOUNTER — Other Ambulatory Visit: Payer: Self-pay

## 2023-05-15 ENCOUNTER — Other Ambulatory Visit (HOSPITAL_COMMUNITY): Payer: Self-pay

## 2023-05-15 NOTE — Progress Notes (Signed)
Specialty Pharmacy Refill Coordination Note  Tiffany Ray is a 47 y.o. female assessed today regarding refills of clinic administered specialty medication(s) Cabotegravir & Rilpivirine   Clinic requested Courier to Provider Office   Delivery date: 05/24/23   Verified address: [facility] [address] [facility] [address] [id number]   Medication will be filled on 05/23/23.

## 2023-05-23 ENCOUNTER — Other Ambulatory Visit: Payer: Self-pay

## 2023-05-24 ENCOUNTER — Telehealth: Payer: Self-pay

## 2023-05-24 NOTE — Telephone Encounter (Signed)
 [facility] Patient Advocate Encounter  Patient's medications CABENUVA have been couriered to [facility] from [facility] and will be administered at the patients appointment on 05/21/23.  Kae Heller, [name] Specialty Pharmacy Patient [facility] for Infectious Disease Phone: [phone] Fax:  [phone]

## 2023-05-30 NOTE — Progress Notes (Unsigned)
   HPI: Tiffany Ray is a 47 y.o. female who presents to the [facility] pharmacy clinic for [place] administration.  Patient Active Problem List   Diagnosis Date Noted   Acute pain of right knee 01/27/2022   BMI 38.0-38.9,adult 10/03/2021   Numbness and tingling 09/15/2020   Post-op pain 07/30/2020   Other constipation 07/30/2020   Endometriosis    Pelvic mass in female 02/23/2020   Pelvic pain in female 02/23/2020   Menorrhagia with irregular cycle 11/04/2019   Screening for cervical cancer 11/04/2019   Type 2 diabetes mellitus (HCC) 10/06/2019   HIV disease (HCC) 09/13/2018   Healthcare maintenance 09/13/2018    Patient's Medications  New Prescriptions   No medications on file  Previous Medications   CABOTEGRAVIR & RILPIVIRINE ER (CABENUVA) 600 & 900 MG/3ML INJECTION    Inject 1 kit into the muscle every 2 (two) months.   DICLOFENAC SODIUM (VOLTAREN) 1 % GEL    Apply 2 g topically 4 (four) times daily.   MELOXICAM (MOBIC) 15 MG TABLET    TAKE 1 TABLET BY MOUTH EVERY DAY  Modified Medications   No medications on file  Discontinued Medications   No medications on file    Allergies: No Known Allergies  Labs: Lab Results  Component Value Date   HIV1RNAQUANT Not Detected 03/30/2023   HIV1RNAQUANT 28 (H) 11/28/2022   HIV1RNAQUANT 31 (H) 09/27/2022   CD4TABS 758 07/13/2022   CD4TABS 760 10/03/2021   CD4TABS 699 09/15/2020    RPR and STI Lab Results  Component Value Date   LABRPR NON-REACTIVE 09/27/2022   LABRPR NON-REACTIVE 09/15/2020   LABRPR NON-REACTIVE 05/13/2019   LABRPR NON-REACTIVE 08/27/2018    STI Results GC CT  09/27/2022 11:43 AM Negative    Negative    Negative  Negative    Negative    Negative   09/23/2019 12:27 PM Negative  Negative     Hepatitis B Lab Results  Component Value Date   HEPBSAB REACTIVE (A) 06/15/2022   HEPBSAG NON-REACTIVE 08/27/2018   HEPBCAB NON-REACTIVE 08/27/2018   Hepatitis C Lab Results  Component Value Date    HEPCAB NON-REACTIVE 08/27/2018   Hepatitis A Lab Results  Component Value Date   HAV REACTIVE (A) 09/27/2022   Lipids: Lab Results  Component Value Date   CHOL 234 (H) 03/30/2023   TRIG 151 (H) 03/30/2023   HDL 60 03/30/2023   CHOLHDL 3.9 03/30/2023   LDLCALC 146 (H) 03/30/2023    TARGET DATE: The 20th of the month  Assessment: Tiffany Ray presents today for her maintenance Cabenuva injections. Past injections were tolerated well without issues. Her last provider visit was on 03/30/23 with Tiffany Ray. She remains undetectable with CD4 in the 700s. She will be due to see Tiffany Ray and get labs in March. She *** STI testing today. We will check A1c, as it was 6.8 ~10 months ago. Previously discussed lifestyle modifications with Tiffany Ray.   Immunizations: Tiffany Ray is up to date on immunizations today.  Administered cabotegravir 600mg /62mL in left upper outer quadrant of the gluteal muscle. Administered rilpivirine 900 mg/24mL in the right upper outer quadrant of the gluteal muscle. No issues with injections. *** will follow up in 2 months for next set of injections.  Plan: - Cabenuva injections administered - A1c today - STI testing: *** - Next injections scheduled for *** - Call with any issues or questions  Tiffany Ray, PharmD PGY-2 Infectious Diseases Pharmacy Resident [facility] 05/30/2023 8:36 PM

## 2023-05-31 ENCOUNTER — Encounter: Payer: Medicaid Other | Admitting: Pharmacist

## 2023-05-31 ENCOUNTER — Telehealth: Payer: Self-pay

## 2023-05-31 NOTE — Telephone Encounter (Signed)
Spoke with Tiffany Ray to reschedule her [facility] appointment. We will need to see her by 06/13/23. She states that she will have to look at her schedule for next week and call back. Will follow up.

## 2023-06-12 ENCOUNTER — Other Ambulatory Visit: Payer: Self-pay

## 2023-06-12 ENCOUNTER — Ambulatory Visit (INDEPENDENT_AMBULATORY_CARE_PROVIDER_SITE_OTHER): Payer: Medicaid Other | Admitting: Pharmacist

## 2023-06-12 DIAGNOSIS — Z0000 Encounter for general adult medical examination without abnormal findings: Secondary | ICD-10-CM

## 2023-06-12 DIAGNOSIS — B20 Human immunodeficiency virus [HIV] disease: Secondary | ICD-10-CM

## 2023-06-12 MED ORDER — CABOTEGRAVIR & RILPIVIRINE ER 600 & 900 MG/3ML IM SUER
1.0000 | Freq: Once | INTRAMUSCULAR | Status: AC
Start: 2023-06-12 — End: 2023-06-12
  Administered 2023-06-12: 1 via INTRAMUSCULAR

## 2023-06-12 NOTE — Progress Notes (Signed)
HPI: Tiffany Ray is a 47 y.o. female who presents to the [facility] pharmacy clinic for [place] administration.  Patient Active Problem List   Diagnosis Date Noted   Acute pain of right knee 01/27/2022   BMI 38.0-38.9,adult 10/03/2021   Numbness and tingling 09/15/2020   Post-op pain 07/30/2020   Other constipation 07/30/2020   Endometriosis    Pelvic mass in female 02/23/2020   Pelvic pain in female 02/23/2020   Menorrhagia with irregular cycle 11/04/2019   Screening for cervical cancer 11/04/2019   Type 2 diabetes mellitus (HCC) 10/06/2019   HIV disease (HCC) 09/13/2018   Healthcare maintenance 09/13/2018    Patient's Medications  New Prescriptions   No medications on file  Previous Medications   CABOTEGRAVIR & RILPIVIRINE ER (CABENUVA) 600 & 900 MG/3ML INJECTION    Inject 1 kit into the muscle every 2 (two) months.   DICLOFENAC SODIUM (VOLTAREN) 1 % GEL    Apply 2 g topically 4 (four) times daily.   MELOXICAM (MOBIC) 15 MG TABLET    TAKE 1 TABLET BY MOUTH EVERY DAY  Modified Medications   No medications on file  Discontinued Medications   No medications on file    Allergies: No Known Allergies  Past Medical History: Past Medical History:  Diagnosis Date   Abnormal uterine bleeding (AUB)    Adnexal mass    Anemia    HIV infection (HCC)    Pneumonia    Pre-diabetes    Prediabetes    Type 2 diabetes mellitus (HCC) 10/06/2019    Social History: Social History   Socioeconomic History   Marital status: Single    Spouse name: Not on file   Number of children: 4   Years of education: 12   Highest education level: Not on file  Occupational History   Not on file  Tobacco Use   Smoking status: Every Day    Current packs/day: 0.25    Types: Cigarettes   Smokeless tobacco: Never   Tobacco comments:    States back and forth with quitting  Vaping Use   Vaping status: Never Used  Substance and Sexual Activity   Alcohol use: Never   Drug use: Never    Sexual activity: Not on file    Comment: declined condoms  Other Topics Concern   Not on file  Social History Narrative   Not on file   Social Determinants of Health   Financial Resource Strain: Not on file  Food Insecurity: Not on file  Transportation Needs: Not on file  Physical Activity: Not on file  Stress: Not on file  Social Connections: Not on file    Labs: Lab Results  Component Value Date   HIV1RNAQUANT Not Detected 03/30/2023   HIV1RNAQUANT 28 (H) 11/28/2022   HIV1RNAQUANT 31 (H) 09/27/2022   CD4TABS 758 07/13/2022   CD4TABS 760 10/03/2021   CD4TABS 699 09/15/2020    RPR and STI Lab Results  Component Value Date   LABRPR NON-REACTIVE 09/27/2022   LABRPR NON-REACTIVE 09/15/2020   LABRPR NON-REACTIVE 05/13/2019   LABRPR NON-REACTIVE 08/27/2018    STI Results GC CT  09/27/2022 11:43 AM Negative    Negative    Negative  Negative    Negative    Negative   09/23/2019 12:27 PM Negative  Negative     Hepatitis B Lab Results  Component Value Date   HEPBSAB REACTIVE (A) 06/15/2022   HEPBSAG NON-REACTIVE 08/27/2018   HEPBCAB NON-REACTIVE 08/27/2018   Hepatitis C Lab  Results  Component Value Date   HEPCAB NON-REACTIVE 08/27/2018   Hepatitis A Lab Results  Component Value Date   HAV REACTIVE (A) 09/27/2022   Lipids: Lab Results  Component Value Date   CHOL 234 (H) 03/30/2023   TRIG 151 (H) 03/30/2023   HDL 60 03/30/2023   CHOLHDL 3.9 03/30/2023   LDLCALC 146 (H) 03/30/2023    TARGET DATE:  The 20th of the month  Assessment: Tiffany Ray presents today for their maintenance Cabenuva injections. Initial/past injections were tolerated well without issues. No problems with systemic effects of injections. Patient did experience soreness when injections were administered a little lower down her gluteus muscle.   Administered cabotegravir 600mg /42mL in left upper outer quadrant of the gluteal muscle. Administered rilpivirine 900 mg/6mL in the right upper  outer quadrant of the gluteal muscle. Monitored patient for 10 minutes after injection. Injections were tolerated well without issue. Patient will follow up in 2 months for next injection. Will defer HIV RNA testing today as it was recently assessed in September. Checking A1c today per [name]'s request; he will follow up with her about results.   Plan: - Cabenuva injections administered - Check Hgb A1c - Next injections scheduled for 1/16 with me  - Call with any issues or questions  Margarite Gouge, PharmD, CPP, BCIDP, AAHIVP Clinical Pharmacist Practitioner Infectious Diseases Clinical Pharmacist [facility]

## 2023-06-12 NOTE — Progress Notes (Deleted)
   HPI: Tiffany Ray is a 47 y.o. female who presents to the [facility] pharmacy clinic for [place] administration.  Patient Active Problem List   Diagnosis Date Noted   Acute pain of right knee 01/27/2022   BMI 38.0-38.9,adult 10/03/2021   Numbness and tingling 09/15/2020   Post-op pain 07/30/2020   Other constipation 07/30/2020   Endometriosis    Pelvic mass in female 02/23/2020   Pelvic pain in female 02/23/2020   Menorrhagia with irregular cycle 11/04/2019   Screening for cervical cancer 11/04/2019   Type 2 diabetes mellitus (HCC) 10/06/2019   HIV disease (HCC) 09/13/2018   Healthcare maintenance 09/13/2018    Patient's Medications  New Prescriptions   No medications on file  Previous Medications   CABOTEGRAVIR & RILPIVIRINE ER (CABENUVA) 600 & 900 MG/3ML INJECTION    Inject 1 kit into the muscle every 2 (two) months.   DICLOFENAC SODIUM (VOLTAREN) 1 % GEL    Apply 2 g topically 4 (four) times daily.   MELOXICAM (MOBIC) 15 MG TABLET    TAKE 1 TABLET BY MOUTH EVERY DAY  Modified Medications   No medications on file  Discontinued Medications   No medications on file    Allergies: No Known Allergies  Labs: Lab Results  Component Value Date   HIV1RNAQUANT Not Detected 03/30/2023   HIV1RNAQUANT 28 (H) 11/28/2022   HIV1RNAQUANT 31 (H) 09/27/2022   CD4TABS 758 07/13/2022   CD4TABS 760 10/03/2021   CD4TABS 699 09/15/2020    RPR and STI Lab Results  Component Value Date   LABRPR NON-REACTIVE 09/27/2022   LABRPR NON-REACTIVE 09/15/2020   LABRPR NON-REACTIVE 05/13/2019   LABRPR NON-REACTIVE 08/27/2018    STI Results GC CT  09/27/2022 11:43 AM Negative    Negative    Negative  Negative    Negative    Negative   09/23/2019 12:27 PM Negative  Negative     Hepatitis B Lab Results  Component Value Date   HEPBSAB REACTIVE (A) 06/15/2022   HEPBSAG NON-REACTIVE 08/27/2018   HEPBCAB NON-REACTIVE 08/27/2018   Hepatitis C Lab Results  Component Value Date    HEPCAB NON-REACTIVE 08/27/2018   Hepatitis A Lab Results  Component Value Date   HAV REACTIVE (A) 09/27/2022   Lipids: Lab Results  Component Value Date   CHOL 234 (H) 03/30/2023   TRIG 151 (H) 03/30/2023   HDL 60 03/30/2023   CHOLHDL 3.9 03/30/2023   LDLCALC 146 (H) 03/30/2023    TARGET DATE: The 20th of the month  Assessment: Tiffany Ray presents today for her maintenance Cabenuva injections. Past injections were tolerated well without issues. Last provider visit with Tammy Sours on 03/30/23, where she was undetectable. Due for next follow up with him in March 2025. We will get an A1c today at [facility] request. Tiffany Ray is up to date on immunizations today.  Administered cabotegravir 600mg /50mL in left upper outer quadrant of the gluteal muscle. Administered rilpivirine 900 mg/51mL in the right upper outer quadrant of the gluteal muscle. No issues with injections. Tiffany Ray will follow up in 2 months for next set of injections.  Plan: - Cabenuva injections administered - Next injections scheduled for *** with Marchelle Folks, and *** with Tammy Sours - Call with any issues or questions  Lora Paula, PharmD PGY-2 Infectious Diseases Pharmacy Resident [facility] Infect[facility]

## 2023-06-13 ENCOUNTER — Other Ambulatory Visit: Payer: Self-pay | Admitting: Family

## 2023-06-13 LAB — HEMOGLOBIN A1C
Hgb A1c MFr Bld: 8.9 %{Hb} — ABNORMAL HIGH (ref <5.7)
Mean Plasma Glucose: 209 mg/dL
eAG (mmol/L): 11.6 mmol/L

## 2023-06-13 MED ORDER — EMPAGLIFLOZIN 10 MG PO TABS: 10.0000 mg | ORAL_TABLET | Freq: Every day | ORAL | 3 refills | Status: DC

## 2023-06-13 NOTE — Progress Notes (Signed)
Tiffany Ray A1c is 8.9 indicating worsening diabetic control. Given ASCVD risk of 11% will start her on Jardiance for cardiovascular disease prevention and to lower A1c.

## 2023-06-13 NOTE — Progress Notes (Signed)
FYI

## 2023-06-20 ENCOUNTER — Other Ambulatory Visit (HOSPITAL_COMMUNITY): Payer: Self-pay

## 2023-06-22 ENCOUNTER — Other Ambulatory Visit: Payer: Self-pay | Admitting: Family

## 2023-06-22 DIAGNOSIS — E1165 Type 2 diabetes mellitus with hyperglycemia: Secondary | ICD-10-CM

## 2023-06-22 MED ORDER — METFORMIN HCL ER 500 MG PO TB24
500.0000 mg | ORAL_TABLET | Freq: Every day | ORAL | 3 refills | Status: DC
Start: 2023-06-22 — End: 2023-08-02

## 2023-07-09 ENCOUNTER — Other Ambulatory Visit (HOSPITAL_COMMUNITY): Payer: Self-pay

## 2023-07-09 ENCOUNTER — Other Ambulatory Visit: Payer: Self-pay

## 2023-07-09 NOTE — Progress Notes (Signed)
Specialty Pharmacy Refill Coordination Note  Tiffany Ray is a 47 y.o. female assessed today regarding refills of clinic administered specialty medication(s) Cabotegravir & Rilpivirine [facility])   Clinic requested Courier to Provider Office   Delivery date: 07/19/23   Verified address: [address] [place] [address]   Medication will be filled on 07/17/23.

## 2023-07-17 ENCOUNTER — Other Ambulatory Visit: Payer: Self-pay

## 2023-07-19 ENCOUNTER — Other Ambulatory Visit (HOSPITAL_COMMUNITY): Payer: Self-pay

## 2023-07-19 ENCOUNTER — Telehealth: Payer: Self-pay

## 2023-07-19 NOTE — Telephone Encounter (Signed)
 [facility] Patient Advocate Encounter  Patient's medications CABENUVA  have been couriered to [facility] from [facility] and will be administered at the patients appointment on 08/02/23.  Charmaine Sharps, [name] Specialty Pharmacy Patient [facility] for Infectious Disease Phone: [phone] Fax:  [phone]

## 2023-07-20 ENCOUNTER — Other Ambulatory Visit (HOSPITAL_COMMUNITY): Payer: Self-pay

## 2023-08-02 ENCOUNTER — Ambulatory Visit: Payer: Medicaid Other | Admitting: Pharmacist

## 2023-08-02 ENCOUNTER — Other Ambulatory Visit: Payer: Self-pay

## 2023-08-02 DIAGNOSIS — B20 Human immunodeficiency virus [HIV] disease: Secondary | ICD-10-CM | POA: Diagnosis present

## 2023-08-02 DIAGNOSIS — E1165 Type 2 diabetes mellitus with hyperglycemia: Secondary | ICD-10-CM

## 2023-08-02 MED ORDER — EMPAGLIFLOZIN 10 MG PO TABS: 10.0000 mg | ORAL_TABLET | Freq: Every day | ORAL | 3 refills | Status: DC

## 2023-08-02 MED ORDER — CABOTEGRAVIR & RILPIVIRINE ER 600 & 900 MG/3ML IM SUER
1.0000 | Freq: Once | INTRAMUSCULAR | Status: AC
  Administered 2023-08-02: 1 via INTRAMUSCULAR

## 2023-08-02 MED ORDER — METFORMIN HCL ER 500 MG PO TB24: 500.0000 mg | ORAL_TABLET | Freq: Every day | ORAL | 3 refills | Status: DC

## 2023-08-02 NOTE — Progress Notes (Signed)
HPI: Tiffany Ray is a 48 y.o. female who presents to the [facility] pharmacy clinic for [place] administration.  Patient Active Problem List   Diagnosis Date Noted   Acute pain of right knee 01/27/2022   BMI 38.0-38.9,adult 10/03/2021   Numbness and tingling 09/15/2020   Post-op pain 07/30/2020   Other constipation 07/30/2020   Endometriosis    Pelvic mass in female 02/23/2020   Pelvic pain in female 02/23/2020   Menorrhagia with irregular cycle 11/04/2019   Screening for cervical cancer 11/04/2019   Type 2 diabetes mellitus (HCC) 10/06/2019   HIV disease (HCC) 09/13/2018   Healthcare maintenance 09/13/2018    Patient's Medications  New Prescriptions   No medications on file  Previous Medications   CABOTEGRAVIR & RILPIVIRINE ER (CABENUVA) 600 & 900 MG/3ML INJECTION    Inject 1 kit into the muscle every 2 (two) months.   DICLOFENAC SODIUM (VOLTAREN) 1 % GEL    Apply 2 g topically 4 (four) times daily.   MELOXICAM (MOBIC) 15 MG TABLET    TAKE 1 TABLET BY MOUTH EVERY DAY  Modified Medications   Modified Medication Previous Medication   EMPAGLIFLOZIN (JARDIANCE) 10 MG TABS TABLET empagliflozin (JARDIANCE) 10 MG TABS tablet      Take 1 tablet (10 mg total) by mouth daily before breakfast.    Take 1 tablet (10 mg total) by mouth daily before breakfast.   METFORMIN (GLUCOPHAGE-XR) 500 MG 24 HR TABLET metFORMIN (GLUCOPHAGE-XR) 500 MG 24 hr tablet      Take 1 tablet (500 mg total) by mouth daily with breakfast.    Take 1 tablet (500 mg total) by mouth daily with breakfast.  Discontinued Medications   No medications on file    Allergies: No Known Allergies  Labs: Lab Results  Component Value Date   HIV1RNAQUANT Not Detected 03/30/2023   HIV1RNAQUANT 28 (H) 11/28/2022   HIV1RNAQUANT 31 (H) 09/27/2022   CD4TABS 758 07/13/2022   CD4TABS 760 10/03/2021   CD4TABS 699 09/15/2020    RPR and STI Lab Results  Component Value Date   LABRPR NON-REACTIVE 09/27/2022   LABRPR  NON-REACTIVE 09/15/2020   LABRPR NON-REACTIVE 05/13/2019   LABRPR NON-REACTIVE 08/27/2018    STI Results GC CT  09/27/2022 11:43 AM Negative    Negative    Negative  Negative    Negative    Negative   09/23/2019 12:27 PM Negative  Negative     Hepatitis B Lab Results  Component Value Date   HEPBSAB REACTIVE (A) 06/15/2022   HEPBSAG NON-REACTIVE 08/27/2018   HEPBCAB NON-REACTIVE 08/27/2018   Hepatitis C Lab Results  Component Value Date   HEPCAB NON-REACTIVE 08/27/2018   Hepatitis A Lab Results  Component Value Date   HAV REACTIVE (A) 09/27/2022   Lipids: Lab Results  Component Value Date   CHOL 234 (H) 03/30/2023   TRIG 151 (H) 03/30/2023   HDL 60 03/30/2023   CHOLHDL 3.9 03/30/2023   LDLCALC 146 (H) 03/30/2023    TARGET DATE: 20th day of the month  Assessment: [name] presents today for their maintenance Cabenuva injections. Past injections were tolerated well without issues. Last HIV RNA was undetectable and CD4 903 in 03/2023. Denies s/sx of STI. Declines full STI testing stating no recent exposure.   Administered cabotegravir 600mg /58mL in left upper outer quadrant of the gluteal muscle. Administered rilpivirine 900 mg/86mL in the right upper outer quadrant of the gluteal muscle. No issues with injections. They will follow up in 2 months for  next set of injections.  Patient shares that they experience cold/flu-like symptoms about 2 weeks ago with lingering cough. They are currently using OTC Mucinex to address her cough. They share being unsatisfied with current OTC regimen for cough. We recommended switching to OTC dextromethorphan (Delsym) to address their lingering cough.   PMH: They were last seen on 06/12/23 by Haywood Lasso where their blood glucose level and A1C were collected per Jimmy Footman, NP, request. Patient was started on metformin 500 mg every day and Jardiance 10 mg every day by Tammy Sours. Today, patient reports no knowledge of these two new  prescriptions. Patient was provided medication education (dosing, frequency, indication, side effect profile) for both medications. Patient requested rx transfer to [facility] in [address]   Plan: - Cabenuva injections administered - Transfer metformin and Jardiance prescriptions to [facility] in [place]. [address] - Next injections scheduled for [name] on 10/04/2023 and on 11/27/23 with Marchelle Folks - HIV RNA next follow up visit  - Call with any issues or questions  Dimple Casey. Edwena Blow, PharmD Candidate [employer]

## 2023-08-22 ENCOUNTER — Telehealth: Payer: Self-pay

## 2023-08-22 DIAGNOSIS — B20 Human immunodeficiency virus [HIV] disease: Secondary | ICD-10-CM

## 2023-08-22 NOTE — Telephone Encounter (Signed)
 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 Informed patient we do not do physicals at our office but if she brings in paperwork provider could look to see if he's able to complete. Patient stated that she needed the form completed by Friday - Informed her provider isn't in the office this week but I will send him a message.    Quantiferon-TB Gold lab ordered - patient will come in on 2/6.   Tiffany Ray [name] Letters, CMA

## 2023-08-23 ENCOUNTER — Other Ambulatory Visit: Payer: Self-pay

## 2023-08-23 ENCOUNTER — Other Ambulatory Visit: Payer: Medicaid Other

## 2023-08-23 DIAGNOSIS — B20 Human immunodeficiency virus [HIV] disease: Secondary | ICD-10-CM

## 2023-08-23 NOTE — Telephone Encounter (Signed)
 Patient dropped form off this afternoon while getting labs. Patient stated she would like form completed prior to going out of town on 2/14. Form placed in provider box.   Patient aware TB test will take a couple days to come back.  Tiffany Ray [name] Letters, CMA

## 2023-08-26 LAB — QUANTIFERON-TB GOLD PLUS
Mitogen-NIL: 6.08 [IU]/mL
NIL: 0.05 [IU]/mL
QuantiFERON-TB Gold Plus: NEGATIVE
TB1-NIL: 0.04 [IU]/mL
TB2-NIL: 0.02 [IU]/mL

## 2023-08-29 ENCOUNTER — Other Ambulatory Visit (HOSPITAL_COMMUNITY): Payer: Self-pay

## 2023-08-29 ENCOUNTER — Telehealth: Payer: Self-pay

## 2023-08-29 NOTE — Telephone Encounter (Signed)
Form completed by [place],NP - Form and TB results left up front for patient to pick up. Patient aware.    Christiona Siddique Lesli Albee, CMA

## 2023-08-29 NOTE — Telephone Encounter (Signed)
Patient called, reports her insulin prescription is $700. Discussed that she is not being prescribed insulin. After further discussion, it is likely her [place] that she is having trouble getting from the pharmacy.   States she is at work and would like a call back after 2:30 PM.   Sandie Ano, RN

## 2023-08-31 ENCOUNTER — Telehealth: Payer: Self-pay

## 2023-08-31 ENCOUNTER — Other Ambulatory Visit (HOSPITAL_COMMUNITY): Payer: Self-pay

## 2023-08-31 ENCOUNTER — Other Ambulatory Visit: Payer: Self-pay

## 2023-08-31 NOTE — Progress Notes (Signed)
Specialty Pharmacy Refill Coordination Note  Tiffany Ray is a 48 y.o. female assessed today regarding refills of clinic administered specialty medication(s) Cabotegravir & Rilpivirine [facility])   Clinic requested Courier to Provider Office   Delivery date: 09/25/23   Verified address: [address] [place] [address]   Medication will be filled on 09/24/23.

## 2023-08-31 NOTE — Telephone Encounter (Signed)
PA for Jardiance submitted via covermymeds. Awaiting response.   Key: [id number]  Sandie Ano, RN

## 2023-09-03 NOTE — Telephone Encounter (Signed)
   Approval faxed to pharmacy.   Sandie Ano, RN

## 2023-09-14 ENCOUNTER — Telehealth: Payer: Self-pay

## 2023-09-14 MED ORDER — FLUCONAZOLE 150 MG PO TABS
150.0000 mg | ORAL_TABLET | Freq: Every day | ORAL | 1 refills | Status: DC
Start: 2023-09-14 — End: 2023-10-04

## 2023-09-14 NOTE — Telephone Encounter (Signed)
Nystatin swish and swallow sent to pharmacy

## 2023-09-14 NOTE — Addendum Note (Signed)
 Addended by: Jeanine Luz D on: 09/14/2023 10:27 AM   Modules accepted: Orders

## 2023-09-14 NOTE — Telephone Encounter (Signed)
 Patient informed.

## 2023-09-14 NOTE — Telephone Encounter (Signed)
 Patient called complaining of vaginal itching. Patient states she believes she has developed a yeast infection from the [place] and would like something prescribed.  Tiffany Ray Jonathon Resides, CMA

## 2023-09-21 ENCOUNTER — Other Ambulatory Visit: Payer: Self-pay

## 2023-09-25 ENCOUNTER — Telehealth: Payer: Self-pay

## 2023-09-25 NOTE — Telephone Encounter (Signed)
 [facility] Patient Advocate Encounter  Patient's medications CABENUVA have been couriered to [facility] from [facility] and will be administered at the patients appointment on 10/04/23.  Kae Heller, [name] Specialty Pharmacy Patient [facility] for Infectious Disease Phone: [phone] Fax:  [phone]

## 2023-10-04 ENCOUNTER — Ambulatory Visit (INDEPENDENT_AMBULATORY_CARE_PROVIDER_SITE_OTHER): Payer: Medicaid Other | Admitting: Family

## 2023-10-04 ENCOUNTER — Telehealth: Payer: Self-pay

## 2023-10-04 ENCOUNTER — Encounter: Payer: Self-pay | Admitting: Family

## 2023-10-04 ENCOUNTER — Other Ambulatory Visit (HOSPITAL_COMMUNITY): Payer: Self-pay

## 2023-10-04 ENCOUNTER — Other Ambulatory Visit: Payer: Self-pay

## 2023-10-04 VITALS — BP 122/84 | HR 94 | Temp 98.3°F | Ht 70.0 in | Wt 266.0 lb

## 2023-10-04 DIAGNOSIS — Z124 Encounter for screening for malignant neoplasm of cervix: Secondary | ICD-10-CM

## 2023-10-04 DIAGNOSIS — Z6838 Body mass index (BMI) 38.0-38.9, adult: Secondary | ICD-10-CM | POA: Diagnosis not present

## 2023-10-04 DIAGNOSIS — E1165 Type 2 diabetes mellitus with hyperglycemia: Secondary | ICD-10-CM

## 2023-10-04 DIAGNOSIS — Z7984 Long term (current) use of oral hypoglycemic drugs: Secondary | ICD-10-CM | POA: Diagnosis not present

## 2023-10-04 DIAGNOSIS — E119 Type 2 diabetes mellitus without complications: Secondary | ICD-10-CM | POA: Diagnosis not present

## 2023-10-04 DIAGNOSIS — Z0000 Encounter for general adult medical examination without abnormal findings: Secondary | ICD-10-CM

## 2023-10-04 DIAGNOSIS — B20 Human immunodeficiency virus [HIV] disease: Secondary | ICD-10-CM | POA: Diagnosis not present

## 2023-10-04 MED ORDER — METFORMIN HCL ER 500 MG PO TB24: 500.0000 mg | ORAL_TABLET | Freq: Every day | ORAL | 3 refills | Status: DC

## 2023-10-04 MED ORDER — CABOTEGRAVIR & RILPIVIRINE ER 600 & 900 MG/3ML IM SUER
1.0000 | Freq: Once | INTRAMUSCULAR | Status: AC
  Administered 2023-10-04: 1 via INTRAMUSCULAR

## 2023-10-04 NOTE — Telephone Encounter (Signed)
 Pharmacy Patient Advocate Encounter   Received notification from CoverMyMeds that prior authorization for [facility] is required/requested.   Insurance verification completed.   The patient is insured through [employer] .   Per test claim: PA required; PA submitted to above mentioned insurance via CoverMyMeds Key/confirmation #/EOC  [id number] Status is pending   CLINICAL QUESTIONS ANSWERED

## 2023-10-04 NOTE — Assessment & Plan Note (Signed)
 Ms. Lehnert continues to have well controlled virus with good adherence and tolerance to [place]. Reviewed previous lab work and discussed plan of care and U equals U.  Covered by Medicaid.  Social determinants of health reviewed with no interventions indicated.  Check blood work.  Continue current dose of Cabenuva.  Plan for follow-up in 4 months and with pharmacy provider in between.

## 2023-10-04 NOTE — Telephone Encounter (Addendum)
 Pharmacy Patient Advocate Encounter  Received notification from [facility] that Prior Authorization for [facility] has been APPROVED from 10/04/23 to 10/03/24. Ran test claim, Copay is $4. This test claim was processed through [facility]- copay amounts may vary at other pharmacies due to pharmacy/plan contracts, or as the patient moves through the different stages of their insurance plan.   PA #/Case ID/Reference #: [id number]

## 2023-10-04 NOTE — Progress Notes (Signed)
 Brief Narrative   Patient ID: Tiffany Ray, female    DOB: [birth year], 48 y.o.   MRN: [id number]  Ms. Harrow is a 48 y/o AA female diagnosed with HIV in 2009/10 with risk factor of heterosexual contact. Initial viral load and CD4 count are unknown. No history of opportunistic infection. Initial clinic lab work with viral load of 214,000 and CD4 count of 380. Genotype with V106M.  [id number] negative. Previous ART regimen of Atripla and Biktarvy.     Subjective:    Chief Complaint  Patient presents with   Follow-up    HPI:  Tiffany Ray is a 48 y.o. female with HIV disease and diabetes  last seen on 08/02/2023 by Margarite Gouge, PharmD, CPP with good adherence and tolerance to [facility].  Last lab work performed on 03/30/2023 with viral load that was undetectable and CD4 count of 903.  Hemoglobin A1c increased on 06/12/2023 at 8.9 and was started on Jardiance and extended release metformin.  Here today for routine follow-up.  Ms. Paff has been doing well since her last office visit and continues to receive Cabenuva as prescribed with no adverse side effects.  Has been taking the Jardiance and has had a yeast infection after starting it that was refractory to 1 dose of fluconazole.  Hesitant to take metformin secondary to research and family input.  Currently not in favor of GLP-1 injections.  Nutrition therapy offered and declined.  Condoms and site-specific STD testing offered.  Healthcare maintenance reviewed.  Housing is stable and working full-time.  Access to food is good.  Transportation via personal vehicle.  Denies fevers, chills, night sweats, headaches, changes in vision, neck pain/stiffness, nausea, diarrhea, vomiting, lesions or rashes.  Lab Results  Component Value Date   CD4TCELL 39 03/30/2023   CD4TABS 758 07/13/2022   Lab Results  Component Value Date   HIV1RNAQUANT Not Detected 03/30/2023     No Known Allergies   Outpatient Medications Prior to Visit   Medication Sig Dispense Refill   cabotegravir & rilpivirine ER (CABENUVA) 600 & 900 MG/3ML injection Inject 1 kit into the muscle every 2 (two) months. 6 mL 5   empagliflozin (JARDIANCE) 10 MG TABS tablet Take 1 tablet (10 mg total) by mouth daily before breakfast. 30 tablet 3   metFORMIN (GLUCOPHAGE-XR) 500 MG 24 hr tablet Take 1 tablet (500 mg total) by mouth daily with breakfast. 30 tablet 3   diclofenac Sodium (VOLTAREN) 1 % GEL Apply 2 g topically 4 (four) times daily. (Patient not taking: Reported on 10/04/2023) 50 g 0   fluconazole (DIFLUCAN) 150 MG tablet Take 1 tablet (150 mg total) by mouth daily. Repeat in 3 days if needed. (Patient not taking: Reported on 10/04/2023) 1 tablet 1   meloxicam (MOBIC) 15 MG tablet TAKE 1 TABLET BY MOUTH EVERY DAY (Patient not taking: Reported on 10/04/2023) 30 tablet 0   No facility-administered medications prior to visit.     Past Medical History:  Diagnosis Date   Abnormal uterine bleeding (AUB)    Adnexal mass    Anemia    HIV infection (HCC)    Pneumonia    Pre-diabetes    Prediabetes    Type 2 diabetes mellitus (HCC) 10/06/2019     Past Surgical History:  Procedure Laterality Date   ECTOPIC PREGNANCY SURGERY     ROBOTIC ASSISTED SALPINGO OOPHERECTOMY Bilateral 07/08/2020   Procedure: XI ROBOTIC ASSISTED LEFT SALPINGO OOPHORECTOMY, RIGHT SALPINGECTOMY;  Surgeon: Carver Fila, MD;  Location:  [facility];  Service: Gynecology;  Laterality: Bilateral;   TUBAL LIGATION        Review of Systems  Constitutional:  Negative for appetite change, chills, diaphoresis, fatigue, fever and unexpected weight change.  Eyes:        Negative for acute change in vision  Respiratory:  Negative for chest tightness, shortness of breath and wheezing.   Cardiovascular:  Negative for chest pain.  Gastrointestinal:  Negative for diarrhea, nausea and vomiting.  Genitourinary:  Negative for dysuria, pelvic pain and vaginal discharge.   Musculoskeletal:  Negative for neck pain and neck stiffness.  Skin:  Negative for rash.  Neurological:  Negative for seizures, syncope, weakness and headaches.  Hematological:  Negative for adenopathy. Does not bruise/bleed easily.  Psychiatric/Behavioral:  Negative for hallucinations.       Objective:    BP 122/84   Pulse 94   Temp 98.3 F (36.8 C) (Oral)   Ht 5\' 10"  (1.778 m)   Wt 266 lb (120.7 kg)   LMP 06/05/2020   SpO2 97%   BMI 38.17 kg/m  Nursing note and vital signs reviewed.  Physical Exam Constitutional:      General: She is not in acute distress.    Appearance: She is well-developed.  Eyes:     Conjunctiva/sclera: Conjunctivae normal.  Cardiovascular:     Rate and Rhythm: Normal rate and regular rhythm.     Heart sounds: Normal heart sounds. No murmur heard.    No friction rub. No gallop.  Pulmonary:     Effort: Pulmonary effort is normal. No respiratory distress.     Breath sounds: Normal breath sounds. No wheezing or rales.  Chest:     Chest wall: No tenderness.  Abdominal:     General: Bowel sounds are normal.     Palpations: Abdomen is soft.     Tenderness: There is no abdominal tenderness.  Musculoskeletal:     Cervical back: Neck supple.  Lymphadenopathy:     Cervical: No cervical adenopathy.  Skin:    General: Skin is warm and dry.     Findings: No rash.  Neurological:     Mental Status: She is alert and oriented to person, place, and time.  Psychiatric:        Behavior: Behavior normal.        Thought Content: Thought content normal.        Judgment: Judgment normal.         10/04/2023    1:41 PM 03/30/2023   11:20 AM 07/13/2022    3:03 PM 10/03/2021    1:38 PM 03/29/2021   11:29 AM  Depression screen PHQ 2/9  Decreased Interest 0 0 0 0 0  Down, Depressed, Hopeless 0 0 0 0 0  PHQ - 2 Score 0 0 0 0 0       Assessment & Plan:    Patient Active Problem List   Diagnosis Date Noted   Acute pain of right knee 01/27/2022   BMI  38.0-38.9,adult 10/03/2021   Numbness and tingling 09/15/2020   Post-op pain 07/30/2020   Other constipation 07/30/2020   Endometriosis    Pelvic mass in female 02/23/2020   Pelvic pain in female 02/23/2020   Menorrhagia with irregular cycle 11/04/2019   Screening for cervical cancer 11/04/2019   Type 2 diabetes mellitus (HCC) 10/06/2019   HIV disease (HCC) 09/13/2018   Healthcare maintenance 09/13/2018     Problem List Items Addressed This Visit  Endocrine   Type 2 diabetes mellitus (HCC)   Ms. Croston has newly diagnosed type 2 diabetes and has been on metformin and Jardiance to start.  She has had candidal infections with Jardiance.  Will discontinue Jardiance and continue with metformin.  Discussed risks and benefits of metformin as she is not interested in GLP-1 injections at this time.  Discussed importance of nutritional therapy and declines referral to dietetics.  Due for diabetic eye exam. Check blood sugar and A1c. Continue current dose of metformin. Plan for follow up in 3 month or sooner if needed.       Relevant Medications   metFORMIN (GLUCOPHAGE-XR) 500 MG 24 hr tablet   Other Relevant Orders   HgB A1c   Urine Microalbumin w/creat. ratio     Other   HIV disease (HCC) - Primary   Ms. Mccumbers continues to have well controlled virus with good adherence and tolerance to [place]. Reviewed previous lab work and discussed plan of care and U equals U.  Covered by Medicaid.  Social determinants of health reviewed with no interventions indicated.  Check blood work.  Continue current dose of Cabenuva.  Plan for follow-up in 4 months and with pharmacy provider in between.      Relevant Orders   COMPLETE METABOLIC PANEL WITH GFR   HIV-1 RNA quant-no reflex-bld   T-helper cell (CD4)- ([facility] clinic only)   Healthcare maintenance   Discussed importance of safe sexual practice and condom use. Condoms and site specific STD testing offered.  Vaccinations reviewed - declined  today.  Due for colonoscopy with information to schedule in AVS.  Due for cervical cancer screening and will schedule PAP. Due for routine dental care which she will schedule independently.       Screening for cervical cancer   Due for cervical cancer screening and will schedule appointment with Judeth Cornfield at next opportunity.       BMI 38.0-38.9,adult   BMI 38. Discussed importance of lifestyle management and now in the setting of diabetes. Would be a good candidate for treatment with GLP-1 but declines at this time. Will continue with lifestyle management.         I have discontinued Doren Custard. Vargo's diclofenac Sodium, meloxicam, empagliflozin, and fluconazole. I am also having her maintain her cabotegravir & rilpivirine ER and metFORMIN.   Meds ordered this encounter  Medications   metFORMIN (GLUCOPHAGE-XR) 500 MG 24 hr tablet    Sig: Take 1 tablet (500 mg total) by mouth daily with breakfast.    Dispense:  30 tablet    Refill:  3    Supervising Provider:   Judyann Munson [[id number]]     Follow-up: Return in about 4 months (around 02/03/2024). or sooner if needed.    Marcos Eke, MSN, FNP-C Nurse Practitioner [facility] for Infectious Disease [facility] Main number: [phone]

## 2023-10-04 NOTE — Assessment & Plan Note (Signed)
 BMI 38. Discussed importance of lifestyle management and now in the setting of diabetes. Would be a good candidate for treatment with GLP-1 but declines at this time. Will continue with lifestyle management.

## 2023-10-04 NOTE — Assessment & Plan Note (Signed)
 Tiffany Ray has newly diagnosed type 2 diabetes and has been on metformin and Jardiance to start.  She has had candidal infections with Jardiance.  Will discontinue Jardiance and continue with metformin.  Discussed risks and benefits of metformin as she is not interested in GLP-1 injections at this time.  Discussed importance of nutritional therapy and declines referral to dietetics.  Due for diabetic eye exam. Check blood sugar and A1c. Continue current dose of metformin. Plan for follow up in 3 month or sooner if needed.

## 2023-10-04 NOTE — Assessment & Plan Note (Signed)
 Due for cervical cancer screening and will schedule appointment with Judeth Cornfield at next opportunity.

## 2023-10-04 NOTE — Telephone Encounter (Signed)
 Pharmacy Patient Advocate Encounter   Received notification from CoverMyMeds that prior authorization for [facility] is required/requested.   Insurance verification completed.   The patient is insured through [employer] .   Per test claim: PA required; PA submitted to above mentioned insurance via CoverMyMeds Key/confirmation #/EOC [id number] Status is pending   CLINICAL QUESTIONS ANSWERED

## 2023-10-04 NOTE — Assessment & Plan Note (Signed)
 Discussed importance of safe sexual practice and condom use. Condoms and site specific STD testing offered.  Vaccinations reviewed - declined today.  Due for colonoscopy with information to schedule in AVS.  Due for cervical cancer screening and will schedule PAP. Due for routine dental care which she will schedule independently.

## 2023-10-04 NOTE — Addendum Note (Signed)
 Addended by: Philippa Chester on: 10/04/2023 03:03 PM   Modules accepted: Orders

## 2023-10-04 NOTE — Patient Instructions (Addendum)
 Nice to see you.  We will check your lab work today.  Continue to take your medication daily as prescribed.  Refills have been sent to the pharmacy.  Call Medicaid to determine eye coverage.   Plan for follow up in 4 months with Rexene Alberts, NP for Pap and follow up or sooner if needed with lab work on the same day and pharmacy in between.   Have a great day and stay safe!  For colonoscopy:  [phone] option 1

## 2023-10-05 LAB — MICROALBUMIN / CREATININE URINE RATIO
Creatinine, Urine: 73 mg/dL (ref 20–275)
Microalb Creat Ratio: 3 mg/g{creat} (ref <30)
Microalb, Ur: 0.2 mg/dL

## 2023-10-05 LAB — T-HELPER CELL (CD4) - (RCID CLINIC ONLY)
CD4 % Helper T Cell: 41 % (ref 33–65)
CD4 T Cell Abs: 784 /uL (ref 400–1790)

## 2023-10-05 NOTE — Telephone Encounter (Signed)
 Pharmacy Patient Advocate Encounter  Received notification from [facility] that Prior Authorization for [facility] has been DENIED.  No reason given; No denial letter received via Fax or CMM. It has been requested and will be uploaded to the media tab once received.   PA #/Case ID/Reference #: [id number]  MOST LIKELY DENIED DUE TO OZEMPIC BEING APPROVED

## 2023-10-06 LAB — COMPLETE METABOLIC PANEL WITH GFR
AG Ratio: 1.5 (calc) (ref 1.0–2.5)
ALT: 25 U/L (ref 6–29)
AST: 20 U/L (ref 10–35)
Albumin: 4.6 g/dL (ref 3.6–5.1)
Alkaline phosphatase (APISO): 81 U/L (ref 31–125)
BUN: 10 mg/dL (ref 7–25)
CO2: 26 mmol/L (ref 20–32)
Calcium: 9.9 mg/dL (ref 8.6–10.2)
Chloride: 106 mmol/L (ref 98–110)
Creat: 0.88 mg/dL (ref 0.50–0.99)
Globulin: 3.1 g/dL (ref 1.9–3.7)
Glucose, Bld: 128 mg/dL — ABNORMAL HIGH (ref 65–99)
Potassium: 3.9 mmol/L (ref 3.5–5.3)
Sodium: 141 mmol/L (ref 135–146)
Total Bilirubin: 0.3 mg/dL (ref 0.2–1.2)
Total Protein: 7.7 g/dL (ref 6.1–8.1)

## 2023-10-06 LAB — HEMOGLOBIN A1C
Hgb A1c MFr Bld: 8.2 %{Hb} — ABNORMAL HIGH (ref <5.7)
Mean Plasma Glucose: 189 mg/dL
eAG (mmol/L): 10.4 mmol/L

## 2023-10-06 LAB — HIV-1 RNA QUANT-NO REFLEX-BLD
HIV 1 RNA Quant: <20 {copies}/mL — AB
HIV-1 RNA Quant, Log: <1.3 {Log_copies}/mL — AB

## 2023-11-12 ENCOUNTER — Other Ambulatory Visit: Payer: Self-pay

## 2023-11-12 ENCOUNTER — Other Ambulatory Visit (HOSPITAL_COMMUNITY): Payer: Self-pay

## 2023-11-12 NOTE — Progress Notes (Signed)
 Specialty Pharmacy Refill Coordination Note  Tiffany Ray is a 48 y.o. female assessed today regarding refills of clinic administered specialty medication(s) Cabotegravir  & Rilpivirine  (CABENUVA )   Clinic requested Courier to Provider Office   Delivery date: 11/19/23   Verified address: [address] [place] [address]   Medication will be filled on 11/16/23.

## 2023-11-16 ENCOUNTER — Other Ambulatory Visit: Payer: Self-pay

## 2023-11-19 ENCOUNTER — Telehealth: Payer: Self-pay

## 2023-11-19 NOTE — Telephone Encounter (Signed)
 [facility] Patient Advocate Encounter  Patient's medications Cabenuva  have been couriered to [facility] from [facility] and will be administered at the patients appointment on 11/27/23.  Tiffany Ray, [name] Specialty Pharmacy Patient [name] for Infectious Disease Phone: [phone] Fax:  [phone]

## 2023-11-26 NOTE — Progress Notes (Unsigned)
   HPI: Tiffany Ray is a 48 y.o. female who presents to the [facility] pharmacy clinic for Cabenuva  administration.  Patient Active Problem List   Diagnosis Date Noted   Acute pain of right knee 01/27/2022   BMI 38.0-38.9,adult 10/03/2021   Numbness and tingling 09/15/2020   Post-op pain 07/30/2020   Other constipation 07/30/2020   Endometriosis    Pelvic mass in female 02/23/2020   Pelvic pain in female 02/23/2020   Menorrhagia with irregular cycle 11/04/2019   Screening for cervical cancer 11/04/2019   Type 2 diabetes mellitus (HCC) 10/06/2019   HIV disease (HCC) 09/13/2018   Healthcare maintenance 09/13/2018    Patient's Medications  New Prescriptions   No medications on file  Previous Medications   CABOTEGRAVIR  & RILPIVIRINE  ER (CABENUVA ) 600 & 900 MG/3ML INJECTION    Inject 1 kit into the muscle every 2 (two) months.   METFORMIN  (GLUCOPHAGE -XR) 500 MG 24 HR TABLET    Take 1 tablet (500 mg total) by mouth daily with breakfast.  Modified Medications   No medications on file  Discontinued Medications   No medications on file    Allergies: No Known Allergies  Labs: Lab Results  Component Value Date   HIV1RNAQUANT <20 DETECTED (A) 10/04/2023   HIV1RNAQUANT Not Detected 03/30/2023   HIV1RNAQUANT 28 (H) 11/28/2022   CD4TABS 784 10/04/2023   CD4TABS 758 07/13/2022   CD4TABS 760 10/03/2021    RPR and STI Lab Results  Component Value Date   LABRPR NON-REACTIVE 09/27/2022   LABRPR NON-REACTIVE 09/15/2020   LABRPR NON-REACTIVE 05/13/2019   LABRPR NON-REACTIVE 08/27/2018    STI Results GC CT  09/27/2022 11:43 AM Negative    Negative    Negative  Negative    Negative    Negative   09/23/2019 12:27 PM Negative  Negative     Hepatitis B Lab Results  Component Value Date   HEPBSAB REACTIVE (A) 06/15/2022   HEPBSAG NON-REACTIVE 08/27/2018   HEPBCAB NON-REACTIVE 08/27/2018   Hepatitis C Lab Results  Component Value Date   HEPCAB NON-REACTIVE 08/27/2018    Hepatitis A Lab Results  Component Value Date   HAV REACTIVE (A) 09/27/2022   Lipids: Lab Results  Component Value Date   CHOL 234 (H) 03/30/2023   TRIG 151 (H) 03/30/2023   HDL 60 03/30/2023   CHOLHDL 3.9 03/30/2023   LDLCALC 146 (H) 03/30/2023    TARGET DATE: 20th of the month  Assessment: [name] presents today for her maintenance Cabenuva  injections. Past injections were tolerated well without issues. Last HIV RNA was undetectable in March. Doing well with no issues today.  Administered cabotegravir  600mg /36mL in left upper outer quadrant of the gluteal muscle. Administered rilpivirine  900 mg/3mL in the right upper outer quadrant of the gluteal muscle. No issues with injections. She will follow up in 2 months for next set of injections.  Plan: - Cabenuva  injections administered - Next injections scheduled for *** - Call with any issues or questions   Valarie Garner, PharmD PGY1 Pharmacy Resident

## 2023-11-27 ENCOUNTER — Ambulatory Visit: Payer: Self-pay | Admitting: Pharmacist

## 2023-11-27 ENCOUNTER — Other Ambulatory Visit: Payer: Self-pay

## 2023-11-27 DIAGNOSIS — Z23 Encounter for immunization: Secondary | ICD-10-CM | POA: Diagnosis not present

## 2023-11-27 DIAGNOSIS — B20 Human immunodeficiency virus [HIV] disease: Secondary | ICD-10-CM

## 2023-11-27 MED ORDER — CABOTEGRAVIR & RILPIVIRINE ER 600 & 900 MG/3ML IM SUER
1.0000 | Freq: Once | INTRAMUSCULAR | Status: AC
  Administered 2023-11-27: 1 via INTRAMUSCULAR

## 2023-12-07 NOTE — Progress Notes (Signed)
 The 10-year ASCVD risk score (Arnett DK, et al., 2019) is: 5.9%   Values used to calculate the score:     Age: 48 years     Sex: Female     Is Non-Hispanic African American: Yes     Diabetic: Yes     Tobacco smoker: Yes     Systolic Blood Pressure: 122 mmHg     Is BP treated: No     HDL Cholesterol: 60 mg/dL     Total Cholesterol: 234 mg/dL  No current statin therapy, next appointment note updated.   Azahel Belcastro, BSN, RN

## 2024-01-08 ENCOUNTER — Telehealth: Payer: Self-pay | Admitting: Pharmacist

## 2024-01-08 NOTE — Telephone Encounter (Signed)
 Patient states she needs new TB test for job. Last Quantiferon checked February this year. Asked her to please confirm with the new employer if that is recent enough. If not, we can schedule lab visit to come in for a redraw.  Alan Geralds, PharmD, CPP, BCIDP, AAHIVP Clinical Pharmacist Practitioner Infectious Diseases Clinical Pharmacist [name] for Infectious Disease

## 2024-01-10 ENCOUNTER — Telehealth: Payer: Self-pay

## 2024-01-10 ENCOUNTER — Other Ambulatory Visit: Payer: Self-pay | Admitting: Family

## 2024-01-10 DIAGNOSIS — Z1231 Encounter for screening mammogram for malignant neoplasm of breast: Secondary | ICD-10-CM

## 2024-01-10 NOTE — Telephone Encounter (Signed)
 Patient called wanting to leave a message to Cordella July, after Pus discharge from a breast lump patient called to schedule a mammogram and was told her provider will need to send a referral. Best contact number is [phone]

## 2024-01-15 ENCOUNTER — Other Ambulatory Visit: Payer: Self-pay

## 2024-01-17 ENCOUNTER — Other Ambulatory Visit: Payer: Self-pay

## 2024-01-17 ENCOUNTER — Other Ambulatory Visit (HOSPITAL_COMMUNITY): Payer: Self-pay

## 2024-01-17 NOTE — Progress Notes (Signed)
 Specialty Pharmacy Refill Coordination Note  Tiffany Ray is a 48 y.o. female assessed today regarding refills of clinic administered specialty medication(s) Cabotegravir  & Rilpivirine  (CABENUVA )   Clinic requested Courier to Provider Office   Delivery date: 01/23/24   Verified address: [address] [id number]   Medication will be filled on 01/22/2024.

## 2024-01-22 ENCOUNTER — Other Ambulatory Visit: Payer: Self-pay

## 2024-01-23 ENCOUNTER — Telehealth: Payer: Self-pay

## 2024-01-23 NOTE — Telephone Encounter (Signed)
 [facility] Patient Advocate Encounter  Patient's medications CABENUVA  have been couriered to [facility] from [facility] and will be administered at the patients appointment on 01/29/24.  Charmaine Sharps, [name] Specialty Pharmacy Patient [facility] for Infectious Disease Phone: [phone] Fax:  [phone]

## 2024-01-26 DIAGNOSIS — E119 Type 2 diabetes mellitus without complications: Secondary | ICD-10-CM | POA: Diagnosis not present

## 2024-01-26 DIAGNOSIS — H5213 Myopia, bilateral: Secondary | ICD-10-CM | POA: Diagnosis not present

## 2024-01-26 DIAGNOSIS — H5203 Hypermetropia, bilateral: Secondary | ICD-10-CM | POA: Diagnosis not present

## 2024-01-29 ENCOUNTER — Ambulatory Visit: Admitting: Family

## 2024-01-29 NOTE — Progress Notes (Deleted)
 HPI: Tiffany Ray is a 48 y.o. female who presents to the [facility] pharmacy clinic for Cabenuva  administration.  Patient Active Problem List   Diagnosis Date Noted   Acute pain of right knee 01/27/2022   BMI 38.0-38.9,adult 10/03/2021   Numbness and tingling 09/15/2020   Post-op pain 07/30/2020   Other constipation 07/30/2020   Endometriosis    Pelvic mass in female 02/23/2020   Pelvic pain in female 02/23/2020   Menorrhagia with irregular cycle 11/04/2019   Screening for cervical cancer 11/04/2019   Type 2 diabetes mellitus (HCC) 10/06/2019   HIV disease (HCC) 09/13/2018   Healthcare maintenance 09/13/2018    Patient's Medications  New Prescriptions   No medications on file  Previous Medications   CABOTEGRAVIR  & RILPIVIRINE  ER (CABENUVA ) 600 & 900 MG/3ML INJECTION    Inject 1 kit into the muscle every 2 (two) months.   METFORMIN  (GLUCOPHAGE -XR) 500 MG 24 HR TABLET    Take 1 tablet (500 mg total) by mouth daily with breakfast.  Modified Medications   No medications on file  Discontinued Medications   No medications on file    Allergies: No Known Allergies  Past Medical History: Past Medical History:  Diagnosis Date   Abnormal uterine bleeding (AUB)    Adnexal mass    Anemia    HIV infection (HCC)    Pneumonia    Pre-diabetes    Prediabetes    Type 2 diabetes mellitus (HCC) 10/06/2019    Social History: Social History   Socioeconomic History   Marital status: Single    Spouse name: Not on file   Number of children: 4   Years of education: 12   Highest education level: Not on file  Occupational History   Not on file  Tobacco Use   Smoking status: Every Day    Current packs/day: 0.25    Types: Cigarettes   Smokeless tobacco: Never   Tobacco comments:    States back and forth with quitting  Vaping Use   Vaping status: Never Used  Substance and Sexual Activity   Alcohol use: Never   Drug use: Never   Sexual activity: Not on file    Comment:  declined condoms  Other Topics Concern   Not on file  Social History Narrative   Not on file   Social Drivers of Health   Financial Resource Strain: Not on file  Food Insecurity: Not on file  Transportation Needs: Not on file  Physical Activity: Not on file  Stress: Not on file  Social Connections: Not on file    Labs: Lab Results  Component Value Date   HIV1RNAQUANT <20 DETECTED (A) 10/04/2023   HIV1RNAQUANT Not Detected 03/30/2023   HIV1RNAQUANT 28 (H) 11/28/2022   CD4TABS 784 10/04/2023   CD4TABS 758 07/13/2022   CD4TABS 760 10/03/2021    RPR and STI Lab Results  Component Value Date   LABRPR NON-REACTIVE 09/27/2022   LABRPR NON-REACTIVE 09/15/2020   LABRPR NON-REACTIVE 05/13/2019   LABRPR NON-REACTIVE 08/27/2018    STI Results GC CT  09/27/2022 11:43 AM Negative    Negative    Negative  Negative    Negative    Negative   09/23/2019 12:27 PM Negative  Negative     Hepatitis B Lab Results  Component Value Date   HEPBSAB REACTIVE (A) 06/15/2022   HEPBSAG NON-REACTIVE 08/27/2018   HEPBCAB NON-REACTIVE 08/27/2018   Hepatitis C Lab Results  Component Value Date   HEPCAB NON-REACTIVE 08/27/2018  Hepatitis A Lab Results  Component Value Date   HAV REACTIVE (A) 09/27/2022   Lipids: Lab Results  Component Value Date   CHOL 234 (H) 03/30/2023   TRIG 151 (H) 03/30/2023   HDL 60 03/30/2023   CHOLHDL 3.9 03/30/2023   LDLCALC 146 (H) 03/30/2023    TARGET DATE:  The 20th of the month  Assessment: Asli presents today for their maintenance Cabenuva  injections. Initial/past injections were tolerated well without issues. No problems with systemic effects of injections.   Administered cabotegravir  600mg /29mL in left upper outer quadrant of the gluteal muscle. Administered rilpivirine  900 mg/3mL in the right upper outer quadrant of the gluteal muscle. Monitored patient for 10 minutes after injection. Injections were tolerated well without issue. Patient  will follow up in 2 months for next injection. Will check HIV RNA today along with routine CBC, lipid panel, and A1c.  States she has been taking her metformin  daily. Discussed importance of starting a statin ****. Will start *** today.   Plan: - Cabenuva  injections administered - Check HIV RNA, lipid profile, CBC, and Hgb A1c  - Next injections scheduled for *** with Cathlyn and *** with me  - Call with any issues or questions  Alan Geralds, PharmD, CPP, BCIDP, AAHIVP Clinical Pharmacist Practitioner Infectious Diseases Clinical Pharmacist [facility]

## 2024-01-31 ENCOUNTER — Ambulatory Visit: Admitting: Pharmacist

## 2024-01-31 DIAGNOSIS — E119 Type 2 diabetes mellitus without complications: Secondary | ICD-10-CM

## 2024-01-31 DIAGNOSIS — B20 Human immunodeficiency virus [HIV] disease: Secondary | ICD-10-CM

## 2024-02-04 NOTE — Progress Notes (Signed)
 HPI: Tiffany Ray is a 48 y.o. female who presents to the [name] pharmacy clinic for Cabenuva  administration.  Patient Active Problem List   Diagnosis Date Noted   Acute pain of right knee 01/27/2022   BMI 38.0-38.9,adult 10/03/2021   Numbness and tingling 09/15/2020   Post-op pain 07/30/2020   Other constipation 07/30/2020   Endometriosis    Pelvic mass in female 02/23/2020   Pelvic pain in female 02/23/2020   Menorrhagia with irregular cycle 11/04/2019   Screening for cervical cancer 11/04/2019   Type 2 diabetes mellitus (HCC) 10/06/2019   HIV disease (HCC) 09/13/2018   Healthcare maintenance 09/13/2018    Patient's Medications  New Prescriptions   No medications on file  Previous Medications   CABOTEGRAVIR  & RILPIVIRINE  ER (CABENUVA ) 600 & 900 MG/3ML INJECTION    Inject 1 kit into the muscle every 2 (two) months.   METFORMIN  (GLUCOPHAGE -XR) 500 MG 24 HR TABLET    Take 1 tablet (500 mg total) by mouth daily with breakfast.  Modified Medications   No medications on file  Discontinued Medications   No medications on file    Allergies: No Known Allergies  Past Medical History: Past Medical History:  Diagnosis Date   Abnormal uterine bleeding (AUB)    Adnexal mass    Anemia    HIV infection (HCC)    Pneumonia    Pre-diabetes    Prediabetes    Type 2 diabetes mellitus (HCC) 10/06/2019    Social History: Social History   Socioeconomic History   Marital status: Single    Spouse name: Not on file   Number of children: 4   Years of education: 12   Highest education level: Not on file  Occupational History   Not on file  Tobacco Use   Smoking status: Every Day    Current packs/day: 0.25    Types: Cigarettes   Smokeless tobacco: Never   Tobacco comments:    States back and forth with quitting  Vaping Use   Vaping status: Never Used  Substance and Sexual Activity   Alcohol use: Never   Drug use: Never   Sexual activity: Not on file    Comment:  declined condoms  Other Topics Concern   Not on file  Social History Narrative   Not on file   Social Drivers of Health   Financial Resource Strain: Not on file  Food Insecurity: Not on file  Transportation Needs: Not on file  Physical Activity: Not on file  Stress: Not on file  Social Connections: Not on file    Labs: Lab Results  Component Value Date   HIV1RNAQUANT <20 DETECTED (A) 10/04/2023   HIV1RNAQUANT Not Detected 03/30/2023   HIV1RNAQUANT 28 (H) 11/28/2022   CD4TABS 784 10/04/2023   CD4TABS 758 07/13/2022   CD4TABS 760 10/03/2021    RPR and STI Lab Results  Component Value Date   LABRPR NON-REACTIVE 09/27/2022   LABRPR NON-REACTIVE 09/15/2020   LABRPR NON-REACTIVE 05/13/2019   LABRPR NON-REACTIVE 08/27/2018    STI Results GC CT  09/27/2022 11:43 AM Negative    Negative    Negative  Negative    Negative    Negative   09/23/2019 12:27 PM Negative  Negative     Hepatitis B Lab Results  Component Value Date   HEPBSAB REACTIVE (A) 06/15/2022   HEPBSAG NON-REACTIVE 08/27/2018   HEPBCAB NON-REACTIVE 08/27/2018   Hepatitis C Lab Results  Component Value Date   HEPCAB NON-REACTIVE 08/27/2018  Hepatitis A Lab Results  Component Value Date   HAV REACTIVE (A) 09/27/2022   Lipids: Lab Results  Component Value Date   CHOL 234 (H) 03/30/2023   TRIG 151 (H) 03/30/2023   HDL 60 03/30/2023   CHOLHDL 3.9 03/30/2023   LDLCALC 146 (H) 03/30/2023    TARGET DATE:  The 20th of the month  Assessment: Tiffany Ray presents today for their maintenance Cabenuva  injections. Initial/past injections were tolerated well without issues. No problems with systemic effects of injections.   Administered cabotegravir  600mg /9mL in left upper outer quadrant of the gluteal muscle. Administered rilpivirine  900 mg/3mL in the right upper outer quadrant of the gluteal muscle. Monitored patient for 10 minutes after injection. Injections were tolerated well without issue. Patient  will follow up in 2 months for next injection. Will check HIV RNA today along with routine CBC, lipid panel, and A1c. States she has been taking her metformin  daily.   With new research coming out by way of the REPRIEVE study regarding cardiovascular event risk reduction for PLWH, I discussed that over the 8-year trial initiation of statin (pitavastatin) therapy was shown to reduce CV disease by 35% independent of other risk factors.    The 10-year ASCVD risk score (Arnett DK, et al., 2019) is: 5.9%    Values used to calculate the score:     Age: 21 years     Sex: Female     Is Non-Hispanic African American: Yes     Diabetic: Yes     Tobacco smoker: Yes     Systolic Blood Pressure: 122 mmHg     Is BP treated: No     HDL Cholesterol: 60 mg/dL     Total Cholesterol: 234 mg/dL  We discussed the patient's 10-year CVD Risk Score to be at least moderately elevated, and patient would benefit from intervention given HIV+ and > 50 yo. Moderate-intensity statin recommended as patient has diabetes. Will start Crestor  10 mg daily.   She accepts 2/2 Shingles vaccination today.   Plan: - Cabenuva  injections administered - Check HIV RNA, lipid profile, CBC, and Hgb A1c  - Administer 2/2 Shingles vaccine - Start Crestor  10 mg daily (prescription sent)  - Next injections scheduled for 9/16 with me and 11/24 with Cathlyn (due to scheduling conflicts)  - Call with any issues or questions  Alan Geralds, PharmD, CPP, BCIDP, AAHIVP Clinical Pharmacist Practitioner Infectious Diseases Clinical Pharmacist [facility]

## 2024-02-05 ENCOUNTER — Encounter: Payer: Self-pay | Admitting: Pharmacist

## 2024-02-05 ENCOUNTER — Other Ambulatory Visit: Payer: Self-pay

## 2024-02-05 ENCOUNTER — Ambulatory Visit: Admitting: Pharmacist

## 2024-02-05 ENCOUNTER — Other Ambulatory Visit (HOSPITAL_COMMUNITY): Payer: Self-pay

## 2024-02-05 DIAGNOSIS — Z23 Encounter for immunization: Secondary | ICD-10-CM | POA: Diagnosis not present

## 2024-02-05 DIAGNOSIS — E119 Type 2 diabetes mellitus without complications: Secondary | ICD-10-CM

## 2024-02-05 DIAGNOSIS — B20 Human immunodeficiency virus [HIV] disease: Secondary | ICD-10-CM

## 2024-02-05 MED ORDER — CABOTEGRAVIR & RILPIVIRINE ER 600 & 900 MG/3ML IM SUER
1.0000 | Freq: Once | INTRAMUSCULAR | Status: AC
  Administered 2024-02-05: 1 via INTRAMUSCULAR

## 2024-02-05 MED ORDER — ROSUVASTATIN CALCIUM 10 MG PO TABS: 10.0000 mg | ORAL_TABLET | Freq: Every day | ORAL | 5 refills | Status: DC

## 2024-02-05 NOTE — Patient Instructions (Addendum)
 [facility]  Located in: [facility] Address: [address] [place] [address], [place], [place] [address] Phone: [phone]

## 2024-02-06 ENCOUNTER — Other Ambulatory Visit: Payer: Self-pay | Admitting: Family

## 2024-02-06 DIAGNOSIS — E1165 Type 2 diabetes mellitus with hyperglycemia: Secondary | ICD-10-CM

## 2024-02-07 ENCOUNTER — Ambulatory Visit: Payer: Self-pay | Admitting: Pharmacist

## 2024-02-07 LAB — HEMOGLOBIN A1C
Hgb A1c MFr Bld: 7.3 % — ABNORMAL HIGH (ref <5.7)
Mean Plasma Glucose: 163 mg/dL
eAG (mmol/L): 9 mmol/L

## 2024-02-07 LAB — HIV-1 RNA QUANT-NO REFLEX-BLD
HIV 1 RNA Quant: NOT DETECTED {copies}/mL
HIV-1 RNA Quant, Log: NOT DETECTED {Log_copies}/mL

## 2024-02-07 LAB — CBC
HCT: 39.1 % (ref 35.0–45.0)
Hemoglobin: 13.1 g/dL (ref 11.7–15.5)
MCH: 28.8 pg (ref 27.0–33.0)
MCHC: 33.5 g/dL (ref 32.0–36.0)
MCV: 85.9 fL (ref 80.0–100.0)
MPV: 11.5 fL (ref 7.5–12.5)
Platelets: 292 Thousand/uL (ref 140–400)
RBC: 4.55 Million/uL (ref 3.80–5.10)
RDW: 13.1 % (ref 11.0–15.0)
WBC: 5.5 Thousand/uL (ref 3.8–10.8)

## 2024-02-07 LAB — LIPID PANEL
Cholesterol: 258 mg/dL — ABNORMAL HIGH (ref <200)
HDL: 49 mg/dL — ABNORMAL LOW (ref >=50)
LDL Cholesterol (Calc): 168 mg/dL — ABNORMAL HIGH
Non-HDL Cholesterol (Calc): 209 mg/dL — ABNORMAL HIGH (ref <130)
Total CHOL/HDL Ratio: 5.3 (calc) — ABNORMAL HIGH (ref <5.0)
Triglycerides: 249 mg/dL — ABNORMAL HIGH (ref <150)

## 2024-03-18 ENCOUNTER — Other Ambulatory Visit: Payer: Self-pay | Admitting: Pharmacist

## 2024-03-18 ENCOUNTER — Other Ambulatory Visit: Payer: Self-pay

## 2024-03-18 ENCOUNTER — Other Ambulatory Visit (HOSPITAL_COMMUNITY): Payer: Self-pay

## 2024-03-18 DIAGNOSIS — B20 Human immunodeficiency virus [HIV] disease: Secondary | ICD-10-CM

## 2024-03-18 MED ORDER — CABOTEGRAVIR & RILPIVIRINE ER 600 & 900 MG/3ML IM SUER
1.0000 | INTRAMUSCULAR | 5 refills | Status: AC
  Filled 2024-03-18: qty 6, 60d supply, fill #0
  Filled 2024-05-28: qty 6, 60d supply, fill #1
  Filled 2024-07-28: qty 6, 60d supply, fill #2

## 2024-03-18 NOTE — Progress Notes (Signed)
 Specialty Pharmacy Refill Coordination Note  Tiffany Ray is a 48 y.o. female assessed today regarding refills of clinic administered specialty medication(s) Cabotegravir  & Rilpivirine  (CABENUVA )   Clinic requested Courier to Provider Office   Delivery date: 03/26/24   Verified address: [address] [place] [address]   Medication will be filled on 03/25/24.

## 2024-03-25 ENCOUNTER — Other Ambulatory Visit: Payer: Self-pay

## 2024-03-26 ENCOUNTER — Telehealth: Payer: Self-pay

## 2024-03-26 NOTE — Telephone Encounter (Signed)
 [facility] Patient Advocate Encounter  Patient's medications CABENUVA  have been couriered to [facility] from [facility] and will be administered at the patients appointment on 04/01/24.  Charmaine Sharps, [name] Specialty Pharmacy Patient [facility] for Infectious Disease Phone: [phone] Fax:  [phone]

## 2024-03-28 ENCOUNTER — Other Ambulatory Visit: Payer: Self-pay | Admitting: Family

## 2024-03-28 DIAGNOSIS — Z1231 Encounter for screening mammogram for malignant neoplasm of breast: Secondary | ICD-10-CM

## 2024-04-01 ENCOUNTER — Encounter: Admitting: Pharmacist

## 2024-04-01 NOTE — Progress Notes (Unsigned)
   HPI: Tiffany Ray is a 48 y.o. female who presents to the [facility] pharmacy clinic for Cabenuva  administration.  Patient Active Problem List   Diagnosis Date Noted   Acute pain of right knee 01/27/2022   BMI 38.0-38.9,adult 10/03/2021   Numbness and tingling 09/15/2020   Post-op pain 07/30/2020   Other constipation 07/30/2020   Endometriosis    Pelvic mass in female 02/23/2020   Pelvic pain in female 02/23/2020   Menorrhagia with irregular cycle 11/04/2019   Screening for cervical cancer 11/04/2019   Type 2 diabetes mellitus (HCC) 10/06/2019   HIV disease (HCC) 09/13/2018   Healthcare maintenance 09/13/2018    Patient's Medications  New Prescriptions   No medications on file  Previous Medications   CABOTEGRAVIR  & RILPIVIRINE  ER (CABENUVA ) 600 & 900 MG/3ML INJECTION    Inject 1 kit into the muscle every 2 (two) months.   METFORMIN  (GLUCOPHAGE -XR) 500 MG 24 HR TABLET    TAKE 1 TABLET(500 MG) BY MOUTH DAILY WITH BREAKFAST   ROSUVASTATIN  (CRESTOR ) 10 MG TABLET    Take 1 tablet (10 mg total) by mouth daily.  Modified Medications   No medications on file  Discontinued Medications   No medications on file    Allergies: No Known Allergies  Labs: Lab Results  Component Value Date   HIV1RNAQUANT NOT DETECTED 02/05/2024   HIV1RNAQUANT <20 DETECTED (A) 10/04/2023   HIV1RNAQUANT Not Detected 03/30/2023   CD4TABS 784 10/04/2023   CD4TABS 758 07/13/2022   CD4TABS 760 10/03/2021    RPR and STI Lab Results  Component Value Date   LABRPR NON-REACTIVE 09/27/2022   LABRPR NON-REACTIVE 09/15/2020   LABRPR NON-REACTIVE 05/13/2019   LABRPR NON-REACTIVE 08/27/2018    STI Results GC CT  09/27/2022 11:43 AM Negative    Negative    Negative  Negative    Negative    Negative   09/23/2019 12:27 PM Negative  Negative     Hepatitis B Lab Results  Component Value Date   HEPBSAB REACTIVE (A) 06/15/2022   HEPBSAG NON-REACTIVE 08/27/2018   HEPBCAB NON-REACTIVE 08/27/2018    Hepatitis C Lab Results  Component Value Date   HEPCAB NON-REACTIVE 08/27/2018   Hepatitis A Lab Results  Component Value Date   HAV REACTIVE (A) 09/27/2022   Lipids: Lab Results  Component Value Date   CHOL 258 (H) 02/05/2024   TRIG 249 (H) 02/05/2024   HDL 49 (L) 02/05/2024   CHOLHDL 5.3 (H) 02/05/2024   LDLCALC 168 (H) 02/05/2024    TARGET DATE: The 20th  Assessment: Tiffany Ray presents today for her maintenance Cabenuva  injections. Past injections were tolerated well without issues. Last HIV RNA was non-detectable on 02/05/24. She politely declined STI screening due to not being sexually active. Last seen in clinic in July, where rosuvastatin  20 mg daily was initiated. She reports adherence and denies side effects or concerns. Discussed her A1c results and verified adherence with new metformin  prescription. Due for influenza vaccination today. She will defer until next visit.   Administered cabotegravir  600mg /81mL in left upper outer quadrant of the gluteal muscle. Administered rilpivirine  900 mg/3mL in the right upper outer quadrant of the gluteal muscle. No issues with injections. She will follow up in 2 months for next set of injections.  Plan: Cabenuva  injections administered Next injections scheduled with Cathlyn on 06/09/24 and with PharmD on 08/07/23 Call with any issues or questions  Woodie Jock, PharmD PGY1 Pharmacy Resident  04/02/2024

## 2024-04-01 NOTE — Progress Notes (Unsigned)
   HPI: Tiffany Ray is a 48 y.o. female who presents to the [facility] pharmacy clinic for Cabenuva  administration.  Patient Active Problem List   Diagnosis Date Noted   Acute pain of right knee 01/27/2022   BMI 38.0-38.9,adult 10/03/2021   Numbness and tingling 09/15/2020   Post-op pain 07/30/2020   Other constipation 07/30/2020   Endometriosis    Pelvic mass in female 02/23/2020   Pelvic pain in female 02/23/2020   Menorrhagia with irregular cycle 11/04/2019   Screening for cervical cancer 11/04/2019   Type 2 diabetes mellitus (HCC) 10/06/2019   HIV disease (HCC) 09/13/2018   Healthcare maintenance 09/13/2018    Patient's Medications  New Prescriptions   No medications on file  Previous Medications   CABOTEGRAVIR  & RILPIVIRINE  ER (CABENUVA ) 600 & 900 MG/3ML INJECTION    Inject 1 kit into the muscle every 2 (two) months.   METFORMIN  (GLUCOPHAGE -XR) 500 MG 24 HR TABLET    TAKE 1 TABLET(500 MG) BY MOUTH DAILY WITH BREAKFAST   ROSUVASTATIN  (CRESTOR ) 10 MG TABLET    Take 1 tablet (10 mg total) by mouth daily.  Modified Medications   No medications on file  Discontinued Medications   No medications on file    Allergies: No Known Allergies  Labs: Lab Results  Component Value Date   HIV1RNAQUANT NOT DETECTED 02/05/2024   HIV1RNAQUANT <20 DETECTED (A) 10/04/2023   HIV1RNAQUANT Not Detected 03/30/2023   CD4TABS 784 10/04/2023   CD4TABS 758 07/13/2022   CD4TABS 760 10/03/2021    RPR and STI Lab Results  Component Value Date   LABRPR NON-REACTIVE 09/27/2022   LABRPR NON-REACTIVE 09/15/2020   LABRPR NON-REACTIVE 05/13/2019   LABRPR NON-REACTIVE 08/27/2018    STI Results GC CT  09/27/2022 11:43 AM Negative    Negative    Negative  Negative    Negative    Negative   09/23/2019 12:27 PM Negative  Negative     Hepatitis B Lab Results  Component Value Date   HEPBSAB REACTIVE (A) 06/15/2022   HEPBSAG NON-REACTIVE 08/27/2018   HEPBCAB NON-REACTIVE 08/27/2018    Hepatitis C Lab Results  Component Value Date   HEPCAB NON-REACTIVE 08/27/2018   Hepatitis A Lab Results  Component Value Date   HAV REACTIVE (A) 09/27/2022   Lipids: Lab Results  Component Value Date   CHOL 258 (H) 02/05/2024   TRIG 249 (H) 02/05/2024   HDL 49 (L) 02/05/2024   CHOLHDL 5.3 (H) 02/05/2024   LDLCALC 168 (H) 02/05/2024    TARGET DATE: The 20th  Assessment: Tiffany Ray presents today for her maintenance Cabenuva  injections. Past injections were tolerated well without issues. Last HIV RNA was undetectable in July. Doing well with no issues today.  Administered cabotegravir  600mg /70mL in left upper outer quadrant of the gluteal muscle. Administered rilpivirine  900 mg/3mL in the right upper outer quadrant of the gluteal muscle. No issues with injections. *** will follow up in 2 months for next set of injections.  Plan: - Cabenuva  injections administered - Next injections scheduled for *** - Call with any issues or questions  Izetta Carl, PharmD PGY1 Pharmacy Resident

## 2024-04-02 ENCOUNTER — Ambulatory Visit (INDEPENDENT_AMBULATORY_CARE_PROVIDER_SITE_OTHER): Admitting: Pharmacist

## 2024-04-02 ENCOUNTER — Ambulatory Visit
Admission: RE | Admit: 2024-04-02 | Discharge: 2024-04-02 | Disposition: A | Discharge status: Home / Self Care | Source: Ambulatory Visit | Attending: Family | Admitting: Family

## 2024-04-02 ENCOUNTER — Ambulatory Visit

## 2024-04-02 ENCOUNTER — Other Ambulatory Visit: Payer: Self-pay

## 2024-04-02 DIAGNOSIS — B20 Human immunodeficiency virus [HIV] disease: Secondary | ICD-10-CM

## 2024-04-02 DIAGNOSIS — Z1231 Encounter for screening mammogram for malignant neoplasm of breast: Secondary | ICD-10-CM

## 2024-04-02 MED ORDER — CABOTEGRAVIR & RILPIVIRINE ER 600 & 900 MG/3ML IM SUER
1.0000 | Freq: Once | INTRAMUSCULAR | Status: AC
  Administered 2024-04-02: 1 via INTRAMUSCULAR

## 2024-04-07 ENCOUNTER — Other Ambulatory Visit: Payer: Self-pay | Admitting: Family

## 2024-04-07 DIAGNOSIS — R928 Other abnormal and inconclusive findings on diagnostic imaging of breast: Secondary | ICD-10-CM

## 2024-04-10 ENCOUNTER — Ambulatory Visit
Admission: RE | Admit: 2024-04-10 | Discharge: 2024-04-10 | Disposition: A | Discharge status: Home / Self Care | Source: Ambulatory Visit | Attending: Family | Admitting: Family

## 2024-04-10 ENCOUNTER — Inpatient Hospital Stay
Admission: RE | Admit: 2024-04-10 | Discharge: 2024-04-10 | Discharge status: Home / Self Care | Source: Ambulatory Visit | Attending: Family

## 2024-04-10 ENCOUNTER — Other Ambulatory Visit: Payer: Self-pay | Admitting: Family

## 2024-04-10 ENCOUNTER — Ambulatory Visit: Admitting: Family Medicine

## 2024-04-10 DIAGNOSIS — R928 Other abnormal and inconclusive findings on diagnostic imaging of breast: Secondary | ICD-10-CM

## 2024-04-10 DIAGNOSIS — N6489 Other specified disorders of breast: Secondary | ICD-10-CM | POA: Diagnosis not present

## 2024-04-10 DIAGNOSIS — R599 Enlarged lymph nodes, unspecified: Secondary | ICD-10-CM

## 2024-04-10 NOTE — Progress Notes (Deleted)
   LILLETTE Ileana Collet, PhD, LAT, ATC acting as a scribe for Artist Lloyd, MD.  Tiffany Ray is a 48 y.o. female who presents to [facility] today for exacerbation of her R knee pain. Pt was last seen by Dr. Lloyd on 10/02/22 and was referred to PT and MRI ordered.  Today, pt reports ***  Dx testing: 08/18/2022 R knee XR and culture   Pertinent review of systems: ***  Relevant historical information: ***   Exam:  LMP 06/05/2020  General: Well Developed, well nourished, and in no acute distress.   MSK: ***    Lab and Radiology Results No results found for this or any previous visit (from the past 72 hours). No results found.     Assessment and Plan: 48 y.o. female with ***   PDMP not reviewed this encounter. No orders of the defined types were placed in this encounter.  No orders of the defined types were placed in this encounter.    Discussed warning signs or symptoms. Please see discharge instructions. Patient expresses understanding.   ***

## 2024-04-16 ENCOUNTER — Inpatient Hospital Stay: Admission: RE | Admit: 2024-04-16 | Source: Ambulatory Visit

## 2024-04-18 ENCOUNTER — Other Ambulatory Visit (HOSPITAL_COMMUNITY)
Admission: RE | Admit: 2024-04-18 | Discharge: 2024-04-18 | Disposition: A | Discharge status: Home / Self Care | Source: Ambulatory Visit | Attending: Family | Admitting: Family

## 2024-04-18 ENCOUNTER — Ambulatory Visit
Admission: RE | Admit: 2024-04-18 | Discharge: 2024-04-18 | Disposition: A | Discharge status: Home / Self Care | Source: Ambulatory Visit | Attending: Family | Admitting: Family

## 2024-04-18 DIAGNOSIS — R599 Enlarged lymph nodes, unspecified: Secondary | ICD-10-CM

## 2024-04-18 DIAGNOSIS — I898 Other specified noninfective disorders of lymphatic vessels and lymph nodes: Secondary | ICD-10-CM | POA: Diagnosis not present

## 2024-04-22 LAB — SURGICAL PATHOLOGY

## 2024-04-24 ENCOUNTER — Ambulatory Visit: Admitting: Family Medicine

## 2024-04-30 ENCOUNTER — Ambulatory Visit: Admitting: Family Medicine

## 2024-04-30 ENCOUNTER — Other Ambulatory Visit: Payer: Self-pay

## 2024-04-30 ENCOUNTER — Ambulatory Visit

## 2024-04-30 VITALS — BP 144/86 | HR 83 | Ht 70.0 in | Wt 269.0 lb

## 2024-04-30 DIAGNOSIS — M25561 Pain in right knee: Secondary | ICD-10-CM

## 2024-04-30 DIAGNOSIS — G8929 Other chronic pain: Secondary | ICD-10-CM

## 2024-04-30 NOTE — Progress Notes (Signed)
 LILLETTE Ileana Collet, PhD, LAT, ATC acting as a scribe for Artist Lloyd, MD.  Tiffany Ray is a 48 y.o. female who presents to [facility] at [facility] today for exacerbation of her R knee pain. Pt was last seen by Dr. Lloyd on 10/02/22 and was referred to PT and MRI ordered.  Today, pt reports she is unsure about MRI on her knee. R knee pain returned over the summer. She notes doing a lot of walking for work. R knee is very painful. +swelling.  She notes significant discomfort at work.  She notes she needs a work note for light duty and allow her to wear larger shoes at work including Clorox.  Dx testing: 08/18/2022 R knee XR and culture   Pertinent review of systems: No fevers or chills  Relevant historical information: Diabetes and controlled HIV.   Exam:  BP (!) 144/86   Pulse 83   Ht 5' 10 (1.778 m)   Wt 269 lb (122 kg)   LMP 06/05/2020   SpO2 95%   BMI 38.60 kg/m  General: Well Developed, well nourished, and in no acute distress.   MSK: Right knee moderate effusion normal-appearing otherwise normal motion.  Bilateral 1+ pitting edema lower extremities.    Lab and Radiology Results  Procedure: Real-time Ultrasound Guided aspiration and injection of right knee Device: Philips Affiniti 50G/GE Logiq Images permanently stored and available for review in PACS Verbal informed consent obtained.  Discussed risks and benefits of procedure. Warned about infection, bleeding, hyperglycemia damage to structures among others. Patient expresses understanding and agreement Time-out conducted.   Noted no overlying erythema, induration, or other signs of local infection.   Skin prepped in a sterile fashion.   Local anesthesia: Topical Ethyl chloride.   With sterile technique and under real time ultrasound guidance: 2 mL of lidocaine  injected into subcutaneous tissue achieving good anesthesia. Skin again sterilized with isopropyl alcohol. 18-gauge needle was used to  access the knee joint effusion. 35 mL of clear yellow fluid was aspirated  Syringe was exchanged and 40 mg of Kenalog and 2 L of Marcaine  were injected into the now decompressed knee joint. Patient tolerated procedure well.   Completed without difficulty   Pain immediately resolved suggesting accurate placement of the medication.   Advised to call if fevers/chills, erythema, induration, drainage, or persistent bleeding.   Images permanently stored and available for review in the ultrasound unit.  Impression: Technically successful ultrasound guided injection.         Assessment and Plan: 48 y.o. female with right knee pain and swelling.  Unclear etiology.  This is a chronic ongoing problem.  Previous x-ray showed only minimal arthritis.  She is having significant discomfort that is interfering with her quality of life.  Plan for MRI as originally planned last year.  Aspiration injection today.   PDMP not reviewed this encounter. Orders Placed This Encounter  Procedures   US  LIMITED JOINT SPACE STRUCTURES LOW RIGHT(NO LINKED CHARGES)    Reason for Exam (SYMPTOM  OR DIAGNOSIS REQUIRED):   right knee pain    Preferred imaging location?:   [facility]   MR Knee Right Wo Contrast    Standing Status:   Future    Expiration Date:   04/30/2025    What is the patient's sedation requirement?:   No Sedation    Does the patient have a pacemaker or implanted devices?:   No    Preferred imaging location?:  [facility] (table limit-550lbs)   No orders of the defined types were placed in this encounter.    Discussed warning signs or symptoms. Please see discharge instructions. Patient expresses understanding.   The above documentation has been reviewed and is accurate and complete Artist Lloyd, M.D.

## 2024-04-30 NOTE — Patient Instructions (Signed)
Thank you for coming in today.  Call or go to the ER if you develop a large red swollen joint with extreme pain or oozing puss.   You should hear from MRI scheduling within 1 week. If you do not hear please let me know.

## 2024-05-01 ENCOUNTER — Telehealth: Payer: Self-pay | Admitting: Family Medicine

## 2024-05-01 NOTE — Addendum Note (Signed)
 Addended by: GEROME CROME R on: 05/01/2024 09:06 AM   Modules accepted: Orders

## 2024-05-01 NOTE — Telephone Encounter (Signed)
 Pt employer requesting more specific work note.  Please include:  Weight/Lifting restrictions and for how long  Standing limitations  Please mention you have ordered an MRI  Pt can retrieve from MyChart.

## 2024-05-05 ENCOUNTER — Telehealth: Payer: Self-pay

## 2024-05-05 NOTE — Telephone Encounter (Signed)
 Insurance is asking for more information or they will deny the imaging request, however I do not have this information to give.  Missing Clinical: Dates and duration of treatment (active conservative care such as physical therapy, chiropractic, osteopathic manipulative treatment or home exercise program) showing 4 weeks' worth within the past 6 months.  Missing Clinical: Latest visit notes with exam findings that showed problem with the joint (positive orthopedic sign consistent with laxity/instability).  I only have the recent office visit notes and then some from a year ago, with patients type of insurance this will not get approved

## 2024-05-05 NOTE — Telephone Encounter (Signed)
 Please see below for denial reasons and advise of next steps.

## 2024-05-06 NOTE — Telephone Encounter (Signed)
 Please inform patient and ask her the relevant questions and I can addend my note.

## 2024-05-06 NOTE — Telephone Encounter (Signed)
 Spoke with patient, she advises that the letter looks good. Letter put on office letterhead and sent to pt via MyChart.

## 2024-05-09 ENCOUNTER — Ambulatory Visit (HOSPITAL_BASED_OUTPATIENT_CLINIC_OR_DEPARTMENT_OTHER)

## 2024-05-10 ENCOUNTER — Other Ambulatory Visit: Payer: Self-pay | Admitting: Pharmacist

## 2024-05-10 DIAGNOSIS — E1165 Type 2 diabetes mellitus with hyperglycemia: Secondary | ICD-10-CM

## 2024-05-16 ENCOUNTER — Other Ambulatory Visit (HOSPITAL_COMMUNITY): Payer: Self-pay

## 2024-05-28 ENCOUNTER — Other Ambulatory Visit: Payer: Self-pay

## 2024-05-28 ENCOUNTER — Other Ambulatory Visit (HOSPITAL_COMMUNITY): Payer: Self-pay

## 2024-05-28 NOTE — Progress Notes (Signed)
 Specialty Pharmacy Refill Coordination Note  Tiffany Ray is a 48 y.o. female assessed today regarding refills of clinic administered specialty medication(s) Cabotegravir  & Rilpivirine  (CABENUVA )   Clinic requested Courier to Provider Office   Delivery date: 06/04/24   Verified address: [address] [place] [address]   Medication will be filled on 06/03/24.

## 2024-06-03 ENCOUNTER — Other Ambulatory Visit: Payer: Self-pay

## 2024-06-04 ENCOUNTER — Telehealth: Payer: Self-pay

## 2024-06-04 NOTE — Telephone Encounter (Signed)
 [facility] Patient Advocate Encounter  Patient's medications CABENUVA  have been couriered to [facility] from [facility] and will be administered at the patients appointment on 06/09/24.  Charmaine Sharps, [name] Specialty Pharmacy Patient [facility] for Infectious Disease Phone: [phone] Fax:  [phone]

## 2024-06-09 ENCOUNTER — Encounter: Payer: Self-pay | Admitting: Family

## 2024-06-09 ENCOUNTER — Other Ambulatory Visit: Payer: Self-pay

## 2024-06-09 ENCOUNTER — Ambulatory Visit (INDEPENDENT_AMBULATORY_CARE_PROVIDER_SITE_OTHER): Admitting: Family

## 2024-06-09 VITALS — BP 148/106 | HR 76 | Temp 98.7°F | Wt 268.0 lb

## 2024-06-09 DIAGNOSIS — E1165 Type 2 diabetes mellitus with hyperglycemia: Secondary | ICD-10-CM

## 2024-06-09 DIAGNOSIS — Z0000 Encounter for general adult medical examination without abnormal findings: Secondary | ICD-10-CM

## 2024-06-09 DIAGNOSIS — Z23 Encounter for immunization: Secondary | ICD-10-CM

## 2024-06-09 DIAGNOSIS — B079 Viral wart, unspecified: Secondary | ICD-10-CM | POA: Insufficient documentation

## 2024-06-09 DIAGNOSIS — Z9189 Other specified personal risk factors, not elsewhere classified: Secondary | ICD-10-CM | POA: Insufficient documentation

## 2024-06-09 DIAGNOSIS — B20 Human immunodeficiency virus [HIV] disease: Secondary | ICD-10-CM | POA: Diagnosis not present

## 2024-06-09 DIAGNOSIS — Z1211 Encounter for screening for malignant neoplasm of colon: Secondary | ICD-10-CM

## 2024-06-09 MED ORDER — ROSUVASTATIN CALCIUM 10 MG PO TABS
10.0000 mg | ORAL_TABLET | Freq: Every day | ORAL | 1 refills | Status: AC
Start: 2024-06-09 — End: ?

## 2024-06-09 MED ORDER — CABOTEGRAVIR & RILPIVIRINE ER 600 & 900 MG/3ML IM SUER
1.0000 | Freq: Once | INTRAMUSCULAR | Status: AC
  Administered 2024-06-09: 1 via INTRAMUSCULAR

## 2024-06-09 NOTE — Assessment & Plan Note (Signed)
 Tiffany Ray is at increased risk for cardiovascular disease with ASCVD risk score of 20.5% secondary to diabetes and tobacco use.  She is currently on rosuvastatin  to reduce cardiovascular disease risk and HIV associated inflammation.  Encouraged lifestyle management of comorbid conditions and tobacco use.

## 2024-06-09 NOTE — Assessment & Plan Note (Signed)
 Wart located on left middle finger. Referral placed to dermatology for removal. Encouraged OTC medications while awaiting appointment in the interim.

## 2024-06-09 NOTE — Assessment & Plan Note (Signed)
 Discussed importance of safe sexual practice and condom use. Condoms and site specific STD testing offered.  Vaccinations reviewed and influenza updated following counseling Due for colon cancer screening with referral placed to gastroenterology for colonoscopy Will schedule Pap smear for cervical cancer screening Due for routine dental care which she will schedule independently.

## 2024-06-09 NOTE — Patient Instructions (Addendum)
 Nice to see you.  We will check your lab work today.  Continue to take your medication daily as prescribed.  Refills have been sent to the pharmacy.  Plan for follow up in 6 months or sooner if needed with lab work on the same day and pharmacy provider in between.   Have a great day and stay safe!

## 2024-06-09 NOTE — Progress Notes (Signed)
 Brief Narrative   Patient ID: Tiffany Ray, female    DOB: 05-11-1976, 48 y.o.   MRN: [id number]  Tiffany Ray is a 48 y/o AA female diagnosed with HIV in 2009/10 with risk factor of heterosexual contact. Initial viral load and CD4 count are unknown. No history of opportunistic infection. Initial clinic lab work with viral load of 214,000 and CD4 count of 380. Genotype with V106M.  [id number] negative. Previous ART regimen of Atripla and Biktarvy.     Subjective:   Chief Complaint  Patient presents with   Follow-up    B20    HPI:  Tiffany Ray is a 48 y.o. female with HIV disease last seen by Alan Geralds, PharmD, CPP on 04/02/24 with good adherence and tolerance to Cabenuva . Viral load was undetectable and CD4 count. Hemoglobin A1c was 7.3 with current dose of metformin . ASCVD risk of 18.2%. Target date is the 20th. Here today for follow up.   Tiffany Ray has been doing well since her last office visit and continues to receive Cabenuva  with no adverse side effects.  Recent mammogram completed with biopsy and findings with no malignancy.  Has a wart located on her left middle finger and requesting referral to dermatology for removal.  Does not currently check blood sugars.  Continues to work and has stable housing, transportation, and access to food.  No problems obtaining medication from the pharmacy and covered by Medicaid.  Working with sports medicine for her knee pain and awaiting MRI.  Condoms and site-specific STD testing offered.  Healthcare maintenance reviewed.  Denies fevers, chills, night sweats, headaches, changes in vision, neck pain/stiffness, nausea, diarrhea, vomiting, lesions, excessive hunger, excessive thirst, excessive urination or rashes.  Lab Results  Component Value Date   CD4TCELL 41 10/04/2023   CD4TABS 784 10/04/2023   Lab Results  Component Value Date   HIV1RNAQUANT NOT DETECTED 02/05/2024     No Known Allergies    Outpatient Medications Prior to  Visit  Medication Sig Dispense Refill   metFORMIN  (GLUCOPHAGE -XR) 500 MG 24 hr tablet TAKE 1 TABLET(500 MG) BY MOUTH DAILY WITH BREAKFAST 90 tablet 0   rosuvastatin  (CRESTOR ) 10 MG tablet Take 1 tablet (10 mg total) by mouth daily. 30 tablet 5   cabotegravir  & rilpivirine  ER (CABENUVA ) 600 & 900 MG/3ML injection Inject 1 kit into the muscle every 2 (two) months. 6 mL 5   No facility-administered medications prior to visit.     Past Medical History:  Diagnosis Date   Abnormal uterine bleeding (AUB)    Adnexal mass    Anemia    HIV infection (HCC)    Pneumonia    Pre-diabetes    Prediabetes    Type 2 diabetes mellitus (HCC) 10/06/2019     Past Surgical History:  Procedure Laterality Date   ECTOPIC PREGNANCY SURGERY     ROBOTIC ASSISTED SALPINGO OOPHERECTOMY Bilateral 07/08/2020   Procedure: XI ROBOTIC ASSISTED LEFT SALPINGO OOPHORECTOMY, RIGHT SALPINGECTOMY;  Surgeon: Tiffany Comer SAUNDERS, MD;  Location: [facility];  Service: Gynecology;  Laterality: Bilateral;   TUBAL LIGATION          Review of Systems  Constitutional:  Negative for chills, diaphoresis, fatigue and fever.  Respiratory:  Negative for cough, chest tightness, shortness of breath and wheezing.   Cardiovascular:  Negative for chest pain.  Gastrointestinal:  Negative for abdominal pain, diarrhea, nausea and vomiting.  Skin:        Wart located on left middle finger  Objective:   BP (!) 148/106   Pulse 76   Temp 98.7 F (37.1 C) (Oral)   Wt 268 lb (121.6 kg)   LMP 06/05/2020   SpO2 97%   BMI 38.45 kg/m  Nursing note and vital signs reviewed.  Physical Exam Constitutional:      General: She is not in acute distress.    Appearance: She is well-developed.  Cardiovascular:     Rate and Rhythm: Normal rate and regular rhythm.     Heart sounds: Normal heart sounds.  Pulmonary:     Effort: Pulmonary effort is normal.     Breath sounds: Normal breath sounds.  Skin:    General:  Skin is warm and dry.     Comments: Verruca vulgaris appearing lesion located on left middle finger.  Neurological:     Mental Status: She is alert and oriented to person, place, and time.          10/04/2023    1:41 PM 03/30/2023   11:20 AM 07/13/2022    3:03 PM 10/03/2021    1:38 PM 03/29/2021   11:29 AM  Depression screen PHQ 2/9  Decreased Interest 0 0 0 0 0  Down, Depressed, Hopeless 0 0 0 0 0  PHQ - 2 Score 0 0 0 0 0         No data to display           The 10-year ASCVD risk score (Arnett DK, et al., 2019) is: 20.5%   Values used to calculate the score:     Age: 48 years     Clincally relevant sex: Female     Is Non-Hispanic African American: Yes     Diabetic: Yes     Tobacco smoker: Yes     Systolic Blood Pressure: 148 mmHg     Is BP treated: No     HDL Cholesterol: 49 mg/dL     Total Cholesterol: 258 mg/dL      Assessment & Plan:    Patient Active Problem List   Diagnosis Date Noted   At increased risk for cardiovascular disease 06/09/2024   Wart of hand 06/09/2024   Chronic pain of right knee 01/27/2022   BMI 38.0-38.9,adult 10/03/2021   Numbness and tingling 09/15/2020   Post-op pain 07/30/2020   Other constipation 07/30/2020   Endometriosis    Pelvic mass in female 02/23/2020   Pelvic pain in female 02/23/2020   Menorrhagia with irregular cycle 11/04/2019   Screening for cervical cancer 11/04/2019   Type 2 diabetes mellitus (HCC) 10/06/2019   HIV disease (HCC) 09/13/2018   Healthcare maintenance 09/13/2018     Problem List Items Addressed This Visit       Endocrine   Type 2 diabetes mellitus (HCC)   Most recent hemoglobin A1c of 7.2% down from 8% with current dose of metformin  and no adverse side effects.  Check hemoglobin A1c.  Encouraged to monitor carbohydrate intake.  Will refer to podiatry for diabetic footcare.  Completed eye exam 2 months ago.  Denies excessive hunger, thirst, or urination.  Continue current dose of metformin  pending  A1c results.      Relevant Medications   rosuvastatin  (CRESTOR ) 10 MG tablet   Other Relevant Orders   Ambulatory referral to Podiatry   HgB A1c     Musculoskeletal and Integument   Wart of hand   Wart located on left middle finger. Referral placed to dermatology for removal. Encouraged OTC medications while awaiting appointment in  the interim.       Relevant Orders   Ambulatory referral to Dermatology     Other   HIV disease [facility]) - Primary   Ms. Chacko continues to have well-controlled virus with good adherence and tolerance to Cabenuva .  Reviewed previous lab work and discussed plan of care and U equals U.  Social determinants of health reviewed with no interventions indicated.  Check blood work.  Every 2 month injection of Cabenuva  provided without complication.  Plan for follow-up in 6 months or sooner if needed with pharmacy providers in between.      Relevant Medications   rosuvastatin  (CRESTOR ) 10 MG tablet   Other Relevant Orders   Comprehensive metabolic panel with GFR   HIV-1 RNA quant-no reflex-bld   T-helper cell (CD4)- ([facility] clinic only)   Flu vaccine trivalent PF, 6mos and older(Flulaval,Afluria,Fluarix,Fluzone) (Completed)   Healthcare maintenance   Discussed importance of safe sexual practice and condom use. Condoms and site specific STD testing offered.  Vaccinations reviewed and influenza updated following counseling Due for colon cancer screening with referral placed to gastroenterology for colonoscopy Will schedule Pap smear for cervical cancer screening Due for routine dental care which she will schedule independently.      At increased risk for cardiovascular disease   Ms. Pirro is at increased risk for cardiovascular disease with ASCVD risk score of 20.5% secondary to diabetes and tobacco use.  She is currently on rosuvastatin  to reduce cardiovascular disease risk and HIV associated inflammation.  Encouraged lifestyle management of comorbid conditions  and tobacco use.      Other Visit Diagnoses       Encounter for screening for malignant neoplasm of colon       Relevant Orders   Ambulatory referral to Gastroenterology     Need for influenza vaccination       Relevant Orders   Flu vaccine trivalent PF, 6mos and older(Flulaval,Afluria,Fluarix,Fluzone) (Completed)        I am having Susen M. Minnehan maintain her cabotegravir  & rilpivirine  ER, metFORMIN , and rosuvastatin . We administered cabotegravir  & rilpivirine  ER.   Meds ordered this encounter  Medications   rosuvastatin  (CRESTOR ) 10 MG tablet    Sig: Take 1 tablet (10 mg total) by mouth daily.    Dispense:  90 tablet    Refill:  1    Supervising Provider:   SNIDER, CYNTHIA [[id number]]   cabotegravir  & rilpivirine  ER (CABENUVA ) 600 & 900 MG/3ML injection 1 kit     Follow-up: Return in about 6 months (around 12/07/2024). or sooner if needed.    Cathlyn July, MSN, FNP-C Nurse Practitioner [name] Main number: [phone]

## 2024-06-09 NOTE — Assessment & Plan Note (Signed)
 Ms. Turney continues to have well-controlled virus with good adherence and tolerance to Cabenuva .  Reviewed previous lab work and discussed plan of care and U equals U.  Social determinants of health reviewed with no interventions indicated.  Check blood work.  Every 2 month injection of Cabenuva  provided without complication.  Plan for follow-up in 6 months or sooner if needed with pharmacy providers in between.

## 2024-06-09 NOTE — Assessment & Plan Note (Signed)
 Most recent hemoglobin A1c of 7.2% down from 8% with current dose of metformin  and no adverse side effects.  Check hemoglobin A1c.  Encouraged to monitor carbohydrate intake.  Will refer to podiatry for diabetic footcare.  Completed eye exam 2 months ago.  Denies excessive hunger, thirst, or urination.  Continue current dose of metformin  pending A1c results.

## 2024-06-10 LAB — T-HELPER CELL (CD4) - (RCID CLINIC ONLY)
CD4 % Helper T Cell: 44 % (ref 33–65)
CD4 T Cell Abs: 900 /uL (ref 400–1790)

## 2024-06-12 LAB — COMPREHENSIVE METABOLIC PANEL WITH GFR
AG Ratio: 1.1 (calc) (ref 1.0–2.5)
ALT: 16 U/L (ref 6–29)
AST: 16 U/L (ref 10–35)
Albumin: 3.9 g/dL (ref 3.6–5.1)
Alkaline phosphatase (APISO): 67 U/L (ref 31–125)
BUN/Creatinine Ratio: 11 (calc) (ref 6–22)
BUN: 12 mg/dL (ref 7–25)
CO2: 29 mmol/L (ref 20–32)
Calcium: 9.5 mg/dL (ref 8.6–10.2)
Chloride: 107 mmol/L (ref 98–110)
Creat: 1.05 mg/dL — ABNORMAL HIGH (ref 0.50–0.99)
Globulin: 3.4 g/dL (ref 1.9–3.7)
Glucose, Bld: 178 mg/dL — ABNORMAL HIGH (ref 65–99)
Potassium: 4 mmol/L (ref 3.5–5.3)
Sodium: 142 mmol/L (ref 135–146)
Total Bilirubin: 0.2 mg/dL (ref 0.2–1.2)
Total Protein: 7.3 g/dL (ref 6.1–8.1)
eGFR: 65 mL/min/1.73m2 (ref >=60)

## 2024-06-12 LAB — HIV-1 RNA QUANT-NO REFLEX-BLD
HIV 1 RNA Quant: <20 {copies}/mL — AB
HIV-1 RNA Quant, Log: <1.3 {Log_copies}/mL — AB

## 2024-06-12 LAB — HEMOGLOBIN A1C
Hgb A1c MFr Bld: 7.2 % — ABNORMAL HIGH (ref <5.7)
Mean Plasma Glucose: 160 mg/dL
eAG (mmol/L): 8.9 mmol/L

## 2024-06-16 ENCOUNTER — Ambulatory Visit: Payer: Self-pay | Admitting: Family

## 2024-06-16 DIAGNOSIS — E1165 Type 2 diabetes mellitus with hyperglycemia: Secondary | ICD-10-CM

## 2024-06-16 MED ORDER — METFORMIN HCL ER 750 MG PO TB24: 750.0000 mg | ORAL_TABLET | Freq: Every day | ORAL | 5 refills | Status: AC

## 2024-06-23 ENCOUNTER — Encounter: Payer: Self-pay | Admitting: Podiatry

## 2024-06-23 ENCOUNTER — Ambulatory Visit: Admitting: Podiatry

## 2024-06-23 DIAGNOSIS — M2042 Other hammer toe(s) (acquired), left foot: Secondary | ICD-10-CM | POA: Diagnosis not present

## 2024-06-23 DIAGNOSIS — M2041 Other hammer toe(s) (acquired), right foot: Secondary | ICD-10-CM

## 2024-06-25 NOTE — Progress Notes (Signed)
 Subjective:   Patient ID: Tiffany Ray, female   DOB: 48 y.o.   MRN: [id number]   HPI Patient presents with significant digital deformities bilateral with pain and chronic lesion formation that occurs with the stress that she is experiencing.  She does smoke 1/4 pack/day and tries to be active   Review of Systems  All other systems reviewed and are negative.       Objective:  Physical Exam Vitals and nursing note reviewed.  Constitutional:      Appearance: She is well-developed.  Pulmonary:     Effort: Pulmonary effort is normal.  Musculoskeletal:        General: Normal range of motion.  Skin:    General: Skin is warm.  Neurological:     Mental Status: She is alert.     Neurovascular status intact muscle strength found to be adequate range of motion adequate with patient noted to have discomfort with keratotic tissue formation around the second metatarsal both feet with elevation of the digits which seems to be creating a lot of stress on this area.  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  Patient will be seen back to recheck signed visit

## 2024-07-03 ENCOUNTER — Ambulatory Visit: Admitting: Infectious Diseases

## 2024-07-08 ENCOUNTER — Ambulatory Visit: Admitting: Family Medicine

## 2024-07-21 DIAGNOSIS — G8929 Other chronic pain: Secondary | ICD-10-CM

## 2024-07-21 NOTE — Telephone Encounter (Addendum)
 Patient came into the office today stating that her job is requiring more information in regards to her work restrictions and next steps. They sent her home today because there has not been any updated information given.   Can you please contact the patient to discuss further?  ** She can not return to work until they receive more information from us .**  Patient is currently scheduled for 1/15.

## 2024-07-22 NOTE — Addendum Note (Signed)
 Addended by: GEROME ILEANA RAMAN on: 07/22/2024 03:48 PM   Modules accepted: Orders

## 2024-07-22 NOTE — Telephone Encounter (Signed)
 New work note sent to pt through [employer]. Referral placed to [name] to work on conservative management. Pt scheduled for a f/u visit Thursday to further discuss her knee, tx, and possible STD.

## 2024-07-22 NOTE — Telephone Encounter (Signed)
 MRI R Knee has been order but not completed.   Per last letter dated 05/06/24:  To Whom It May Concern,   I am writing on behalf of my patient, Tiffany Ray. Kehres, who is currently under evaluation for a lower extremity musculoskeletal condition. Based on clinical assessment and ongoing symptoms, I am recommending the following temporary workplace accommodations to support her recovery and prevent further aggravation of her condition:   Work Restrictions:   Weight/Lifting Limitation: Ms. Kasperski should avoid lifting more than 10 pounds. This restriction is recommended for a duration of 4 weeks, pending further diagnostic evaluation. Standing Limitation: She should avoid prolonged standing exceeding 30 minutes at a time without a seated break. Opportunities to sit and elevate the affected limb are advised throughout the workday. Footwear Accommodation: Ms. Toothaker should be permitted to wear comfortable, supportive footwear, including Crocs, to reduce strain on the lower extremities and support joint stability.   An MRI of the lower extremity has been ordered to further assess the underlying pathology and guide treatment planning. These accommodations may be revised based on imaging results and clinical progress.   Thank you for your attention to this matter. Please contact my office if further documentation or clarification is needed.   Sincerely,    Artist Lloyd, MD

## 2024-07-23 ENCOUNTER — Ambulatory Visit: Admitting: Family Medicine

## 2024-07-24 ENCOUNTER — Ambulatory Visit: Admitting: Family Medicine

## 2024-07-24 ENCOUNTER — Other Ambulatory Visit: Payer: Self-pay

## 2024-07-24 ENCOUNTER — Encounter: Payer: Self-pay | Admitting: Family Medicine

## 2024-07-24 VITALS — BP 122/88 | Ht 70.0 in | Wt 274.0 lb

## 2024-07-24 DIAGNOSIS — M25561 Pain in right knee: Secondary | ICD-10-CM | POA: Diagnosis not present

## 2024-07-24 DIAGNOSIS — G8929 Other chronic pain: Secondary | ICD-10-CM

## 2024-07-24 NOTE — Progress Notes (Signed)
"       ° °  I, Leotis Batter, CMA acting as a scribe for Artist Lloyd, MD.  Tiffany Ray is a 49 y.o. female who presents to [name] today for exacerbation of her R knee pain. Pt was last seen by Dr. Lloyd on 04/30/24 and her R knee was aspirated and injected. MRI also ordered. Work note provided.  MRI was denied. Pt was later referred to PT.  Today, pt reports exacerbation of right knee sx over the past month. Notes that work has not been allowing her to do light duty, thinks that this contributed to exacerbation. Swelling present around the knee and into the leg. Occasional mechanical sx. Not currently taking any meds for pain and inflammation.   Dx testing: 08/18/2022 R knee XR and culture   Pertinent review of systems: No fevers or chills  Relevant historical information: Diabetes   Exam:  BP 122/88   Ht 5' 10 (1.778 m)   Wt 274 lb (124.3 kg)   LMP 06/05/2020   BMI 39.31 kg/m  General: Well Developed, well nourished, and in no acute distress.   MSK: Right knee mild effusion normal motion mild antalgic gait.    Lab and Radiology Results  Procedure: Real-time Ultrasound Guided Injection of right knee joint superior lateral patella space Device: Philips Affiniti 50G/GE Logiq Images permanently stored and available for review in PACS Verbal informed consent obtained.  Discussed risks and benefits of procedure. Warned about infection, bleeding, hyperglycemia damage to structures among others. Patient expresses understanding and agreement Time-out conducted.   Noted no overlying erythema, induration, or other signs of local infection.   Skin prepped in a sterile fashion.   Local anesthesia: Topical Ethyl chloride.   With sterile technique and under real time ultrasound guidance: 40 mg of Kenalog and 2 mL of Marcaine  injected into knee joint. Fluid seen entering the joint capsule.   Completed without difficulty   Pain immediately resolved  suggesting accurate placement of the medication.   Advised to call if fevers/chills, erythema, induration, drainage, or persistent bleeding.   Images permanently stored and available for review in the ultrasound unit.  Impression: Technically successful ultrasound guided injection.        Assessment and Plan: 49 y.o. female with right knee pain due to exacerbation of DJD.  There may be more sources of pain that are visible on x-ray.  Plan for injection today and physical therapy.  PT has already been ordered and is starting on January 12.  We also spent time talking about her work restrictions.  Plan to continue light duty.  Consider MRI if not improved.  Recheck after finishing PT.   PDMP not reviewed this encounter. Orders Placed This Encounter  Procedures   US  LIMITED JOINT SPACE STRUCTURES LOW RIGHT(NO LINKED CHARGES)    Reason for Exam (SYMPTOM  OR DIAGNOSIS REQUIRED):   right knee pain and swelling    Preferred imaging location?:   [facility]   No orders of the defined types were placed in this encounter.    Discussed warning signs or symptoms. Please see discharge instructions. Patient expresses understanding.   The above documentation has been reviewed and is accurate and complete Artist Lloyd, M.D.   "

## 2024-07-24 NOTE — Patient Instructions (Addendum)
 Thank you for coming in today.   You received an injection today. Seek immediate medical attention if the joint becomes red, extremely painful, or is oozing fluid.   Work note provided today, return to work 08/04/2024 with accommodations that should remain in place for 8 weeks.   See you back after completing Physical Therapy.

## 2024-07-28 ENCOUNTER — Other Ambulatory Visit: Payer: Self-pay

## 2024-07-28 ENCOUNTER — Ambulatory Visit: Admitting: Physical Therapy

## 2024-07-28 ENCOUNTER — Other Ambulatory Visit (HOSPITAL_COMMUNITY): Payer: Self-pay

## 2024-07-28 NOTE — Progress Notes (Unsigned)
 " OUTPATIENT PHYSICAL THERAPY LOWER EXTREMITY EVALUATION  Patient Name: Tiffany Ray MRN: [id number] DOB:Jan 12, 1976, 49 y.o., female Today's Date: 07/28/2024    Past Medical History:  Diagnosis Date   Abnormal uterine bleeding (AUB)    Adnexal mass    Anemia    HIV infection (HCC)    Pneumonia    Pre-diabetes    Prediabetes    Type 2 diabetes mellitus (HCC) 10/06/2019   Past Surgical History:  Procedure Laterality Date   ECTOPIC PREGNANCY SURGERY     ROBOTIC ASSISTED SALPINGO OOPHERECTOMY Bilateral 07/08/2020   Procedure: XI ROBOTIC ASSISTED LEFT SALPINGO OOPHORECTOMY, RIGHT SALPINGECTOMY;  Surgeon: Viktoria Comer SAUNDERS, MD;  Location: [facility];  Service: Gynecology;  Laterality: Bilateral;   TUBAL LIGATION     Patient Active Problem List   Diagnosis Date Noted   At increased risk for cardiovascular disease 06/09/2024   Wart of hand 06/09/2024   Chronic pain of right knee 01/27/2022   BMI 38.0-38.9,adult 10/03/2021   Numbness and tingling 09/15/2020   Post-op pain 07/30/2020   Other constipation 07/30/2020   Endometriosis    Pelvic mass in female 02/23/2020   Pelvic pain in female 02/23/2020   Menorrhagia with irregular cycle 11/04/2019   Screening for cervical cancer 11/04/2019   Type 2 diabetes mellitus (HCC) 10/06/2019   HIV disease (HCC) 09/13/2018   Healthcare maintenance 09/13/2018    PCP: Philemon Cordella BIRCH, FNP  REFERRING PROVIDER: Joane Artist RAMAN, MD  THERAPY DIAG:  No diagnosis found.  REFERRING DIAG: Chronic pain of right knee [M25.561, G89.29]   Rationale for Evaluation and Treatment:  Rehabilitation  SUBJECTIVE:  PERTINENT PAST HISTORY:  HIV, DM TII        PRECAUTIONS: {Therapy precautions:24002}  WEIGHT BEARING RESTRICTIONS {Yes ***/No:24003}  FALLS:  Has patient fallen in last 6 months? {yes/no:20286}, Number of falls: ***  MOI/History of condition:  Onset date: ***  SUBJECTIVE STATEMENT  Pt is a 49 y.o. female  who presents to clinic with chief complaint of chronic R knee pain.  .  ***   Red flags:  {has/denies:26543} {kerredflag:26542}  Pain:  Are you having pain? {yes/no:20286} Pain location: *** NPRS scale:  Best: {NUMBERS; 0-10:5044}/10, Worst: {NUMBERS; 0-10:5044}/10 Aggravating factors: *** Relieving factors: *** Pain description: {PAIN DESCRIPTION:21022940}  Occupation: ***  Assistive Device: ***  Hand Dominance: ***  Patient Goals/Specific Activities: ***   OBJECTIVE:   DIAGNOSTIC FINDINGS:  ***  GENERAL OBSERVATION/GAIT: ***  SENSATION: Light touch: {intact/deficits:24005}  PALPATION: ***  MUSCLE LENGTH: Hamstrings: Right {kerminsig:27227} restriction; Left {kerminsig:27227} restriction Hip flexors: Right {kerminsig:27227} restriction; Left {kerminsig:27227} restriction Rec Fem: Right {kerminsig:27227} restriction; Left {kerminsig:27227} restriction  LE MMT:  MMT Right (Eval) Left (Eval)  Hip flexion (L2, L3) *** ***  Knee extension (L3) *** ***  Knee flexion *** ***  Hip abduction *** ***  Hip extension *** ***  Hip external rotation *** ***  Hip internal rotation *** ***  Hip adduction    Ankle dorsiflexion (L4)    Ankle plantarflexion (S1)    Ankle inversion    Ankle eversion    Great Toe ext (L5)    Grossly     (Blank rows = not tested, score listed is out of 5 possible points OR may be listed in lbs of force.  N = WNL, D = diminished, C = clear for gross weakness with myotome testing, * = concordant pain with testing)  LE ROM:  ROM Right (Eval) Left (Eval)  Hip flexion  Hip extension    Hip abduction    Hip adduction    Hip internal rotation    Hip external rotation    Knee extension *** ***  Knee flexion *** ***  Ankle dorsiflexion *** ***  Ankle plantarflexion    Ankle inversion    Ankle eversion     (Blank rows = not tested, N = WNL, * = concordant pain with testing)  Functional Tests  Eval                                                               SPECIAL TESTS:  ***   PATIENT SURVEYS:  LEFS: ***  HOME EXERCISE PROGRAM: ***  Treatment priorities   Eval                                                 TODAY'S TREATMENT:  Therapeutic Exercise: Creating, reviewing, and completing HEP   PATIENT EDUCATION (/HM):  POC, diagnosis, prognosis, HEP, and outcome measures.  Pt educated via explanation, demonstration, and handout (HEP).  Pt confirms understanding verbally.   ASSESSMENT:  CLINICAL IMPRESSION: Para is a 49 y.o. female who presents to clinic with signs and sxs consistent with ***.   ***.   Malissie will benefit from skilled PT to address relevant deficits and improve ***.   OBJECTIVE IMPAIRMENTS: Pain, ***  ACTIVITY LIMITATIONS: ***  PERSONAL FACTORS: See medical history and pertinent history   REHAB POTENTIAL: Good  CLINICAL DECISION MAKING: Evolving/moderate complexity  EVALUATION COMPLEXITY: Moderate   GOALS:   SHORT TERM GOALS: Target date: ***  Haneen will be >75% HEP compliant to improve carryover between sessions and facilitate independent management of condition  Evaluation: ongoing Goal status: INITIAL   LONG TERM GOALS: Target date: ***  Ranya will self report >/= 50% decrease in pain from evaluation to improve function in daily tasks  Evaluation/Baseline: ***/10 max pain Goal status: INITIAL   2.  Nesreen will show a >/= *** pt improvement in LEFS score (MCID is ~11% or 9 pts) as a proxy for functional improvement   Evaluation/Baseline: *** pts Goal status: INITIAL   3.  Tira will be able to ***, not limited by pain  Evaluation/Baseline: limited Goal status: INITIAL   4.  ***   5.  ***   6.  ***   PLAN: PT FREQUENCY: 1-2x/week  PT DURATION: 8 weeks  PLANNED INTERVENTIONS:  97164- PT Re-evaluation, 97110-Therapeutic exercises, 97530- Therapeutic activity, [id number]- Neuromuscular re-education, 97535- Self  Care, 02859- Manual therapy, [id number]- Gait training, [id number]- Aquatic Therapy, [phone]- Electrical stimulation (manual), [id number]- Vasopneumatic device, [id number]- Traction (mechanical), [id number]- Ionotophoresis 4mg /ml Dexamethasone , Taping, Dry Needling, Joint manipulation, and Spinal manipulation.   Naylee Frankowski E Winola Drum PT 07/28/2024, 12:42 PM "

## 2024-07-28 NOTE — Progress Notes (Signed)
 Specialty Pharmacy Refill Coordination Note  Tiffany Ray is a 49 y.o. female assessed today regarding refills of clinic administered specialty medication(s) Cabotegravir  & Rilpivirine  (CABENUVA )   Clinic requested Courier to Provider Office   Delivery date: 07/31/24   Verified address: [address] [place] [address]   Medication will be filled on 07/30/24.

## 2024-07-29 ENCOUNTER — Other Ambulatory Visit (HOSPITAL_COMMUNITY): Payer: Self-pay

## 2024-07-29 ENCOUNTER — Telehealth: Payer: Self-pay

## 2024-07-29 NOTE — Telephone Encounter (Addendum)
 Submitted a Prior Authorization request to [id number] for Cabenuva  Medical Benefits via [employer] Portal. Will update once we receive a response.  J Rniz:[id number] CPT: 03627  PA ID: [id number]   08/07/24 I faxed chart notes and Labs to [phone]  Case ID  [id number]

## 2024-07-31 ENCOUNTER — Ambulatory Visit: Admitting: Family Medicine

## 2024-07-31 ENCOUNTER — Other Ambulatory Visit: Payer: Self-pay

## 2024-07-31 ENCOUNTER — Encounter: Payer: Self-pay | Admitting: Physical Therapy

## 2024-07-31 ENCOUNTER — Ambulatory Visit: Attending: Family Medicine | Admitting: Physical Therapy

## 2024-07-31 DIAGNOSIS — M6281 Muscle weakness (generalized): Secondary | ICD-10-CM | POA: Diagnosis present

## 2024-07-31 DIAGNOSIS — M25561 Pain in right knee: Secondary | ICD-10-CM | POA: Insufficient documentation

## 2024-07-31 DIAGNOSIS — M5459 Other low back pain: Secondary | ICD-10-CM | POA: Diagnosis present

## 2024-07-31 DIAGNOSIS — R2689 Other abnormalities of gait and mobility: Secondary | ICD-10-CM | POA: Insufficient documentation

## 2024-07-31 DIAGNOSIS — G8929 Other chronic pain: Secondary | ICD-10-CM | POA: Insufficient documentation

## 2024-07-31 NOTE — Therapy (Signed)
 " OUTPATIENT PHYSICAL THERAPY LOWER EXTREMITY EVALUATION   Patient Name: Tiffany Ray MRN: [id number] DOB:July 10, 1976, 49 y.o., female Today's Date: 07/31/2024  END OF SESSION:  PT End of Session - 07/31/24 1309     Visit Number 1    Number of Visits 12    Date for Recertification  09/25/24    Authorization Type [employer]    Progress Note Due on Visit 10    PT Start Time 1215    PT Stop Time 1306    PT Time Calculation (min) 51 min    Activity Tolerance Patient tolerated treatment well    Behavior During Therapy [facility] for tasks assessed/performed          Past Medical History:  Diagnosis Date   Abnormal uterine bleeding (AUB)    Adnexal mass    Anemia    HIV infection (HCC)    Pneumonia    Pre-diabetes    Prediabetes    Type 2 diabetes mellitus (HCC) 10/06/2019   Past Surgical History:  Procedure Laterality Date   ECTOPIC PREGNANCY SURGERY     ROBOTIC ASSISTED SALPINGO OOPHERECTOMY Bilateral 07/08/2020   Procedure: XI ROBOTIC ASSISTED LEFT SALPINGO OOPHORECTOMY, RIGHT SALPINGECTOMY;  Surgeon: Viktoria Comer SAUNDERS, MD;  Location: [facility];  Service: Gynecology;  Laterality: Bilateral;   TUBAL LIGATION     Patient Active Problem List   Diagnosis Date Noted   At increased risk for cardiovascular disease 06/09/2024   Wart of hand 06/09/2024   Chronic pain of right knee 01/27/2022   BMI 38.0-38.9,adult 10/03/2021   Numbness and tingling 09/15/2020   Post-op pain 07/30/2020   Other constipation 07/30/2020   Endometriosis    Pelvic mass in female 02/23/2020   Pelvic pain in female 02/23/2020   Menorrhagia with irregular cycle 11/04/2019   Screening for cervical cancer 11/04/2019   Type 2 diabetes mellitus (HCC) 10/06/2019   HIV disease (HCC) 09/13/2018   Healthcare maintenance 09/13/2018    PCP: No PCP  REFERRING PROVIDER: Joane Artist RAMAN, MD   REFERRING DIAG: [phone] (ICD-10-CM) - Chronic pain of right knee   Evaluate and Treat  for knee pain 1-2 times per week for 4-6 weeks.   Decrease pain, increase strength, flexibility, function, and range of motion.  Modalities may include, traction, ionto, phono, stim, and dry needling prn.  THERAPY DIAG:  Chronic pain of right knee  Other low back pain  Muscle weakness (generalized)  Other abnormalities of gait and mobility  Rationale for Evaluation and Treatment: Rehabilitation  ONSET DATE: 2-3 years ago (chronic), worsening   SUBJECTIVE:   SUBJECTIVE STATEMENT: Pt states that she noted onset of R knee pain 2-3 years ago without MOI. 1-2 years ago started seeing sports medicine, was scheduled for MRI, declined by insurance. Symptoms are overall worsening in the last 6 months, notes instability in R knee, inc pain, difficulty walking. Pt works at [employer] and has inc time on her feet. Not currently working, will plan to return 08/04/24 with light duty x8 weeks. Symptoms range in intensity from 6/10-10/10 depending on activity or position, independent of time of day. Since the injection pt reports 0/10 pain. Denies paraesthesia. Pt endorses low back pain and edema in RLE, lower leg portion. Pt reports that she has some incontinence since September or October when symptoms of R knee pain exacerbated.   PERTINENT HISTORY: From referring provider visit 07/24/24:  49 y.o. female with right knee pain due to exacerbation of DJD.  There may  be more sources of pain that are visible on x-ray.  Plan for injection today and physical therapy.  PT has already been ordered and is starting on January 12.  We also spent time talking about her work restrictions.  Plan to continue light duty.  Consider MRI if not improved.  Recheck after finishing PT.    PAIN:  Are you having pain? Yes: NPRS scale: 0/10 Pain location: R knee Pain description: ache, unstable Aggravating factors: walking, prolonged activity Relieving factors: rest, unloading RLE, medication  PRECAUTIONS: None  RED  FLAGS: Incontinent of bladder intermittently, able to identify need to go, unable to hold it if she cannot immediately get to the bathroom, denies saddle anesthesia, ataxic patterns   WEIGHT BEARING RESTRICTIONS: No  FALLS:  Has patient fallen in last 6 months? No  LIVING ENVIRONMENT: Lives with: lives with their son and lives with their daughter Lives in: House/apartment Stairs: Yes: Internal: flight steps; on right going up Has following equipment at home: None  OCCUPATION: works at [employer], Administrator, Sports duties  PLOF: Independent  PATIENT GOALS: decrease, pain, improve tolerance to standing and walking  NEXT MD VISIT: Will plan to schedule with sports med following therapy, or as needed  OBJECTIVE:  Note: Objective measures were completed at Evaluation unless otherwise noted.  DIAGNOSTIC FINDINGS: none noted for referring dx  PATIENT SURVEYS:  LEFS  Extreme difficulty/unable (0), Quite a bit of difficulty (1), Moderate difficulty (2), Little difficulty (3), No difficulty (4) Survey date:  07/31/24  Any of your usual work, housework or school activities 0  2. Usual hobbies, recreational or sporting activities 0  3. Getting into/out of the bath 3  4. Walking between rooms 3  5. Putting on socks/shoes 3  6. Squatting  3  7. Lifting an object, like a bag of groceries from the floor 0  8. Performing light activities around your home 3  9. Performing heavy activities around your home 0  10. Getting into/out of a car 0  11. Walking 2 blocks 0  12. Walking 1 mile 0  13. Going up/down 10 stairs (1 flight) 0  14. Standing for 1 hour 0  15.  sitting for 1 hour 0  16. Running on even ground 0  17. Running on uneven ground 0  18. Making sharp turns while running fast 0  19. Hopping  0  20. Rolling over in bed 0  Score total:  15/80     COGNITION: Overall cognitive status: Within functional limits for tasks assessed     SENSATION: Hypersensitivity noted in RLE at L3  and S1 dermatome rated as 100% on RLE (more sensation), 50% on LLE at identical points.  EDEMA:  Joint line -5cm: 37cm Joint line:   42cm Joint line +5cm: 45cm   POSTURE: rounded shoulders and forward head  PALPATION:  Inc TTP at the joint line, no pitting with edema of R knee  LUMBAR ROM: Flexion: 50% loss, pulling in anterior right leg Extension: 50% loss, pulling in anterior right leg Side glide right: nil loss Side glide left: nil loss  LOWER EXTREMITY ROM:  Active ROM Right eval Left eval  Hip flexion    Hip extension    Hip abduction    Hip adduction    Hip internal rotation    Hip external rotation    Knee flexion 90   Knee extension 0   Ankle dorsiflexion    Ankle plantarflexion    Ankle inversion  Ankle eversion     (Blank rows = not tested)  LOWER EXTREMITY MMT:  MMT Right eval Left eval  Hip flexion    Hip extension    Hip abduction    Hip adduction    Hip internal rotation    Hip external rotation    Knee flexion 3/5 4+/5  Knee extension 4-/5 4+/5  Ankle dorsiflexion    Ankle plantarflexion    Ankle inversion    Ankle eversion     (Blank rows = not tested)    GAIT: Distance walked: lobby to treatment area  Assistive device utilized: None Level of assistance: Complete Independence Comments: wide BOS, dec velocity, minor weight shift towards RLE in stance phase, symmetric stride length                                                                                                                                TREATMENT DATE:   07/31/24: EVAL    PATIENT EDUCATION:  Education details: Pt educated on relevant anatomy, physiology, pathology, diagnosis, prognosis, progression of care, pain and activity modification related to right knee pain Person educated: Patient Education method: Explanation, Demonstration, and Handouts Education comprehension: verbalized understanding and returned demonstration  HOME EXERCISE PROGRAM: Access  Code: [email or web address] URL: [email or web address] Date: 07/31/2024 Prepared by: Stann Ohara  Exercises - Seated Quad Set  - 5-6 x daily - 7 x weekly - 1 sets - 10 reps - 2 hold - Side to Side Weight Shift with Counter Support  - 1 x daily - 7 x weekly - 1 sets - 30 reps - 2 hold  ASSESSMENT:  CLINICAL IMPRESSION: Patient is a 48 y.o. F who was seen today for physical therapy evaluation and treatment for R knee pain. Symptoms are consistent with mechanical impingement of the right knee. Potential for lumbar referral to the RLE based on ROM testing of the lumbar spine and RLE reproduced. Pt has been doing well since injection completed at MD visit 07/24/24. Will plan to initiate pt with 2 times per week fading to 1 time per week and inc pt independence when appropriate. Pt stands to benefit from continued skilled physical therapy to address deficit areas and restore safety with activities and participations at home and in the community.     OBJECTIVE IMPAIRMENTS: decreased activity tolerance, decreased endurance, difficulty walking, decreased ROM, decreased strength, increased edema, increased fascial restrictions, impaired perceived functional ability, and pain.   ACTIVITY LIMITATIONS: carrying, lifting, bending, standing, squatting, and stairs  PARTICIPATION LIMITATIONS: cleaning, laundry, driving, shopping, community activity, occupation, and yard work  PERSONAL FACTORS: Fitness, Past/current experiences, Profession, Time since onset of injury/illness/exacerbation, and 1 comorbidity: DM are also affecting patient's functional outcome.   REHAB POTENTIAL: Good  CLINICAL DECISION MAKING: Stable/uncomplicated  EVALUATION COMPLEXITY: Low   GOALS: Goals reviewed with patient? Yes  SHORT TERM GOALS: Target date: 08/28/24   Pt will report compliance with HEP to work towards ind and  home management strategies Baseline: Goal status: INITIAL   2.  Pt will score no less than 30/80 on  LEFS to demonstrate improved activity tolerance Baseline:  Goal status: INITIAL   3.  Pt will improve R knee ROM to full and painless in order to demonstrate progress towards activity tolerance and improved function Baseline:  Goal status: INITIAL     LONG TERM GOALS: Target date: 09/25/24   Pt will score no less than 60/80 on LEFS to demonstrate improved activity tolerance Baseline:  Goal status: INITIAL   2.  Pt will report no greater than 2/10 pain over 7 consecutive days to demonstrate maintained reduction in symptoms and improved tolerance to activity Baseline:  Goal status: INITIAL   3.  Pt will be ind in the management of their symptoms at home and in the community Baseline:  Goal status: INITIAL      PLAN:  PT FREQUENCY: 1-2x/week  PT DURATION: 8 weeks  PLANNED INTERVENTIONS: 97110-Therapeutic exercises, 97530- Therapeutic activity, [id number]- Neuromuscular re-education, 97535- Self Care, 02859- Manual therapy, [id number]- Gait training, [phone]- Electrical stimulation (unattended), 97016- Vasopneumatic device, [id number]- Ionotophoresis 4mg /ml Dexamethasone , 79439 (1-2 muscles), 20561 (3+ muscles)- Dry Needling, Patient/Family education, Cryotherapy, and Moist heat  PLAN FOR NEXT SESSION: Modify HEP as needed, progress lower quarter strength, stability, endurance, mobility, motor control. Assess role of lumbar spine in functional deficits. Provider discretion.    Stann DELENA Ohara, PT 07/31/2024, 1:16 PM  "

## 2024-08-01 ENCOUNTER — Other Ambulatory Visit (HOSPITAL_COMMUNITY): Payer: Self-pay

## 2024-08-05 NOTE — Progress Notes (Unsigned)
" ° °  HPI: Tiffany Ray is a 49 y.o. female who presents to the [facility] pharmacy clinic for Cabenuva  administration.  Referring ID Provider: Cathlyn July, NP  Patient Active Problem List   Diagnosis Date Noted   At increased risk for cardiovascular disease 06/09/2024   Wart of hand 06/09/2024   Chronic pain of right knee 01/27/2022   BMI 38.0-38.9,adult 10/03/2021   Numbness and tingling 09/15/2020   Post-op pain 07/30/2020   Other constipation 07/30/2020   Endometriosis    Pelvic mass in female 02/23/2020   Pelvic pain in female 02/23/2020   Menorrhagia with irregular cycle 11/04/2019   Screening for cervical cancer 11/04/2019   Type 2 diabetes mellitus (HCC) 10/06/2019   HIV disease (HCC) 09/13/2018   Healthcare maintenance 09/13/2018    Patient's Medications  New Prescriptions   No medications on file  Previous Medications   CABOTEGRAVIR  & RILPIVIRINE  ER (CABENUVA ) 600 & 900 MG/3ML INJECTION    Inject 1 kit into the muscle every 2 (two) months.   METFORMIN  (GLUCOPHAGE -XR) 750 MG 24 HR TABLET    Take 1 tablet (750 mg total) by mouth daily with breakfast.   ROSUVASTATIN  (CRESTOR ) 10 MG TABLET    Take 1 tablet (10 mg total) by mouth daily.  Modified Medications   No medications on file  Discontinued Medications   No medications on file    Allergies: Allergies[1]  Labs: Lab Results  Component Value Date   HIV1RNAQUANT <20 DETECTED (A) 06/09/2024   HIV1RNAQUANT NOT DETECTED 02/05/2024   HIV1RNAQUANT <20 DETECTED (A) 10/04/2023   CD4TABS 900 06/09/2024   CD4TABS 784 10/04/2023   CD4TABS 758 07/13/2022    RPR and STI Lab Results  Component Value Date   LABRPR NON-REACTIVE 09/27/2022   LABRPR NON-REACTIVE 09/15/2020   LABRPR NON-REACTIVE 05/13/2019   LABRPR NON-REACTIVE 08/27/2018    STI Results GC CT  09/27/2022 11:43 AM Negative    Negative    Negative  Negative    Negative    Negative   09/23/2019 12:27 PM Negative  Negative     Hepatitis B Lab Results   Component Value Date   HEPBSAB REACTIVE (A) 06/15/2022   HEPBSAG NON-REACTIVE 08/27/2018   HEPBCAB NON-REACTIVE 08/27/2018   Hepatitis C Lab Results  Component Value Date   HEPCAB NON-REACTIVE 08/27/2018   Hepatitis A Lab Results  Component Value Date   HAV REACTIVE (A) 09/27/2022   Lipids: Lab Results  Component Value Date   CHOL 258 (H) 02/05/2024   TRIG 249 (H) 02/05/2024   HDL 49 (L) 02/05/2024   CHOLHDL 5.3 (H) 02/05/2024   LDLCALC 168 (H) 02/05/2024    Target Date: 20th  Assessment: Tiffany Ray presents today for her maintenance Cabenuva  injections. Past injections were tolerated well without issues. Last HIV RNA was undetectable in November. Doing well with no issues today.  Lab work:  No labs today  Eligible vaccinations:  COVID  Cabenuva : Administered cabotegravir  600mg /64mL in left upper outer quadrant of the gluteal muscle. Administered rilpivirine  900 mg/3mL in the right upper outer quadrant of the gluteal muscle. No issues with injections. *** will follow up in 2 months for next set of injections.  Plan: - Cabenuva  injections administered - Next injections scheduled for *** - Call with any issues or questions  Maurilio Patten, PharmD PGY1 Pharmacy Resident [facility] 08/05/2024 8:43 PM    [1] No Known Allergies  "

## 2024-08-06 ENCOUNTER — Telehealth: Payer: Self-pay | Admitting: Family Medicine

## 2024-08-06 ENCOUNTER — Encounter: Admitting: Pharmacist

## 2024-08-06 ENCOUNTER — Encounter: Payer: Self-pay | Admitting: Pharmacist

## 2024-08-06 NOTE — Telephone Encounter (Addendum)
 Patient called and she is on medical leave and she has a form to fill out. She is asking if she can email that to us . She needs to know how to go about getting that to us  to fill out or if she needs an appt. Please advise.

## 2024-08-07 NOTE — Telephone Encounter (Signed)
 Last OV 07/24/24 - no need for another OV for form completion.

## 2024-08-12 ENCOUNTER — Ambulatory Visit: Admitting: Physical Therapy

## 2024-08-12 ENCOUNTER — Telehealth: Payer: Self-pay

## 2024-08-12 ENCOUNTER — Encounter: Payer: Self-pay | Admitting: Physical Therapy

## 2024-08-12 DIAGNOSIS — M5459 Other low back pain: Secondary | ICD-10-CM

## 2024-08-12 DIAGNOSIS — M25561 Pain in right knee: Secondary | ICD-10-CM | POA: Diagnosis not present

## 2024-08-12 DIAGNOSIS — G8929 Other chronic pain: Secondary | ICD-10-CM

## 2024-08-12 DIAGNOSIS — R2689 Other abnormalities of gait and mobility: Secondary | ICD-10-CM

## 2024-08-12 DIAGNOSIS — M6281 Muscle weakness (generalized): Secondary | ICD-10-CM

## 2024-08-12 NOTE — Progress Notes (Unsigned)
 "  HPI: Tiffany Ray is a 49 y.o. female who presents to the [facility] pharmacy clinic for Cabenuva  administration.  Referring ID Physician: Cathlyn July, NP   Patient Active Problem List   Diagnosis Date Noted   At increased risk for cardiovascular disease 06/09/2024   Wart of hand 06/09/2024   Chronic pain of right knee 01/27/2022   BMI 38.0-38.9,adult 10/03/2021   Numbness and tingling 09/15/2020   Post-op pain 07/30/2020   Other constipation 07/30/2020   Endometriosis    Pelvic mass in female 02/23/2020   Pelvic pain in female 02/23/2020   Menorrhagia with irregular cycle 11/04/2019   Screening for cervical cancer 11/04/2019   Type 2 diabetes mellitus (HCC) 10/06/2019   HIV disease (HCC) 09/13/2018   Healthcare maintenance 09/13/2018    Patient's Medications  New Prescriptions   No medications on file  Previous Medications   CABOTEGRAVIR  & RILPIVIRINE  ER (CABENUVA ) 600 & 900 MG/3ML INJECTION    Inject 1 kit into the muscle every 2 (two) months.   METFORMIN  (GLUCOPHAGE -XR) 750 MG 24 HR TABLET    Take 1 tablet (750 mg total) by mouth daily with breakfast.   ROSUVASTATIN  (CRESTOR ) 10 MG TABLET    Take 1 tablet (10 mg total) by mouth daily.  Modified Medications   No medications on file  Discontinued Medications   No medications on file    Allergies: Allergies[1]  Past Medical History: Past Medical History:  Diagnosis Date   Abnormal uterine bleeding (AUB)    Adnexal mass    Anemia    HIV infection (HCC)    Pneumonia    Pre-diabetes    Prediabetes    Type 2 diabetes mellitus (HCC) 10/06/2019    Social History: Social History   Socioeconomic History   Marital status: Single    Spouse name: Not on file   Number of children: 4   Years of education: 12   Highest education level: Not on file  Occupational History   Not on file  Tobacco Use   Smoking status: Every Day    Current packs/day: 0.25    Types: Cigarettes   Smokeless tobacco: Never   Tobacco  comments:    States back and forth with quitting  Vaping Use   Vaping status: Never Used  Substance and Sexual Activity   Alcohol use: Never   Drug use: Never   Sexual activity: Not on file    Comment: declined condoms  Other Topics Concern   Not on file  Social History Narrative   Not on file   Social Drivers of Health   Tobacco Use: High Risk (07/31/2024)   Patient History    Smoking Tobacco Use: Every Day    Smokeless Tobacco Use: Never    Passive Exposure: Not on file  Financial Resource Strain: Not on file  Food Insecurity: Not on file  Transportation Needs: Not on file  Physical Activity: Not on file  Stress: Not on file  Social Connections: Not on file  Depression (PHQ2-9): Low Risk (10/04/2023)   Depression (PHQ2-9)    PHQ-2 Score: 0  Alcohol Screen: Not on file  Housing: Not on file  Utilities: Not on file  Health Literacy: Not on file    Labs: Lab Results  Component Value Date   HIV1RNAQUANT <20 DETECTED (A) 06/09/2024   HIV1RNAQUANT NOT DETECTED 02/05/2024   HIV1RNAQUANT <20 DETECTED (A) 10/04/2023   CD4TABS 900 06/09/2024   CD4TABS 784 10/04/2023   CD4TABS 758 07/13/2022  RPR and STI Lab Results  Component Value Date   LABRPR NON-REACTIVE 09/27/2022   LABRPR NON-REACTIVE 09/15/2020   LABRPR NON-REACTIVE 05/13/2019   LABRPR NON-REACTIVE 08/27/2018    STI Results GC CT  09/27/2022 11:43 AM Negative    Negative    Negative  Negative    Negative    Negative   09/23/2019 12:27 PM Negative  Negative     Hepatitis B Lab Results  Component Value Date   HEPBSAB REACTIVE (A) 06/15/2022   HEPBSAG NON-REACTIVE 08/27/2018   HEPBCAB NON-REACTIVE 08/27/2018   Hepatitis C Lab Results  Component Value Date   HEPCAB NON-REACTIVE 08/27/2018   Hepatitis A Lab Results  Component Value Date   HAV REACTIVE (A) 09/27/2022   Lipids: Lab Results  Component Value Date   CHOL 258 (H) 02/05/2024   TRIG 249 (H) 02/05/2024   HDL 49 (L) 02/05/2024    CHOLHDL 5.3 (H) 02/05/2024   LDLCALC 168 (H) 02/05/2024    TARGET DATE:  The 20th of the month  Assessment: [place] presents today for their maintenance Cabenuva  injections. Initial/past injections were tolerated well without issues. No problems with systemic effects of injections.   Administered cabotegravir  600mg /63mL in left upper outer quadrant of the gluteal muscle. Administered rilpivirine  900 mg/3mL in the right upper outer quadrant of the gluteal muscle. Monitored patient for 10 minutes after injection. Injections were tolerated well without issue. Patient will follow up in 2 months for next injection. Currently up to date on all recommended vaccines.   Plan: - Administer Cabenuva  injections  - Next injections scheduled for *** - Call with any issues or questions  Alan Geralds, PharmD, CPP, BCIDP, AAHIVP Clinical Pharmacist Practitioner Infectious Diseases Clinical Pharmacist [facility]      [1] No Known Allergies  "

## 2024-08-12 NOTE — Telephone Encounter (Signed)
 Spoke with patient, form received via E-mail.

## 2024-08-12 NOTE — Therapy (Signed)
 " OUTPATIENT PHYSICAL THERAPY LOWER EXTREMITY TREATMENT   Patient Name: Tiffany Ray MRN: [id number] DOB:05-12-1976, 49 y.o., female Today's Date: 08/12/2024  END OF SESSION:  PT End of Session - 08/12/24 1625     Visit Number 2    Number of Visits 12    Date for Recertification  09/25/24    Authorization Type [employer]    Authorization Time Period 08/12/24-09/25/24    Authorization - Visit Number 2    Authorization - Number of Visits 16    Progress Note Due on Visit 10    PT Start Time 1623    PT Stop Time 1701    PT Time Calculation (min) 38 min    Activity Tolerance Patient tolerated treatment well    Behavior During Therapy WFL for tasks assessed/performed           Past Medical History:  Diagnosis Date   Abnormal uterine bleeding (AUB)    Adnexal mass    Anemia    HIV infection (HCC)    Pneumonia    Pre-diabetes    Prediabetes    Type 2 diabetes mellitus (HCC) 10/06/2019   Past Surgical History:  Procedure Laterality Date   ECTOPIC PREGNANCY SURGERY     ROBOTIC ASSISTED SALPINGO OOPHERECTOMY Bilateral 07/08/2020   Procedure: XI ROBOTIC ASSISTED LEFT SALPINGO OOPHORECTOMY, RIGHT SALPINGECTOMY;  Surgeon: Viktoria Comer SAUNDERS, MD;  Location: [name];  Service: Gynecology;  Laterality: Bilateral;   TUBAL LIGATION     Patient Active Problem List   Diagnosis Date Noted   At increased risk for cardiovascular disease 06/09/2024   Wart of hand 06/09/2024   Chronic pain of right knee 01/27/2022   BMI 38.0-38.9,adult 10/03/2021   Numbness and tingling 09/15/2020   Post-op pain 07/30/2020   Other constipation 07/30/2020   Endometriosis    Pelvic mass in female 02/23/2020   Pelvic pain in female 02/23/2020   Menorrhagia with irregular cycle 11/04/2019   Screening for cervical cancer 11/04/2019   Type 2 diabetes mellitus (HCC) 10/06/2019   HIV disease (HCC) 09/13/2018   Healthcare maintenance 09/13/2018    PCP: No PCP  REFERRING  PROVIDER: Joane Artist RAMAN, MD   REFERRING DIAG: [phone] (ICD-10-CM) - Chronic pain of right knee   Evaluate and Treat for knee pain 1-2 times per week for 4-6 weeks.   Decrease pain, increase strength, flexibility, function, and range of motion.  Modalities may include, traction, ionto, phono, stim, and dry needling prn.  THERAPY DIAG:  Chronic pain of right knee  Other low back pain  Muscle weakness (generalized)  Other abnormalities of gait and mobility  Rationale for Evaluation and Treatment: Rehabilitation  ONSET DATE: 2-3 years ago (chronic), worsening   SUBJECTIVE:   SUBJECTIVE STATEMENT: Pt states that she has been placed on medical leave from work due to dec tolerance to complete duties and will be given clearance from MD to return. Reports compliance with HEP. Currently 0/10 pain.   EVAL:  Pt states that she noted onset of R knee pain 2-3 years ago without MOI. 1-2 years ago started seeing sports medicine, was scheduled for MRI, declined by insurance. Symptoms are overall worsening in the last 6 months, notes instability in R knee, inc pain, difficulty walking. Pt works at [employer] and has inc time on her feet. Not currently working, will plan to return 08/04/24 with light duty x8 weeks. Symptoms range in intensity from 6/10-10/10 depending on activity or position, independent of time of day.  Since the injection pt reports 0/10 pain. Denies paraesthesia. Pt endorses low back pain and edema in RLE, lower leg portion. Pt reports that she has some incontinence since September or October when symptoms of R knee pain exacerbated.   PERTINENT HISTORY: From referring provider visit 07/24/24:  50 y.o. female with right knee pain due to exacerbation of DJD.  There may be more sources of pain that are visible on x-ray.  Plan for injection today and physical therapy.  PT has already been ordered and is starting on January 12.  We also spent time talking about her work restrictions.  Plan  to continue light duty.  Consider MRI if not improved.  Recheck after finishing PT.    PAIN:  Are you having pain? Yes: NPRS scale: 0/10 Pain location: R knee Pain description: ache, unstable Aggravating factors: walking, prolonged activity Relieving factors: rest, unloading RLE, medication  PRECAUTIONS: None  RED FLAGS: Incontinent of bladder intermittently, able to identify need to go, unable to hold it if she cannot immediately get to the bathroom, denies saddle anesthesia, ataxic patterns   WEIGHT BEARING RESTRICTIONS: No  FALLS:  Has patient fallen in last 6 months? No  LIVING ENVIRONMENT: Lives with: lives with their son and lives with their daughter Lives in: House/apartment Stairs: Yes: Internal: flight steps; on right going up Has following equipment at home: None  OCCUPATION: works at [employer], Administrator, Sports duties  PLOF: Independent  PATIENT GOALS: decrease, pain, improve tolerance to standing and walking  NEXT MD VISIT: Will plan to schedule with sports med following therapy, or as needed  OBJECTIVE:  Note: Objective measures were completed at Evaluation unless otherwise noted.  DIAGNOSTIC FINDINGS: none noted for referring dx  PATIENT SURVEYS:  LEFS  Extreme difficulty/unable (0), Quite a bit of difficulty (1), Moderate difficulty (2), Little difficulty (3), No difficulty (4) Survey date:  07/31/24  Any of your usual work, housework or school activities 0  2. Usual hobbies, recreational or sporting activities 0  3. Getting into/out of the bath 3  4. Walking between rooms 3  5. Putting on socks/shoes 3  6. Squatting  3  7. Lifting an object, like a bag of groceries from the floor 0  8. Performing light activities around your home 3  9. Performing heavy activities around your home 0  10. Getting into/out of a car 0  11. Walking 2 blocks 0  12. Walking 1 mile 0  13. Going up/down 10 stairs (1 flight) 0  14. Standing for 1 hour 0  15.  sitting for  1 hour 0  16. Running on even ground 0  17. Running on uneven ground 0  18. Making sharp turns while running fast 0  19. Hopping  0  20. Rolling over in bed 0  Score total:  15/80     COGNITION: Overall cognitive status: Within functional limits for tasks assessed     SENSATION: Hypersensitivity noted in RLE at L3 and S1 dermatome rated as 100% on RLE (more sensation), 50% on LLE at identical points.  EDEMA:  Joint line -5cm: 37cm Joint line:   42cm Joint line +5cm: 45cm   POSTURE: rounded shoulders and forward head  PALPATION:  Inc TTP at the joint line, no pitting with edema of R knee  LUMBAR ROM: Flexion: 50% loss, pulling in anterior right leg Extension: 50% loss, pulling in anterior right leg Side glide right: nil loss Side glide left: nil loss  LOWER EXTREMITY ROM:  Active ROM Right eval Left eval  Hip flexion    Hip extension    Hip abduction    Hip adduction    Hip internal rotation    Hip external rotation    Knee flexion 90   Knee extension 0   Ankle dorsiflexion    Ankle plantarflexion    Ankle inversion    Ankle eversion     (Blank rows = not tested)  LOWER EXTREMITY MMT:  MMT Right eval Left eval  Hip flexion    Hip extension    Hip abduction    Hip adduction    Hip internal rotation    Hip external rotation    Knee flexion 3/5 4+/5  Knee extension 4-/5 4+/5  Ankle dorsiflexion    Ankle plantarflexion    Ankle inversion    Ankle eversion     (Blank rows = not tested)    GAIT: Distance walked: lobby to treatment area  Assistive device utilized: None Level of assistance: Complete Independence Comments: wide BOS, dec velocity, minor weight shift towards RLE in stance phase, symmetric stride length                                                                                                                                TREATMENT DATE:   [facility] Adult PT Treatment:                                                DATE:  08/12/24 Therapeutic Exercise: Recumbent bike x6 mins level 2, reduced to level 1 at 3.5 min mark due to reported fatigue  LAQ 8x10s BLE alternating between reps 3 way hip in standing x10 BLE with countertop support Repeated knee extension in sitting x10: no effect on symptoms of knee pain which developed with isometric knee extension LAQ with VMO bias 3x10 BLE  Sit to Stand 2x10 from elevated surface TKA in standing x10 BLE with green TB HEP revised and provided    07/31/24: EVAL    PATIENT EDUCATION:  Education details: Pt educated on relevant anatomy, physiology, pathology, diagnosis, prognosis, progression of care, pain and activity modification related to right knee pain Person educated: Patient Education method: Explanation, Demonstration, and Handouts Education comprehension: verbalized understanding and returned demonstration  HOME EXERCISE PROGRAM: Access Code: [email or web address] URL: [email or web address] Date: 08/13/2024 Prepared by: Stann Ohara  Exercises - Seated Quad Set  - 5-6 x daily - 7 x weekly - 1 sets - 10 reps - 2 hold - Side to Side Weight Shift with Counter Support  - 1 x daily - 7 x weekly - 1 sets - 30 reps - 2 hold - Standing 3-way Hip with Walker  - 1 x daily - 4 x weekly - 1 sets - 10 reps - 2 hold - Sit to  Stand with Armchair  - 1 x daily - 4 x weekly - 2 sets - 10 reps - Seated Long Arc Quad  - 1 x daily - 4 x weekly - 3 sets - 10 reps - 2 hold  ASSESSMENT:  CLINICAL IMPRESSION: Pt tolerated session well, established activity boundaries based on symptom response as well as progressed into acceptable response of produced, not worse response with interventions to allow for activity to advance with decreased risk of inflammation or adverse response. Cavitations noted in patella with knee extension, intermittent, and integrated VMO bias to quad strengthening due to inc mobility with M-L patellar glide. Pt stands to benefit from continued skilled  physical therapy to address deficit areas and restore safety with activities and participations at home and in the community.    EVAL: Patient is a 49 y.o. F who was seen today for physical therapy evaluation and treatment for R knee pain. Symptoms are consistent with mechanical impingement of the right knee. Potential for lumbar referral to the RLE based on ROM testing of the lumbar spine and RLE reproduced. Pt has been doing well since injection completed at MD visit 07/24/24. Will plan to initiate pt with 2 times per week fading to 1 time per week and inc pt independence when appropriate. Pt stands to benefit from continued skilled physical therapy to address deficit areas and restore safety with activities and participations at home and in the community.     OBJECTIVE IMPAIRMENTS: decreased activity tolerance, decreased endurance, difficulty walking, decreased ROM, decreased strength, increased edema, increased fascial restrictions, impaired perceived functional ability, and pain.   ACTIVITY LIMITATIONS: carrying, lifting, bending, standing, squatting, and stairs  PARTICIPATION LIMITATIONS: cleaning, laundry, driving, shopping, community activity, occupation, and yard work  PERSONAL FACTORS: Fitness, Past/current experiences, Profession, Time since onset of injury/illness/exacerbation, and 1 comorbidity: DM are also affecting patient's functional outcome.   REHAB POTENTIAL: Good  CLINICAL DECISION MAKING: Stable/uncomplicated  EVALUATION COMPLEXITY: Low   GOALS: Goals reviewed with patient? Yes  SHORT TERM GOALS: Target date: 08/28/24   Pt will report compliance with HEP to work towards ind and home management strategies Baseline: Goal status: INITIAL   2.  Pt will score no less than 30/80 on LEFS to demonstrate improved activity tolerance Baseline:  Goal status: INITIAL   3.  Pt will improve R knee ROM to full and painless in order to demonstrate progress towards activity  tolerance and improved function Baseline:  Goal status: INITIAL     LONG TERM GOALS: Target date: 09/25/24   Pt will score no less than 60/80 on LEFS to demonstrate improved activity tolerance Baseline:  Goal status: INITIAL   2.  Pt will report no greater than 2/10 pain over 7 consecutive days to demonstrate maintained reduction in symptoms and improved tolerance to activity Baseline:  Goal status: INITIAL   3.  Pt will be ind in the management of their symptoms at home and in the community Baseline:  Goal status: INITIAL      PLAN:  PT FREQUENCY: 1-2x/week  PT DURATION: 8 weeks  PLANNED INTERVENTIONS: 97110-Therapeutic exercises, 97530- Therapeutic activity, [id number]- Neuromuscular re-education, 97535- Self Care, 02859- Manual therapy, [id number]- Gait training, [id number]- Electrical stimulation (unattended), 97016- Vasopneumatic device, [id number]- Ionotophoresis 4mg /ml Dexamethasone , 79439 (1-2 muscles), 20561 (3+ muscles)- Dry Needling, Patient/Family education, Cryotherapy, and Moist heat  PLAN FOR NEXT SESSION: Modify HEP as needed, progress lower quarter strength, stability, endurance, mobility, motor control. Assess role of lumbar spine in functional deficits. Provider discretion.  Stann DELENA Ohara, PT 08/12/2024, 4:26 PM  "

## 2024-08-12 NOTE — Telephone Encounter (Signed)
 Pharmacy Patient Advocate Encounter- [place]  BIV-Medical Benefit:  J code: [id number]  CPT code: 03627  Dx Code: B20  PA was submitted to [employer] and has been approved through: 08/07/24-08/06/25 Authorization# [id number]

## 2024-08-13 ENCOUNTER — Other Ambulatory Visit: Payer: Self-pay

## 2024-08-13 ENCOUNTER — Encounter: Payer: Self-pay | Admitting: Pediatrics

## 2024-08-13 ENCOUNTER — Ambulatory Visit: Payer: Self-pay | Admitting: Pharmacist

## 2024-08-13 DIAGNOSIS — B20 Human immunodeficiency virus [HIV] disease: Secondary | ICD-10-CM

## 2024-08-13 MED ORDER — CABOTEGRAVIR & RILPIVIRINE ER 600 & 900 MG/3ML IM SUER
1.0000 | Freq: Once | INTRAMUSCULAR | Status: AC
  Administered 2024-08-13: 1 via INTRAMUSCULAR

## 2024-08-13 NOTE — Telephone Encounter (Signed)
 Start date of STD: 08/06/24 Duration: 8 weeks  Form completed, placed on Dr. Virgilio desk to review and sign.

## 2024-08-14 NOTE — Telephone Encounter (Signed)
 Called and spoke with patient, went over form. Plan to RTW 09/18/24. If sx have not improved, scheduled f/u OV for re-eval and can complete any necessary forms to continuation accommodations at that time. Pt verbalized understanding and requests that I copy of the form be e-mailed to her.

## 2024-08-14 NOTE — Telephone Encounter (Signed)
 Spoke with pt, she is going to drop an additional form off by the office for completion.

## 2024-08-14 NOTE — Telephone Encounter (Signed)
 Form completed, reviewed and signed by Dr. Alease Hunter, placed at the front desk for faxing/scanning.

## 2024-08-14 NOTE — Telephone Encounter (Signed)
 Form completed, reviewed and signed by Dr. Joane, faxed, and e-mail to pt via pt request, e-mail has been confirmed.   Will need to make a copy to send to scan.

## 2024-08-14 NOTE — Telephone Encounter (Signed)
 Form received

## 2024-08-18 ENCOUNTER — Ambulatory Visit

## 2024-08-20 ENCOUNTER — Ambulatory Visit

## 2024-08-20 NOTE — Therapy (Unsigned)
 " OUTPATIENT PHYSICAL THERAPY LOWER EXTREMITY TREATMENT   Patient Name: Tiffany Ray MRN: [id number] DOB:21-Nov-1975, 49 y.o., female Today's Date: 08/20/2024  END OF SESSION:     Past Medical History:  Diagnosis Date   Abnormal uterine bleeding (AUB)    Adnexal mass    Anemia    HIV infection (HCC)    Pneumonia    Pre-diabetes    Prediabetes    Type 2 diabetes mellitus (HCC) 10/06/2019   Past Surgical History:  Procedure Laterality Date   ECTOPIC PREGNANCY SURGERY     ROBOTIC ASSISTED SALPINGO OOPHERECTOMY Bilateral 07/08/2020   Procedure: XI ROBOTIC ASSISTED LEFT SALPINGO OOPHORECTOMY, RIGHT SALPINGECTOMY;  Surgeon: Viktoria Comer SAUNDERS, MD;  Location: [facility] [place];  Service: Gynecology;  Laterality: Bilateral;   TUBAL LIGATION     Patient Active Problem List   Diagnosis Date Noted   At increased risk for cardiovascular disease 06/09/2024   Wart of hand 06/09/2024   Chronic pain of right knee 01/27/2022   BMI 38.0-38.9,adult 10/03/2021   Numbness and tingling 09/15/2020   Post-op pain 07/30/2020   Other constipation 07/30/2020   Endometriosis    Pelvic mass in female 02/23/2020   Pelvic pain in female 02/23/2020   Menorrhagia with irregular cycle 11/04/2019   Screening for cervical cancer 11/04/2019   Type 2 diabetes mellitus (HCC) 10/06/2019   HIV disease (HCC) 09/13/2018   Healthcare maintenance 09/13/2018    PCP: No PCP  REFERRING PROVIDER: Joane Artist RAMAN, MD   REFERRING DIAG: [id number] (ICD-10-CM) - Chronic pain of right knee   Evaluate and Treat for knee pain 1-2 times per week for 4-6 weeks.   Decrease pain, increase strength, flexibility, function, and range of motion.  Modalities may include, traction, ionto, phono, stim, and dry needling prn.  THERAPY DIAG:  No diagnosis found.  Rationale for Evaluation and Treatment: Rehabilitation  ONSET DATE: 2-3 years ago (chronic), worsening   SUBJECTIVE:   SUBJECTIVE STATEMENT: Pt  states that she has been placed on medical leave from work due to dec tolerance to complete duties and will be given clearance from MD to return. Reports compliance with HEP. Currently 0/10 pain.   EVAL:  Pt states that she noted onset of R knee pain 2-3 years ago without MOI. 1-2 years ago started seeing sports medicine, was scheduled for MRI, declined by insurance. Symptoms are overall worsening in the last 6 months, notes instability in R knee, inc pain, difficulty walking. Pt works at [employer] and has inc time on her feet. Not currently working, will plan to return 08/04/24 with light duty x8 weeks. Symptoms range in intensity from 6/10-10/10 depending on activity or position, independent of time of day. Since the injection pt reports 0/10 pain. Denies paraesthesia. Pt endorses low back pain and edema in RLE, lower leg portion. Pt reports that she has some incontinence since September or October when symptoms of R knee pain exacerbated.   PERTINENT HISTORY: From referring provider visit 07/24/24:  49 y.o. female with right knee pain due to exacerbation of DJD.  There may be more sources of pain that are visible on x-ray.  Plan for injection today and physical therapy.  PT has already been ordered and is starting on January 12.  We also spent time talking about her work restrictions.  Plan to continue light duty.  Consider MRI if not improved.  Recheck after finishing PT.    PAIN:  Are you having pain? Yes: NPRS scale: 0/10 Pain location:  R knee Pain description: ache, unstable Aggravating factors: walking, prolonged activity Relieving factors: rest, unloading RLE, medication  PRECAUTIONS: None  RED FLAGS: Incontinent of bladder intermittently, able to identify need to go, unable to hold it if she cannot immediately get to the bathroom, denies saddle anesthesia, ataxic patterns   WEIGHT BEARING RESTRICTIONS: No  FALLS:  Has patient fallen in last 6 months? No  LIVING ENVIRONMENT: Lives with:  lives with their son and lives with their daughter Lives in: House/apartment Stairs: Yes: Internal: flight steps; on right going up Has following equipment at home: None  OCCUPATION: works at [employer], Administrator, Sports duties  PLOF: Independent  PATIENT GOALS: decrease, pain, improve tolerance to standing and walking  NEXT MD VISIT: Will plan to schedule with sports med following therapy, or as needed  OBJECTIVE:  Note: Objective measures were completed at Evaluation unless otherwise noted.  DIAGNOSTIC FINDINGS: none noted for referring dx  PATIENT SURVEYS:  LEFS  Extreme difficulty/unable (0), Quite a bit of difficulty (1), Moderate difficulty (2), Little difficulty (3), No difficulty (4) Survey date:  07/31/24  Any of your usual work, housework or school activities 0  2. Usual hobbies, recreational or sporting activities 0  3. Getting into/out of the bath 3  4. Walking between rooms 3  5. Putting on socks/shoes 3  6. Squatting  3  7. Lifting an object, like a bag of groceries from the floor 0  8. Performing light activities around your home 3  9. Performing heavy activities around your home 0  10. Getting into/out of a car 0  11. Walking 2 blocks 0  12. Walking 1 mile 0  13. Going up/down 10 stairs (1 flight) 0  14. Standing for 1 hour 0  15.  sitting for 1 hour 0  16. Running on even ground 0  17. Running on uneven ground 0  18. Making sharp turns while running fast 0  19. Hopping  0  20. Rolling over in bed 0  Score total:  15/80     COGNITION: Overall cognitive status: Within functional limits for tasks assessed     SENSATION: Hypersensitivity noted in RLE at L3 and S1 dermatome rated as 100% on RLE (more sensation), 50% on LLE at identical points.  EDEMA:  Joint line -5cm: 37cm Joint line:   42cm Joint line +5cm: 45cm   POSTURE: rounded shoulders and forward head  PALPATION:  Inc TTP at the joint line, no pitting with edema of R knee  LUMBAR  ROM: Flexion: 50% loss, pulling in anterior right leg Extension: 50% loss, pulling in anterior right leg Side glide right: nil loss Side glide left: nil loss  LOWER EXTREMITY ROM:  Active ROM Right eval Left eval  Hip flexion    Hip extension    Hip abduction    Hip adduction    Hip internal rotation    Hip external rotation    Knee flexion 90   Knee extension 0   Ankle dorsiflexion    Ankle plantarflexion    Ankle inversion    Ankle eversion     (Blank rows = not tested)  LOWER EXTREMITY MMT:  MMT Right eval Left eval  Hip flexion    Hip extension    Hip abduction    Hip adduction    Hip internal rotation    Hip external rotation    Knee flexion 3/5 4+/5  Knee extension 4-/5 4+/5  Ankle dorsiflexion    Ankle plantarflexion  Ankle inversion    Ankle eversion     (Blank rows = not tested)    GAIT: Distance walked: lobby to treatment area  Assistive device utilized: None Level of assistance: Complete Independence Comments: wide BOS, dec velocity, minor weight shift towards RLE in stance phase, symmetric stride length                                                                                                                                TREATMENT DATE:   [facility] Adult PT Treatment:                                                DATE: 08/12/24 Therapeutic Exercise: Recumbent bike x6 mins level 2, reduced to level 1 at 3.5 min mark due to reported fatigue  LAQ 8x10s BLE alternating between reps 3 way hip in standing x10 BLE with countertop support Repeated knee extension in sitting x10: no effect on symptoms of knee pain which developed with isometric knee extension LAQ with VMO bias 3x10 BLE  Sit to Stand 2x10 from elevated surface TKA in standing x10 BLE with green TB HEP revised and provided    07/31/24: EVAL    PATIENT EDUCATION:  Education details: Pt educated on relevant anatomy, physiology, pathology, diagnosis, prognosis, progression of  care, pain and activity modification related to right knee pain Person educated: Patient Education method: Explanation, Demonstration, and Handouts Education comprehension: verbalized understanding and returned demonstration  HOME EXERCISE PROGRAM: Access Code: [email or web address] URL: [email or web address] Date: 08/13/2024 Prepared by: Stann Ohara  Exercises - Seated Quad Set  - 5-6 x daily - 7 x weekly - 1 sets - 10 reps - 2 hold - Side to Side Weight Shift with Counter Support  - 1 x daily - 7 x weekly - 1 sets - 30 reps - 2 hold - Standing 3-way Hip with Walker  - 1 x daily - 4 x weekly - 1 sets - 10 reps - 2 hold - Sit to Stand with Armchair  - 1 x daily - 4 x weekly - 2 sets - 10 reps - Seated Long Arc Quad  - 1 x daily - 4 x weekly - 3 sets - 10 reps - 2 hold  ASSESSMENT:  CLINICAL IMPRESSION: Pt tolerated session well, established activity boundaries based on symptom response as well as progressed into acceptable response of produced, not worse response with interventions to allow for activity to advance with decreased risk of inflammation or adverse response. Cavitations noted in patella with knee extension, intermittent, and integrated VMO bias to quad strengthening due to inc mobility with M-L patellar glide. Pt stands to benefit from continued skilled physical therapy to address deficit areas and restore safety with activities and participations at home and in the community.    EVAL:  Patient is a 49 y.o. F who was seen today for physical therapy evaluation and treatment for R knee pain. Symptoms are consistent with mechanical impingement of the right knee. Potential for lumbar referral to the RLE based on ROM testing of the lumbar spine and RLE reproduced. Pt has been doing well since injection completed at MD visit 07/24/24. Will plan to initiate pt with 2 times per week fading to 1 time per week and inc pt independence when appropriate. Pt stands to benefit from continued  skilled physical therapy to address deficit areas and restore safety with activities and participations at home and in the community.     OBJECTIVE IMPAIRMENTS: decreased activity tolerance, decreased endurance, difficulty walking, decreased ROM, decreased strength, increased edema, increased fascial restrictions, impaired perceived functional ability, and pain.   ACTIVITY LIMITATIONS: carrying, lifting, bending, standing, squatting, and stairs  PARTICIPATION LIMITATIONS: cleaning, laundry, driving, shopping, community activity, occupation, and yard work  PERSONAL FACTORS: Fitness, Past/current experiences, Profession, Time since onset of injury/illness/exacerbation, and 1 comorbidity: DM are also affecting patient's functional outcome.   REHAB POTENTIAL: Good  CLINICAL DECISION MAKING: Stable/uncomplicated  EVALUATION COMPLEXITY: Low   GOALS: Goals reviewed with patient? Yes  SHORT TERM GOALS: Target date: 08/28/24   Pt will report compliance with HEP to work towards ind and home management strategies Baseline: Goal status: INITIAL   2.  Pt will score no less than 30/80 on LEFS to demonstrate improved activity tolerance Baseline:  Goal status: INITIAL   3.  Pt will improve R knee ROM to full and painless in order to demonstrate progress towards activity tolerance and improved function Baseline:  Goal status: INITIAL     LONG TERM GOALS: Target date: 09/25/24   Pt will score no less than 60/80 on LEFS to demonstrate improved activity tolerance Baseline:  Goal status: INITIAL   2.  Pt will report no greater than 2/10 pain over 7 consecutive days to demonstrate maintained reduction in symptoms and improved tolerance to activity Baseline:  Goal status: INITIAL   3.  Pt will be ind in the management of their symptoms at home and in the community Baseline:  Goal status: INITIAL      PLAN:  PT FREQUENCY: 1-2x/week  PT DURATION: 8 weeks  PLANNED INTERVENTIONS:  97110-Therapeutic exercises, 97530- Therapeutic activity, [id number]- Neuromuscular re-education, 97535- Self Care, [id number]- Manual therapy, [id number]- Gait training, [phone]- Electrical stimulation (unattended), [id number]- Vasopneumatic device, [id number]- Ionotophoresis 4mg /ml Dexamethasone , 79439 (1-2 muscles), 20561 (3+ muscles)- Dry Needling, Patient/Family education, Cryotherapy, and Moist heat  PLAN FOR NEXT SESSION: Modify HEP as needed, progress lower quarter strength, stability, endurance, mobility, motor control. Assess role of lumbar spine in functional deficits. Provider discretion.    Malai Lady M Zebastian Carico, PT 08/20/2024, 9:45 AM  "

## 2024-08-21 NOTE — Telephone Encounter (Signed)
 Form securely e-mailed to pt at [email or web address], per pt request.

## 2024-08-22 NOTE — Therapy (Unsigned)
 " OUTPATIENT PHYSICAL THERAPY LOWER EXTREMITY TREATMENT   Patient Name: Tiffany Ray MRN: [id number] DOB:09/25/1975, 49 y.o., female Today's Date: 08/22/2024  END OF SESSION:     Past Medical History:  Diagnosis Date   Abnormal uterine bleeding (AUB)    Adnexal mass    Anemia    HIV infection (HCC)    Pneumonia    Pre-diabetes    Prediabetes    Type 2 diabetes mellitus (HCC) 10/06/2019   Past Surgical History:  Procedure Laterality Date   ECTOPIC PREGNANCY SURGERY     ROBOTIC ASSISTED SALPINGO OOPHERECTOMY Bilateral 07/08/2020   Procedure: XI ROBOTIC ASSISTED LEFT SALPINGO OOPHORECTOMY, RIGHT SALPINGECTOMY;  Surgeon: Viktoria Comer SAUNDERS, MD;  Location: [facility] [place];  Service: Gynecology;  Laterality: Bilateral;   TUBAL LIGATION     Patient Active Problem List   Diagnosis Date Noted   At increased risk for cardiovascular disease 06/09/2024   Wart of hand 06/09/2024   Chronic pain of right knee 01/27/2022   BMI 38.0-38.9,adult 10/03/2021   Numbness and tingling 09/15/2020   Post-op pain 07/30/2020   Other constipation 07/30/2020   Endometriosis    Pelvic mass in female 02/23/2020   Pelvic pain in female 02/23/2020   Menorrhagia with irregular cycle 11/04/2019   Screening for cervical cancer 11/04/2019   Type 2 diabetes mellitus (HCC) 10/06/2019   HIV disease (HCC) 09/13/2018   Healthcare maintenance 09/13/2018    PCP: No PCP  REFERRING PROVIDER: Joane Artist RAMAN, MD   REFERRING DIAG: [phone] (ICD-10-CM) - Chronic pain of right knee   Evaluate and Treat for knee pain 1-2 times per week for 4-6 weeks.   Decrease pain, increase strength, flexibility, function, and range of motion.  Modalities may include, traction, ionto, phono, stim, and dry needling prn.  THERAPY DIAG:  No diagnosis found.  Rationale for Evaluation and Treatment: Rehabilitation  ONSET DATE: 2-3 years ago (chronic), worsening   SUBJECTIVE:   SUBJECTIVE STATEMENT: Pt  states that she has been placed on medical leave from work due to dec tolerance to complete duties and will be given clearance from MD to return. Reports compliance with HEP. Currently 0/10 pain.   EVAL:  Pt states that she noted onset of R knee pain 2-3 years ago without MOI. 1-2 years ago started seeing sports medicine, was scheduled for MRI, declined by insurance. Symptoms are overall worsening in the last 6 months, notes instability in R knee, inc pain, difficulty walking. Pt works at [employer] and has inc time on her feet. Not currently working, will plan to return 08/04/24 with light duty x8 weeks. Symptoms range in intensity from 6/10-10/10 depending on activity or position, independent of time of day. Since the injection pt reports 0/10 pain. Denies paraesthesia. Pt endorses low back pain and edema in RLE, lower leg portion. Pt reports that she has some incontinence since September or October when symptoms of R knee pain exacerbated.   PERTINENT HISTORY: From referring provider visit 07/24/24:  49 y.o. female with right knee pain due to exacerbation of DJD.  There may be more sources of pain that are visible on x-ray.  Plan for injection today and physical therapy.  PT has already been ordered and is starting on January 12.  We also spent time talking about her work restrictions.  Plan to continue light duty.  Consider MRI if not improved.  Recheck after finishing PT.    PAIN:  Are you having pain? Yes: NPRS scale: 0/10 Pain location:  R knee Pain description: ache, unstable Aggravating factors: walking, prolonged activity Relieving factors: rest, unloading RLE, medication  PRECAUTIONS: None  RED FLAGS: Incontinent of bladder intermittently, able to identify need to go, unable to hold it if she cannot immediately get to the bathroom, denies saddle anesthesia, ataxic patterns   WEIGHT BEARING RESTRICTIONS: No  FALLS:  Has patient fallen in last 6 months? No  LIVING ENVIRONMENT: Lives with:  lives with their son and lives with their daughter Lives in: House/apartment Stairs: Yes: Internal: flight steps; on right going up Has following equipment at home: None  OCCUPATION: works at [employer], Administrator, Sports duties  PLOF: Independent  PATIENT GOALS: decrease, pain, improve tolerance to standing and walking  NEXT MD VISIT: Will plan to schedule with sports med following therapy, or as needed  OBJECTIVE:  Note: Objective measures were completed at Evaluation unless otherwise noted.  DIAGNOSTIC FINDINGS: none noted for referring dx  PATIENT SURVEYS:  LEFS  Extreme difficulty/unable (0), Quite a bit of difficulty (1), Moderate difficulty (2), Little difficulty (3), No difficulty (4) Survey date:  07/31/24  Any of your usual work, housework or school activities 0  2. Usual hobbies, recreational or sporting activities 0  3. Getting into/out of the bath 3  4. Walking between rooms 3  5. Putting on socks/shoes 3  6. Squatting  3  7. Lifting an object, like a bag of groceries from the floor 0  8. Performing light activities around your home 3  9. Performing heavy activities around your home 0  10. Getting into/out of a car 0  11. Walking 2 blocks 0  12. Walking 1 mile 0  13. Going up/down 10 stairs (1 flight) 0  14. Standing for 1 hour 0  15.  sitting for 1 hour 0  16. Running on even ground 0  17. Running on uneven ground 0  18. Making sharp turns while running fast 0  19. Hopping  0  20. Rolling over in bed 0  Score total:  15/80     COGNITION: Overall cognitive status: Within functional limits for tasks assessed     SENSATION: Hypersensitivity noted in RLE at L3 and S1 dermatome rated as 100% on RLE (more sensation), 50% on LLE at identical points.  EDEMA:  Joint line -5cm: 37cm Joint line:   42cm Joint line +5cm: 45cm   POSTURE: rounded shoulders and forward head  PALPATION:  Inc TTP at the joint line, no pitting with edema of R knee  LUMBAR  ROM: Flexion: 50% loss, pulling in anterior right leg Extension: 50% loss, pulling in anterior right leg Side glide right: nil loss Side glide left: nil loss  LOWER EXTREMITY ROM:  Active ROM Right eval Left eval  Hip flexion    Hip extension    Hip abduction    Hip adduction    Hip internal rotation    Hip external rotation    Knee flexion 90   Knee extension 0   Ankle dorsiflexion    Ankle plantarflexion    Ankle inversion    Ankle eversion     (Blank rows = not tested)  LOWER EXTREMITY MMT:  MMT Right eval Left eval  Hip flexion    Hip extension    Hip abduction    Hip adduction    Hip internal rotation    Hip external rotation    Knee flexion 3/5 4+/5  Knee extension 4-/5 4+/5  Ankle dorsiflexion    Ankle plantarflexion  Ankle inversion    Ankle eversion     (Blank rows = not tested)    GAIT: Distance walked: lobby to treatment area  Assistive device utilized: None Level of assistance: Complete Independence Comments: wide BOS, dec velocity, minor weight shift towards RLE in stance phase, symmetric stride length                                                                                                                                TREATMENT DATE:   [facility] Adult PT Treatment:                                                DATE: 08/12/24 Therapeutic Exercise: Recumbent bike x6 mins level 2, reduced to level 1 at 3.5 min mark due to reported fatigue  LAQ 8x10s BLE alternating between reps 3 way hip in standing x10 BLE with countertop support Repeated knee extension in sitting x10: no effect on symptoms of knee pain which developed with isometric knee extension LAQ with VMO bias 3x10 BLE  Sit to Stand 2x10 from elevated surface TKA in standing x10 BLE with green TB HEP revised and provided    07/31/24: EVAL    PATIENT EDUCATION:  Education details: Pt educated on relevant anatomy, physiology, pathology, diagnosis, prognosis, progression of  care, pain and activity modification related to right knee pain Person educated: Patient Education method: Explanation, Demonstration, and Handouts Education comprehension: verbalized understanding and returned demonstration  HOME EXERCISE PROGRAM: Access Code: [email or web address] URL: [email or web address] Date: 08/13/2024 Prepared by: Stann Ohara  Exercises - Seated Quad Set  - 5-6 x daily - 7 x weekly - 1 sets - 10 reps - 2 hold - Side to Side Weight Shift with Counter Support  - 1 x daily - 7 x weekly - 1 sets - 30 reps - 2 hold - Standing 3-way Hip with Walker  - 1 x daily - 4 x weekly - 1 sets - 10 reps - 2 hold - Sit to Stand with Armchair  - 1 x daily - 4 x weekly - 2 sets - 10 reps - Seated Long Arc Quad  - 1 x daily - 4 x weekly - 3 sets - 10 reps - 2 hold  ASSESSMENT:  CLINICAL IMPRESSION: Pt tolerated session well, established activity boundaries based on symptom response as well as progressed into acceptable response of produced, not worse response with interventions to allow for activity to advance with decreased risk of inflammation or adverse response. Cavitations noted in patella with knee extension, intermittent, and integrated VMO bias to quad strengthening due to inc mobility with M-L patellar glide. Pt stands to benefit from continued skilled physical therapy to address deficit areas and restore safety with activities and participations at home and in the community.    EVAL:  Patient is a 49 y.o. F who was seen today for physical therapy evaluation and treatment for R knee pain. Symptoms are consistent with mechanical impingement of the right knee. Potential for lumbar referral to the RLE based on ROM testing of the lumbar spine and RLE reproduced. Pt has been doing well since injection completed at MD visit 07/24/24. Will plan to initiate pt with 2 times per week fading to 1 time per week and inc pt independence when appropriate. Pt stands to benefit from continued  skilled physical therapy to address deficit areas and restore safety with activities and participations at home and in the community.     OBJECTIVE IMPAIRMENTS: decreased activity tolerance, decreased endurance, difficulty walking, decreased ROM, decreased strength, increased edema, increased fascial restrictions, impaired perceived functional ability, and pain.   ACTIVITY LIMITATIONS: carrying, lifting, bending, standing, squatting, and stairs  PARTICIPATION LIMITATIONS: cleaning, laundry, driving, shopping, community activity, occupation, and yard work  PERSONAL FACTORS: Fitness, Past/current experiences, Profession, Time since onset of injury/illness/exacerbation, and 1 comorbidity: DM are also affecting patient's functional outcome.   REHAB POTENTIAL: Good  CLINICAL DECISION MAKING: Stable/uncomplicated  EVALUATION COMPLEXITY: Low   GOALS: Goals reviewed with patient? Yes  SHORT TERM GOALS: Target date: 08/28/24   Pt will report compliance with HEP to work towards ind and home management strategies Baseline: Goal status: INITIAL   2.  Pt will score no less than 30/80 on LEFS to demonstrate improved activity tolerance Baseline:  Goal status: INITIAL   3.  Pt will improve R knee ROM to full and painless in order to demonstrate progress towards activity tolerance and improved function Baseline:  Goal status: INITIAL     LONG TERM GOALS: Target date: 09/25/24   Pt will score no less than 60/80 on LEFS to demonstrate improved activity tolerance Baseline:  Goal status: INITIAL   2.  Pt will report no greater than 2/10 pain over 7 consecutive days to demonstrate maintained reduction in symptoms and improved tolerance to activity Baseline:  Goal status: INITIAL   3.  Pt will be ind in the management of their symptoms at home and in the community Baseline:  Goal status: INITIAL      PLAN:  PT FREQUENCY: 1-2x/week  PT DURATION: 8 weeks  PLANNED INTERVENTIONS:  97110-Therapeutic exercises, 97530- Therapeutic activity, [id number]- Neuromuscular re-education, 97535- Self Care, [id number]- Manual therapy, [id number]- Gait training, [phone]- Electrical stimulation (unattended), [id number]- Vasopneumatic device, [id number]- Ionotophoresis 4mg /ml Dexamethasone , 79439 (1-2 muscles), 20561 (3+ muscles)- Dry Needling, Patient/Family education, Cryotherapy, and Moist heat  PLAN FOR NEXT SESSION: Modify HEP as needed, progress lower quarter strength, stability, endurance, mobility, motor control. Assess role of lumbar spine in functional deficits. Provider discretion.    Debby Clyne M Floy Angert, PT 08/22/2024, 11:47 AM  "

## 2024-08-25 ENCOUNTER — Ambulatory Visit

## 2024-08-25 ENCOUNTER — Encounter

## 2024-08-27 ENCOUNTER — Ambulatory Visit

## 2024-09-08 ENCOUNTER — Encounter: Admitting: Pediatrics

## 2025-01-27 ENCOUNTER — Ambulatory Visit: Admitting: Physician Assistant
